# Patient Record
Sex: Female | Born: 1955 | Race: Black or African American | Hispanic: No | Marital: Married | Smoking: Never smoker
Health system: Southern US, Community
[De-identification: ages and names within clinical notes are randomized; demographics above are authoritative.]

## PROBLEM LIST (undated history)

## (undated) DIAGNOSIS — E119 Type 2 diabetes mellitus without complications: Secondary | ICD-10-CM

## (undated) DIAGNOSIS — D649 Anemia, unspecified: Secondary | ICD-10-CM

## (undated) DIAGNOSIS — F32A Depression, unspecified: Secondary | ICD-10-CM

## (undated) DIAGNOSIS — F419 Anxiety disorder, unspecified: Secondary | ICD-10-CM

## (undated) DIAGNOSIS — I10 Essential (primary) hypertension: Secondary | ICD-10-CM

## (undated) DIAGNOSIS — M1990 Unspecified osteoarthritis, unspecified site: Secondary | ICD-10-CM

## (undated) DIAGNOSIS — E7800 Pure hypercholesterolemia, unspecified: Secondary | ICD-10-CM

## (undated) DIAGNOSIS — K589 Irritable bowel syndrome without diarrhea: Secondary | ICD-10-CM

## (undated) HISTORY — DX: Anemia, unspecified: D64.9

## (undated) HISTORY — DX: Type 2 diabetes mellitus without complications: E11.9

## (undated) HISTORY — DX: Unspecified osteoarthritis, unspecified site: M19.90

## (undated) HISTORY — DX: Pure hypercholesterolemia, unspecified: E78.00

## (undated) HISTORY — DX: Depression, unspecified: F32.A

## (undated) HISTORY — DX: Essential (primary) hypertension: I10

## (undated) HISTORY — DX: Irritable bowel syndrome, unspecified: K58.9

## (undated) HISTORY — DX: Anxiety disorder, unspecified: F41.9

---

## 1994-01-19 HISTORY — PX: ABDOMINAL HYSTERECTOMY: SHX81

## 1998-02-20 ENCOUNTER — Other Ambulatory Visit: Admission: RE | Admit: 1998-02-20 | Discharge: 1998-02-20 | Payer: Self-pay | Admitting: Obstetrics

## 1998-02-28 ENCOUNTER — Ambulatory Visit (HOSPITAL_COMMUNITY): Admission: RE | Admit: 1998-02-28 | Discharge: 1998-02-28 | Payer: Self-pay | Admitting: *Deleted

## 1998-03-01 ENCOUNTER — Ambulatory Visit (HOSPITAL_COMMUNITY): Admission: RE | Admit: 1998-03-01 | Discharge: 1998-03-01 | Payer: Self-pay | Admitting: *Deleted

## 1998-04-02 ENCOUNTER — Inpatient Hospital Stay (HOSPITAL_COMMUNITY): Admission: RE | Admit: 1998-04-02 | Discharge: 1998-04-05 | Payer: Self-pay | Admitting: Obstetrics

## 1998-04-13 ENCOUNTER — Inpatient Hospital Stay (HOSPITAL_COMMUNITY): Admission: AD | Admit: 1998-04-13 | Discharge: 1998-04-15 | Payer: Self-pay | Admitting: Obstetrics

## 1998-04-14 ENCOUNTER — Encounter: Payer: Self-pay | Admitting: Obstetrics

## 1998-04-25 ENCOUNTER — Emergency Department (HOSPITAL_COMMUNITY): Admission: EM | Admit: 1998-04-25 | Discharge: 1998-04-25 | Payer: Self-pay | Admitting: Emergency Medicine

## 1998-04-25 ENCOUNTER — Encounter: Payer: Self-pay | Admitting: Emergency Medicine

## 2004-08-05 ENCOUNTER — Other Ambulatory Visit: Admission: RE | Admit: 2004-08-05 | Discharge: 2004-08-05 | Payer: Self-pay | Admitting: Obstetrics and Gynecology

## 2004-08-18 ENCOUNTER — Ambulatory Visit (HOSPITAL_COMMUNITY): Admission: RE | Admit: 2004-08-18 | Discharge: 2004-08-18 | Payer: Self-pay | Admitting: Obstetrics and Gynecology

## 2009-08-06 ENCOUNTER — Encounter: Admission: RE | Admit: 2009-08-06 | Discharge: 2009-08-06 | Payer: Self-pay | Admitting: Internal Medicine

## 2014-01-01 ENCOUNTER — Ambulatory Visit
Admission: RE | Admit: 2014-01-01 | Discharge: 2014-01-01 | Disposition: A | Discharge status: Home / Self Care | Payer: PRIVATE HEALTH INSURANCE | Source: Ambulatory Visit | Attending: Family | Admitting: Family

## 2014-01-01 ENCOUNTER — Other Ambulatory Visit: Payer: Self-pay | Admitting: Family

## 2014-01-01 DIAGNOSIS — M79672 Pain in left foot: Secondary | ICD-10-CM

## 2014-01-01 DIAGNOSIS — M79671 Pain in right foot: Secondary | ICD-10-CM

## 2014-03-30 ENCOUNTER — Other Ambulatory Visit: Payer: Self-pay

## 2014-03-30 DIAGNOSIS — Z1231 Encounter for screening mammogram for malignant neoplasm of breast: Secondary | ICD-10-CM

## 2014-04-16 ENCOUNTER — Ambulatory Visit
Admission: RE | Admit: 2014-04-16 | Discharge: 2014-04-16 | Disposition: A | Discharge status: Home / Self Care | Payer: PRIVATE HEALTH INSURANCE | Source: Ambulatory Visit

## 2014-04-16 DIAGNOSIS — Z1231 Encounter for screening mammogram for malignant neoplasm of breast: Secondary | ICD-10-CM

## 2016-07-30 ENCOUNTER — Other Ambulatory Visit: Payer: Self-pay | Admitting: Internal Medicine

## 2016-07-30 DIAGNOSIS — E2839 Other primary ovarian failure: Secondary | ICD-10-CM

## 2016-09-28 ENCOUNTER — Other Ambulatory Visit: Payer: Self-pay | Admitting: Internal Medicine

## 2016-09-28 DIAGNOSIS — Z1231 Encounter for screening mammogram for malignant neoplasm of breast: Secondary | ICD-10-CM

## 2016-10-13 ENCOUNTER — Ambulatory Visit
Admission: RE | Admit: 2016-10-13 | Discharge: 2016-10-13 | Disposition: A | Discharge status: Home / Self Care | Payer: PRIVATE HEALTH INSURANCE | Source: Ambulatory Visit | Attending: Internal Medicine | Admitting: Internal Medicine

## 2016-10-13 ENCOUNTER — Encounter: Payer: Self-pay | Admitting: Radiology

## 2016-10-13 DIAGNOSIS — Z1231 Encounter for screening mammogram for malignant neoplasm of breast: Secondary | ICD-10-CM

## 2016-10-13 DIAGNOSIS — E2839 Other primary ovarian failure: Secondary | ICD-10-CM

## 2016-11-10 LAB — LIPID PANEL
Cholesterol: 249 — AB (ref 0–200)
HDL: 74 — AB (ref 35–70)
LDL CALC: 156
LDL/HDL RATIO: 2.1
Triglycerides: 93 (ref 40–160)

## 2016-11-10 LAB — VITAMIN D 25 HYDROXY (VIT D DEFICIENCY, FRACTURES): VIT D 25 HYDROXY: 36.9

## 2016-11-10 LAB — HM HEPATITIS C SCREENING LAB: HM HEPATITIS C SCREENING: NEGATIVE

## 2017-08-16 LAB — BASIC METABOLIC PANEL
BUN: 15 (ref 4–21)
Creatinine: 0.9 (ref 0.5–1.1)
Glucose: 68
Potassium: 4.6 (ref 3.4–5.3)
SODIUM: 142 (ref 137–147)

## 2017-08-16 LAB — HEMOGLOBIN A1C: HEMOGLOBIN A1C: 6.4

## 2017-12-15 ENCOUNTER — Encounter: Payer: Self-pay | Admitting: Internal Medicine

## 2017-12-15 ENCOUNTER — Ambulatory Visit (INDEPENDENT_AMBULATORY_CARE_PROVIDER_SITE_OTHER): Payer: PRIVATE HEALTH INSURANCE | Admitting: Internal Medicine

## 2017-12-15 VITALS — BP 136/84 | HR 81 | Temp 98.1°F | Ht 61.75 in | Wt 193.4 lb

## 2017-12-15 DIAGNOSIS — Z6835 Body mass index (BMI) 35.0-35.9, adult: Secondary | ICD-10-CM

## 2017-12-15 DIAGNOSIS — E7800 Pure hypercholesterolemia, unspecified: Secondary | ICD-10-CM | POA: Diagnosis not present

## 2017-12-15 DIAGNOSIS — I10 Essential (primary) hypertension: Secondary | ICD-10-CM

## 2017-12-15 DIAGNOSIS — E1165 Type 2 diabetes mellitus with hyperglycemia: Secondary | ICD-10-CM | POA: Diagnosis not present

## 2017-12-16 LAB — LIPID PANEL
Chol/HDL Ratio: 3.2 ratio (ref 0.0–4.4)
Cholesterol, Total: 176 mg/dL (ref 100–199)
HDL: 55 mg/dL (ref >39)
LDL Calculated: 70 mg/dL (ref 0–99)
Triglycerides: 254 mg/dL — ABNORMAL HIGH (ref 0–149)
VLDL Cholesterol Cal: 51 mg/dL — ABNORMAL HIGH (ref 5–40)

## 2017-12-16 LAB — CMP14+EGFR
A/G RATIO: 1.7 (ref 1.2–2.2)
ALK PHOS: 75 IU/L (ref 39–117)
ALT: 20 IU/L (ref 0–32)
AST: 17 IU/L (ref 0–40)
Albumin: 4.5 g/dL (ref 3.6–4.8)
BILIRUBIN TOTAL: 0.4 mg/dL (ref 0.0–1.2)
BUN/Creatinine Ratio: 20 (ref 12–28)
BUN: 18 mg/dL (ref 8–27)
CHLORIDE: 105 mmol/L (ref 96–106)
CO2: 21 mmol/L (ref 20–29)
Calcium: 9.5 mg/dL (ref 8.7–10.3)
Creatinine, Ser: 0.89 mg/dL (ref 0.57–1.00)
GFR calc Af Amer: 79 mL/min/{1.73_m2} (ref >59)
GFR calc non Af Amer: 69 mL/min/{1.73_m2} (ref >59)
GLOBULIN, TOTAL: 2.7 g/dL (ref 1.5–4.5)
Glucose: 76 mg/dL (ref 65–99)
Potassium: 4.3 mmol/L (ref 3.5–5.2)
SODIUM: 145 mmol/L — AB (ref 134–144)
Total Protein: 7.2 g/dL (ref 6.0–8.5)

## 2017-12-16 LAB — HEMOGLOBIN A1C
ESTIMATED AVERAGE GLUCOSE: 131 mg/dL
HEMOGLOBIN A1C: 6.2 % — AB (ref 4.8–5.6)

## 2017-12-18 NOTE — Progress Notes (Signed)
Your liver and kidney function are stable. Your sodium level is sl. Elevated, this implies that you are not drinking enough water. Your hba1c is 6.2, this is great.  Your triglycerides are elevated, this implies that you are NOT exercising enough and eating too many processed foods. pls cut out deli meat, cut back on sausage and bacon and exercise 30 minutes no less than five days weekly

## 2017-12-19 ENCOUNTER — Encounter: Payer: Self-pay | Admitting: Internal Medicine

## 2017-12-19 NOTE — Progress Notes (Signed)
Subjective:     Patient ID: Whitney Sanchez , female    DOB: 07-12-1955 , 62 y.o.   MRN: [id number]   Chief Complaint  Patient presents with  . Diabetes  . Hypertension    HPI  Diabetes  She presents for her follow-up diabetic visit. She has type 2 diabetes mellitus. Her disease course has been stable. There are no hypoglycemic associated symptoms. There are no diabetic associated symptoms. Pertinent negatives for diabetes include no blurred vision and no chest pain. There are no hypoglycemic complications. There are no diabetic complications. Risk factors for coronary artery disease include diabetes mellitus, dyslipidemia, hypertension, obesity, sedentary lifestyle and post-menopausal. Current diabetic treatment includes oral agent (monotherapy).  Hypertension  This is a chronic problem. The current episode started more than 1 year ago. The problem has been gradually improving since onset. The problem is controlled. Pertinent negatives include no blurred vision, chest pain, palpitations or shortness of breath.   She reports compliance with medication.   History reviewed. No pertinent past medical history.   Family History  Problem Relation Age of Onset  . Diabetes Mother   . Breast cancer Neg Hx      Current Outpatient Medications:  .  LIVALO 4 MG TABS, , Disp: , Rfl: 5 .  losartan (COZAAR) 100 MG tablet, , Disp: , Rfl: 1 .  OZEMPIC, 0.25 OR 0.5 MG/DOSE, 2 MG/1.5ML SOPN, , Disp: , Rfl: 1   No Known Allergies   Review of Systems  Constitutional: Negative.   Eyes: Negative for blurred vision.  Respiratory: Negative.  Negative for shortness of breath.   Cardiovascular: Negative.  Negative for chest pain and palpitations.  Gastrointestinal: Negative.   Genitourinary: Negative.   Neurological: Negative.   Psychiatric/Behavioral: Negative.      Today's Vitals   12/15/17 1411  BP: 136/84  Pulse: 81  Temp: 98.1 F (36.7 C)  TempSrc: Oral  Weight: 193 lb 6.4 oz (87.7  kg)  Height: 5' 1.75" (1.568 m)   Body mass index is 35.66 kg/m.   Objective:  Physical Exam  Constitutional: She is oriented to person, place, and time. She appears well-developed and well-nourished.  HENT:  Head: Normocephalic and atraumatic.  Eyes: EOM are normal.  Cardiovascular: Normal rate, regular rhythm and normal heart sounds.  Pulmonary/Chest: Effort normal and breath sounds normal.  Neurological: She is alert and oriented to person, place, and time.  Psychiatric: She has a normal mood and affect.  Nursing note and vitals reviewed.       Assessment And Plan:     1. Uncontrolled type 2 diabetes mellitus with hyperglycemia ([place])  She will continue with Ozempic once weekly. I will consider increasing her dose to 22m once weekly after reviewing her lab results. She is encouraged to avoid sugary beverages and processed foods.   - OZEMPIC, 0.25 OR 0.5 MG/DOSE, 2 MG/1.5ML SOPN; Refill: 1 - CMP14+EGFR - Hemoglobin A1c  2. Essential hypertension, benign  Fair control. She will continue with current meds for now. If elevated at next visit, I will consider adding hctz to her current regimen. She is encouraged to avoid adding salt to her foods.   - losartan (COZAAR) 100 MG tablet; Refill: 1  3. Class 2 severe obesity due to excess calories with serious comorbidity and body mass index (BMI) of 35.0 to 35.9 in adult ([facility]  She is encouraged to strive for BMI less than 30 to decrease cardiac risk. She is encouraged to exercise no less than  four to five days weekly for 30 minutes.   4. Pure hypercholesterolemia  Chronic. She has not tolerated other statins in the past. She was given refill of Livalo.   - LIVALO 4 MG TABS; Refill: 5       [name], MD

## 2017-12-31 ENCOUNTER — Telehealth: Payer: Self-pay

## 2017-12-31 NOTE — Telephone Encounter (Signed)
-----   Message from Dorothyann Pengobyn Sanders, MD sent at 12/18/2017 12:03 PM EST ----- Your liver and kidney function are stable. Your sodium level is sl. Elevated, this implies that you are not drinking enough water. Your hba1c is 6.2, this is great.  Your triglycerides are elevated, this implies that you are NOT exercising enough and eating too many processed foods. pls cut out deli meat, cut back on sausage and bacon and exercise 30 minutes no less than five days weekly

## 2017-12-31 NOTE — Telephone Encounter (Signed)
Left the pt a message to call back for her lab results. 

## 2018-03-07 ENCOUNTER — Telehealth: Payer: Self-pay | Admitting: Internal Medicine

## 2018-03-07 NOTE — Telephone Encounter (Signed)
[place] APPROVED FOR LIVALO 4MG  TABLET-KEY#[id number]

## 2018-03-17 ENCOUNTER — Ambulatory Visit: Payer: PRIVATE HEALTH INSURANCE | Admitting: Internal Medicine

## 2018-03-17 ENCOUNTER — Encounter: Payer: Self-pay | Admitting: Internal Medicine

## 2018-03-17 VITALS — BP 154/80 | HR 86 | Temp 97.9°F | Ht 62.8 in | Wt 192.8 lb

## 2018-03-17 DIAGNOSIS — E7800 Pure hypercholesterolemia, unspecified: Secondary | ICD-10-CM | POA: Insufficient documentation

## 2018-03-17 DIAGNOSIS — E6609 Other obesity due to excess calories: Secondary | ICD-10-CM | POA: Insufficient documentation

## 2018-03-17 DIAGNOSIS — I10 Essential (primary) hypertension: Secondary | ICD-10-CM | POA: Insufficient documentation

## 2018-03-17 DIAGNOSIS — N760 Acute vaginitis: Secondary | ICD-10-CM

## 2018-03-17 DIAGNOSIS — E66811 Obesity, class 1: Secondary | ICD-10-CM | POA: Insufficient documentation

## 2018-03-17 DIAGNOSIS — E1165 Type 2 diabetes mellitus with hyperglycemia: Secondary | ICD-10-CM | POA: Diagnosis not present

## 2018-03-17 DIAGNOSIS — Z6835 Body mass index (BMI) 35.0-35.9, adult: Secondary | ICD-10-CM

## 2018-03-17 DIAGNOSIS — F419 Anxiety disorder, unspecified: Secondary | ICD-10-CM

## 2018-03-17 DIAGNOSIS — Z6834 Body mass index (BMI) 34.0-34.9, adult: Secondary | ICD-10-CM | POA: Insufficient documentation

## 2018-03-17 MED ORDER — SERTRALINE HCL 25 MG PO TABS: ORAL_TABLET | ORAL | 0 refills | Status: DC

## 2018-03-17 MED ORDER — FLUCONAZOLE 150 MG PO TABS: 150.0000 mg | ORAL_TABLET | Freq: Every day | ORAL | 0 refills | Status: DC

## 2018-03-17 MED ORDER — SEMAGLUTIDE (1 MG/DOSE) 2 MG/1.5ML ~~LOC~~ SOPN: PEN_INJECTOR | SUBCUTANEOUS | 3 refills | Status: DC

## 2018-03-17 NOTE — Patient Instructions (Signed)
Diabetes Mellitus and Exercise Exercising regularly is important for your overall health, especially when you have diabetes (diabetes mellitus). Exercising is not only about losing weight. It has many other health benefits, such as increasing muscle strength and bone density and reducing body fat and stress. This leads to improved fitness, flexibility, and endurance, all of which result in better overall health. Exercise has additional benefits for people with diabetes, including:  Reducing appetite.  Helping to lower and control blood glucose.  Lowering blood pressure.  Helping to control amounts of fatty substances (lipids) in the blood, such as cholesterol and triglycerides.  Helping the body to respond better to insulin (improving insulin sensitivity).  Reducing how much insulin the body needs.  Decreasing the risk for heart disease by: ? Lowering cholesterol and triglyceride levels. ? Increasing the levels of good cholesterol. ? Lowering blood glucose levels. What is my activity plan? Your health care provider or certified diabetes educator can help you make a plan for the type and frequency of exercise (activity plan) that works for you. Make sure that you:  Do at least 150 minutes of moderate-intensity or vigorous-intensity exercise each week. This could be brisk walking, biking, or water aerobics. ? Do stretching and strength exercises, such as yoga or weightlifting, at least 2 times a week. ? Spread out your activity over at least 3 days of the week.  Get some form of physical activity every day. ? Do not go more than 2 days in a row without some kind of physical activity. ? Avoid being inactive for more than 30 minutes at a time. Take frequent breaks to walk or stretch.  Choose a type of exercise or activity that you enjoy, and set realistic goals.  Start slowly, and gradually increase the intensity of your exercise over time. What do I need to know about managing my  diabetes?   Check your blood glucose before and after exercising. ? If your blood glucose is 240 mg/dL (13.3 mmol/L) or higher before you exercise, check your urine for ketones. If you have ketones in your urine, do not exercise until your blood glucose returns to normal. ? If your blood glucose is 100 mg/dL (5.6 mmol/L) or lower, eat a snack containing 15-20 grams of carbohydrate. Check your blood glucose 15 minutes after the snack to make sure that your level is above 100 mg/dL (5.6 mmol/L) before you start your exercise.  Know the symptoms of low blood glucose (hypoglycemia) and how to treat it. Your risk for hypoglycemia increases during and after exercise. Common symptoms of hypoglycemia can include: ? Hunger. ? Anxiety. ? Sweating and feeling clammy. ? Confusion. ? Dizziness or feeling light-headed. ? Increased heart rate or palpitations. ? Blurry vision. ? Tingling or numbness around the mouth, lips, or tongue. ? Tremors or shakes. ? Irritability.  Keep a rapid-acting carbohydrate snack available before, during, and after exercise to help prevent or treat hypoglycemia.  Avoid injecting insulin into areas of the body that are going to be exercised. For example, avoid injecting insulin into: ? The arms, when playing tennis. ? The legs, when jogging.  Keep records of your exercise habits. Doing this can help you and your health care provider adjust your diabetes management plan as needed. Write down: ? Food that you eat before and after you exercise. ? Blood glucose levels before and after you exercise. ? The type and amount of exercise you have done. ? When your insulin is expected to peak, if you use   insulin. Avoid exercising at times when your insulin is peaking.  When you start a new exercise or activity, work with your health care provider to make sure the activity is safe for you, and to adjust your insulin, medicines, or food intake as needed.  Drink plenty of water while  you exercise to prevent dehydration or heat stroke. Drink enough fluid to keep your urine clear or pale yellow. Summary  Exercising regularly is important for your overall health, especially when you have diabetes (diabetes mellitus).  Exercising has many health benefits, such as increasing muscle strength and bone density and reducing body fat and stress.  Your health care provider or certified diabetes educator can help you make a plan for the type and frequency of exercise (activity plan) that works for you.  When you start a new exercise or activity, work with your health care provider to make sure the activity is safe for you, and to adjust your insulin, medicines, or food intake as needed. This information is not intended to replace advice given to you by your health care provider. Make sure you discuss any questions you have with your health care provider. Document Released: 03/28/2003 Document Revised: 07/16/2016 Document Reviewed: 06/17/2015 Elsevier Interactive Patient Education  2019 Elsevier Inc.  

## 2018-03-17 NOTE — Progress Notes (Signed)
Subjective:     Patient ID: Whitney Sanchez , female    DOB: 03-08-1955 , 63 y.o.   MRN: [id number]   Chief Complaint  Patient presents with  . Diabetes    HPI  Diabetes  She presents for her follow-up diabetic visit. She has type 2 diabetes mellitus. Hypoglycemia symptoms include nervousness/anxiousness. Pertinent negatives for diabetes include no blurred vision and no chest pain. There are no hypoglycemic complications. Risk factors for coronary artery disease include diabetes mellitus, dyslipidemia, hypertension, sedentary lifestyle, post-menopausal and obesity. An ACE inhibitor/angiotensin II receptor blocker is being taken.  Hypertension  This is a chronic problem. The current episode started more than 1 year ago. The problem has been gradually improving since onset. The problem is uncontrolled. Pertinent negatives include no blurred vision or chest pain.  She has yet to take her meds today. She has not eaten.    Past Medical History:  Diagnosis Date  . DM (diabetes mellitus) ([place])   . High cholesterol   . HTN (hypertension)      Family History  Problem Relation Age of Onset  . Diabetes Mother   . Breast cancer Neg Hx      Current Outpatient Medications:  .  LIVALO 4 MG TABS, , Disp: , Rfl: 5 .  losartan (COZAAR) 100 MG tablet, , Disp: , Rfl: 1 .  OZEMPIC, 0.25 OR 0.5 MG/DOSE, 2 MG/1.5ML SOPN, , Disp: , Rfl: 1 .  fluconazole (DIFLUCAN) 150 MG tablet, Take 1 tablet (150 mg total) by mouth daily., Disp: 1 tablet, Rfl: 0   No Known Allergies   Review of Systems  Constitutional: Negative.   Eyes: Negative for blurred vision.  Respiratory: Negative.   Cardiovascular: Negative.  Negative for chest pain.  Gastrointestinal: Negative.   Genitourinary: Positive for vaginal discharge (she c/o vaginal itch. tried otc meds, no relief. clear d/c).  Neurological: Negative.   Psychiatric/Behavioral: The patient is nervous/anxious.      Today's Vitals   03/17/18 1052  BP:  (!) 154/80  Pulse: 86  Temp: 97.9 F (36.6 C)  TempSrc: Oral  Weight: 192 lb 12.8 oz (87.5 kg)  Height: 5' 2.8" (1.595 m)  PainSc: 0-No pain   Body mass index is 34.37 kg/m.   Objective:  Physical Exam Vitals signs and nursing note reviewed.  Constitutional:      Appearance: Normal appearance.  HENT:     Head: Normocephalic and atraumatic.  Cardiovascular:     Rate and Rhythm: Normal rate and regular rhythm.     Heart sounds: Normal heart sounds.  Pulmonary:     Effort: Pulmonary effort is normal.     Breath sounds: Normal breath sounds.  Skin:    General: Skin is warm.  Neurological:     General: No focal deficit present.     Mental Status: She is alert.  Psychiatric:        Mood and Affect: Mood normal.        Behavior: Behavior normal.         Assessment And Plan:     1. Uncontrolled type 2 diabetes mellitus with hyperglycemia (HCC)  I will increase her Ozempic to '1mg'$  once weekly. She will continue to inject on Mondays.  - CMP14+EGFR - Hemoglobin A1c  2. Essential hypertension, benign  Uncontrolled. She has yet to take her meds today. She has not eaten. She will continue with current meds.   3. Pure hypercholesterolemia  She was given samples of Livalo '4mg'$  to  take once daily. Again, importance of regular exercise was discussed with the patient.   4. Acute vaginitis  She was given rx fluconazole 150m po x 1.   5. Anxiety  I will start her with zoloft 249mx 7 days, then she will increase to 2 tablets daily. She is advised to take with her evening meal. Possible side effects were discussed with the patient. She will rto in four weeks for re-evaluation.   6. Class 2 severe obesity due to excess calories with serious comorbidity and body mass index (BMI) of 35.0 to 35.9 in adult [facility] Importance of achieving optimal weight to decrease risk of cardiovascular disease and cancers was discussed with the patient in full detail. She is encouraged to start  slowly - start with 10 minutes twice daily at least three to four days per week and to gradually build to 30 minutes five days weekly. She was given tips to incorporate more activity into her daily routine - take stairs when possible, park farther away from her job, grocery stores, etc.    RoMaximino GreenlandMD

## 2018-03-18 ENCOUNTER — Telehealth: Payer: Self-pay

## 2018-03-18 LAB — CMP14+EGFR
ALBUMIN: 4.8 g/dL (ref 3.8–4.8)
ALK PHOS: 89 IU/L (ref 39–117)
ALT: 19 IU/L (ref 0–32)
AST: 19 IU/L (ref 0–40)
Albumin/Globulin Ratio: 1.8 (ref 1.2–2.2)
BUN / CREAT RATIO: 16 (ref 12–28)
BUN: 14 mg/dL (ref 8–27)
Bilirubin Total: 0.4 mg/dL (ref 0.0–1.2)
CALCIUM: 9.9 mg/dL (ref 8.7–10.3)
CO2: 18 mmol/L — AB (ref 20–29)
Chloride: 106 mmol/L (ref 96–106)
Creatinine, Ser: 0.86 mg/dL (ref 0.57–1.00)
GFR, EST AFRICAN AMERICAN: 83 mL/min/{1.73_m2} (ref >59)
GFR, EST NON AFRICAN AMERICAN: 72 mL/min/{1.73_m2} (ref >59)
GLOBULIN, TOTAL: 2.7 g/dL (ref 1.5–4.5)
Glucose: 101 mg/dL — ABNORMAL HIGH (ref 65–99)
Potassium: 4.4 mmol/L (ref 3.5–5.2)
SODIUM: 143 mmol/L (ref 134–144)
TOTAL PROTEIN: 7.5 g/dL (ref 6.0–8.5)

## 2018-03-18 LAB — HEMOGLOBIN A1C
Est. average glucose Bld gHb Est-mCnc: 126 mg/dL
Hgb A1c MFr Bld: 6 % — ABNORMAL HIGH (ref 4.8–5.6)

## 2018-03-18 NOTE — Telephone Encounter (Signed)
I returned the pt's call to see what medication her pharmacy said that's not covered under her insurance and to let her know that the livalo is approved for coverage.

## 2018-03-21 ENCOUNTER — Other Ambulatory Visit: Payer: Self-pay

## 2018-03-21 MED ORDER — SERTRALINE HCL 25 MG PO TABS: ORAL_TABLET | ORAL | 0 refills | Status: DC

## 2018-03-22 ENCOUNTER — Other Ambulatory Visit: Payer: Self-pay

## 2018-03-22 MED ORDER — SERTRALINE HCL 25 MG PO TABS: ORAL_TABLET | ORAL | 0 refills | Status: DC

## 2018-03-23 ENCOUNTER — Other Ambulatory Visit: Payer: Self-pay | Admitting: Internal Medicine

## 2018-03-23 DIAGNOSIS — I10 Essential (primary) hypertension: Secondary | ICD-10-CM

## 2018-03-25 ENCOUNTER — Telehealth: Payer: Self-pay

## 2018-03-25 NOTE — Telephone Encounter (Signed)
-----   Message from [name], MD sent at 03/19/2018  2:33 PM EST ----- Your liver and kidney fxn are stable. Your hba1c is great. 6.0. down from 6.2.

## 2018-03-25 NOTE — Telephone Encounter (Signed)
No answer, I was calling the pt with her lab results.

## 2018-04-02 ENCOUNTER — Other Ambulatory Visit: Payer: Self-pay | Admitting: Internal Medicine

## 2018-04-02 DIAGNOSIS — E7800 Pure hypercholesterolemia, unspecified: Secondary | ICD-10-CM

## 2018-04-14 ENCOUNTER — Encounter: Payer: Self-pay | Admitting: Internal Medicine

## 2018-04-14 ENCOUNTER — Ambulatory Visit (INDEPENDENT_AMBULATORY_CARE_PROVIDER_SITE_OTHER): Payer: PRIVATE HEALTH INSURANCE | Admitting: Internal Medicine

## 2018-04-14 ENCOUNTER — Other Ambulatory Visit: Payer: Self-pay

## 2018-04-14 VITALS — BP 118/76 | HR 77 | Temp 98.7°F | Ht 62.8 in | Wt 190.2 lb

## 2018-04-14 DIAGNOSIS — E6609 Other obesity due to excess calories: Secondary | ICD-10-CM | POA: Diagnosis not present

## 2018-04-14 DIAGNOSIS — F419 Anxiety disorder, unspecified: Secondary | ICD-10-CM

## 2018-04-14 DIAGNOSIS — Z6833 Body mass index (BMI) 33.0-33.9, adult: Secondary | ICD-10-CM | POA: Diagnosis not present

## 2018-04-14 DIAGNOSIS — E1165 Type 2 diabetes mellitus with hyperglycemia: Secondary | ICD-10-CM

## 2018-04-14 MED ORDER — SERTRALINE HCL 25 MG PO TABS: ORAL_TABLET | ORAL | 0 refills | Status: DC

## 2018-04-14 MED ORDER — ASPIRIN EC 81 MG PO TBEC: 81.0000 mg | DELAYED_RELEASE_TABLET | Freq: Every day | ORAL | 3 refills | Status: AC

## 2018-04-14 NOTE — Progress Notes (Signed)
Subjective:     Patient ID: Whitney Sanchez , female    DOB: 1955-09-10 , 63 y.o.   MRN: [id number]   Chief Complaint  Patient presents with  . Anxiety    HPI  Anxiety  Presents for follow-up visit. Patient reports no chest pain, feeling of choking, muscle tension, palpitations or shortness of breath.     She was started on sertraline 25mg  daily at her last visit. Unfortunately, she states she has been taking as needed.  She admits she did not read the bottle.   Past Medical History:  Diagnosis Date  . DM (diabetes mellitus) ([facility])   . High cholesterol   . HTN (hypertension)      Family History  Problem Relation Age of Onset  . Diabetes Mother   . Healthy Father   . Breast cancer Neg Hx      Current Outpatient Medications:  .  LIVALO 4 MG TABS, TAKE ONE TABLET BY MOUTH DAILY, Disp: 30 tablet, Rfl: 4 .  losartan (COZAAR) 100 MG tablet, TAKE ONE TABLET BY MOUTH DAILY, Disp: 30 tablet, Rfl: 0 .  Semaglutide, 1 MG/DOSE, (OZEMPIC, 1 MG/DOSE,) 2 MG/1.5ML SOPN, Inject 1 mg into the skin once a week for 30 days, THEN 1 mg once a week for 30 days., Disp: 1 pen, Rfl: 3 .  sertraline (ZOLOFT) 25 MG tablet, Take 1/2 tablet by mouth for 7 days and then 1 tablet by mouth daily, Disp: 60 tablet, Rfl: 0 .  aspirin EC 81 MG tablet, Take 1 tablet (81 mg total) by mouth daily., Disp: 90 tablet, Rfl: 3   No Known Allergies   Review of Systems  Constitutional: Negative.   Respiratory: Negative for shortness of breath.   Cardiovascular: Negative.  Negative for chest pain and palpitations.  Gastrointestinal: Negative.   Neurological: Negative.   Psychiatric/Behavioral: Negative.      Today's Vitals   04/14/18 1553  BP: 118/76  Pulse: 77  Temp: 98.7 F (37.1 C)  TempSrc: Oral  Weight: 190 lb 3.2 oz (86.3 kg)  Height: 5' 2.8" (1.595 m)   Body mass index is 33.91 kg/m.   Objective:  Physical Exam Vitals signs and nursing note reviewed.  Constitutional:      Appearance:  Normal appearance.  HENT:     Head: Normocephalic and atraumatic.  Cardiovascular:     Rate and Rhythm: Normal rate and regular rhythm.     Heart sounds: Normal heart sounds.  Pulmonary:     Effort: Pulmonary effort is normal.     Breath sounds: Normal breath sounds.  Skin:    General: Skin is warm.  Neurological:     General: No focal deficit present.     Mental Status: She is alert.  Psychiatric:        Mood and Affect: Mood normal.        Behavior: Behavior normal.         Assessment And Plan:     1. Anxiety  I reviewed the directions of sertraline with her in full detail. She will take one tablet daily for now. She will rto in 2 months for re-evaluation.   2. Uncontrolled type 2 diabetes mellitus with hyperglycemia (HCC)  She will continue with Ozempic, 1mg  once weekly.  Importance of regular exercise was discussed with the patient.     3. Class 1 obesity due to excess calories with serious comorbidity and body mass index (BMI) of 33.0 to 33.9 in adult  She was  advised of 2 pound weight loss. She is encouraged to strive for BMI less than 30 to decrease cardiac risk. She is encouraged to exercise 30 minutes five days weekly.   Gwynneth Aliment, MD

## 2018-04-22 ENCOUNTER — Other Ambulatory Visit: Payer: Self-pay | Admitting: Internal Medicine

## 2018-04-22 DIAGNOSIS — I10 Essential (primary) hypertension: Secondary | ICD-10-CM

## 2018-05-21 ENCOUNTER — Other Ambulatory Visit: Payer: Self-pay | Admitting: Internal Medicine

## 2018-05-21 DIAGNOSIS — I10 Essential (primary) hypertension: Secondary | ICD-10-CM

## 2018-06-01 ENCOUNTER — Ambulatory Visit: Payer: Self-pay | Admitting: Internal Medicine

## 2018-06-16 ENCOUNTER — Ambulatory Visit: Payer: Self-pay | Admitting: Internal Medicine

## 2018-06-20 ENCOUNTER — Encounter: Payer: Self-pay | Admitting: Internal Medicine

## 2018-06-20 ENCOUNTER — Other Ambulatory Visit: Payer: Self-pay

## 2018-06-20 ENCOUNTER — Ambulatory Visit: Payer: PRIVATE HEALTH INSURANCE | Admitting: Internal Medicine

## 2018-06-20 VITALS — BP 112/84 | HR 80 | Temp 98.5°F | Ht 62.8 in | Wt 187.8 lb

## 2018-06-20 DIAGNOSIS — I10 Essential (primary) hypertension: Secondary | ICD-10-CM | POA: Diagnosis not present

## 2018-06-20 DIAGNOSIS — E1165 Type 2 diabetes mellitus with hyperglycemia: Secondary | ICD-10-CM

## 2018-06-20 DIAGNOSIS — Z6833 Body mass index (BMI) 33.0-33.9, adult: Secondary | ICD-10-CM | POA: Diagnosis not present

## 2018-06-20 DIAGNOSIS — E6609 Other obesity due to excess calories: Secondary | ICD-10-CM | POA: Diagnosis not present

## 2018-06-20 NOTE — Progress Notes (Signed)
Subjective:     Patient ID: Whitney Sanchez , female    DOB: 1955/02/07 , 63 y.o.   MRN: [id number]   Chief Complaint  Patient presents with  . Diabetes  . Hypertension    HPI  Diabetes  She presents for her follow-up diabetic visit. She has type 2 diabetes mellitus. Hypoglycemia symptoms include nervousness/anxiousness. Pertinent negatives for diabetes include no blurred vision and no chest pain. There are no hypoglycemic complications. Risk factors for coronary artery disease include diabetes mellitus, dyslipidemia, hypertension, sedentary lifestyle, post-menopausal and obesity. An ACE inhibitor/angiotensin II receptor blocker is being taken.  Hypertension  This is a chronic problem. The current episode started more than 1 year ago. The problem has been gradually improving since onset. The problem is uncontrolled. Pertinent negatives include no blurred vision or chest pain. Risk factors for coronary artery disease include dyslipidemia and diabetes mellitus. The current treatment provides moderate improvement.     Past Medical History:  Diagnosis Date  . DM (diabetes mellitus) ([place])   . High cholesterol   . HTN (hypertension)      Family History  Problem Relation Age of Onset  . Diabetes Mother   . Healthy Father   . Breast cancer Neg Hx      Current Outpatient Medications:  .  aspirin EC 81 MG tablet, Take 1 tablet (81 mg total) by mouth daily., Disp: 90 tablet, Rfl: 3 .  LIVALO 4 MG TABS, TAKE ONE TABLET BY MOUTH DAILY, Disp: 30 tablet, Rfl: 4 .  losartan (COZAAR) 100 MG tablet, TAKE ONE TABLET BY MOUTH DAILY, Disp: 30 tablet, Rfl: 0 .  sertraline (ZOLOFT) 25 MG tablet, TAKE ONE-HALF BY MOUTH FOR 7 DAYS AND THEN ONE TABLET BY MOUTH DAILY THEREAFTER, Disp: 30 tablet, Rfl: 0   No Known Allergies   Review of Systems  Constitutional: Negative.   Eyes: Negative for blurred vision.  Respiratory: Negative.   Cardiovascular: Negative.  Negative for chest pain.   Gastrointestinal: Negative.   Neurological: Negative.   Psychiatric/Behavioral: The patient is nervous/anxious.      Today's Vitals   06/20/18 1522  BP: 112/84  Pulse: 80  Temp: 98.5 F (36.9 C)  TempSrc: Oral  Weight: 187 lb 12.8 oz (85.2 kg)  Height: 5' 2.8" (1.595 m)   Body mass index is 33.48 kg/m.   Objective:  Physical Exam Vitals signs and nursing note reviewed.  Constitutional:      Appearance: Normal appearance.  HENT:     Head: Normocephalic and atraumatic.  Cardiovascular:     Rate and Rhythm: Normal rate and regular rhythm.     Heart sounds: Normal heart sounds.  Pulmonary:     Effort: Pulmonary effort is normal.     Breath sounds: Normal breath sounds.  Skin:    General: Skin is warm.  Neurological:     General: No focal deficit present.     Mental Status: She is alert.  Psychiatric:        Mood and Affect: Mood normal.        Behavior: Behavior normal.         Assessment And Plan:     1. Uncontrolled type 2 diabetes mellitus with hyperglycemia ([place])  I will check labs as listed below.  Importance of regular exercise was discussed with the patient. She was congratulated on her weight loss thus far and encouraged to lose another 7 pounds before her next visit in 3 months. She is encouraged to aim for  exercising five days per week.   - BMP8+EGFR - Hemoglobin A1c  2. Essential hypertension, benign  Well controlled. She will continue with current meds. She is encouraged to avoid adding salt to her foods.   3. Class 1 obesity due to excess calories with serious comorbidity and body mass index (BMI) of 33.0 to 33.9 in adult  Importance of achieving optimal weight to decrease risk of cardiovascular disease and cancers was discussed with the patient in full detail. She is encouraged to start slowly - start with 10 minutes twice daily at least three to four days per week and to gradually build to 30 minutes five days weekly. She was given tips to  incorporate more activity into her daily routine - take stairs when possible, park farther away from her job, grocery stores, etc.    [name], MD    THE PATIENT IS ENCOURAGED TO PRACTICE SOCIAL DISTANCING DUE TO THE COVID-19 PANDEMIC.

## 2018-06-20 NOTE — Patient Instructions (Signed)

## 2018-06-21 LAB — BMP8+EGFR
BUN/Creatinine Ratio: 24 (ref 12–28)
BUN: 18 mg/dL (ref 8–27)
CO2: 23 mmol/L (ref 20–29)
Calcium: 9.4 mg/dL (ref 8.7–10.3)
Chloride: 106 mmol/L (ref 96–106)
Creatinine, Ser: 0.74 mg/dL (ref 0.57–1.00)
GFR calc Af Amer: 98 mL/min/{1.73_m2} (ref >59)
GFR calc non Af Amer: 85 mL/min/{1.73_m2} (ref >59)
Glucose: 82 mg/dL (ref 65–99)
Potassium: 3.8 mmol/L (ref 3.5–5.2)
Sodium: 142 mmol/L (ref 134–144)

## 2018-06-21 LAB — HEMOGLOBIN A1C
Est. average glucose Bld gHb Est-mCnc: 123 mg/dL
Hgb A1c MFr Bld: 5.9 % — ABNORMAL HIGH (ref 4.8–5.6)

## 2018-06-24 ENCOUNTER — Telehealth: Payer: Self-pay

## 2018-06-24 NOTE — Telephone Encounter (Signed)
-----   Message from [name], MD sent at 06/21/2018 12:52 PM EDT ----- Kidney fxn is nl. hba1c is 5.9, this has improved since last visit .please increase exercise

## 2018-06-24 NOTE — Telephone Encounter (Signed)
Left the patient a message to call back for lab results. 

## 2018-07-01 ENCOUNTER — Other Ambulatory Visit: Payer: Self-pay | Admitting: Internal Medicine

## 2018-07-01 DIAGNOSIS — I10 Essential (primary) hypertension: Secondary | ICD-10-CM

## 2018-07-26 ENCOUNTER — Other Ambulatory Visit: Payer: Self-pay | Admitting: Internal Medicine

## 2018-07-26 DIAGNOSIS — I10 Essential (primary) hypertension: Secondary | ICD-10-CM

## 2018-08-23 ENCOUNTER — Other Ambulatory Visit: Payer: Self-pay | Admitting: Internal Medicine

## 2018-08-23 DIAGNOSIS — I10 Essential (primary) hypertension: Secondary | ICD-10-CM

## 2018-08-30 ENCOUNTER — Encounter: Payer: PRIVATE HEALTH INSURANCE | Admitting: Internal Medicine

## 2018-10-10 ENCOUNTER — Other Ambulatory Visit: Payer: Self-pay

## 2018-10-10 ENCOUNTER — Encounter: Payer: Self-pay | Admitting: Internal Medicine

## 2018-10-10 ENCOUNTER — Ambulatory Visit: Payer: PRIVATE HEALTH INSURANCE | Admitting: Internal Medicine

## 2018-10-10 VITALS — BP 138/76 | HR 94 | Temp 98.3°F | Ht 61.6 in | Wt 179.8 lb

## 2018-10-10 DIAGNOSIS — I10 Essential (primary) hypertension: Secondary | ICD-10-CM

## 2018-10-10 DIAGNOSIS — E1165 Type 2 diabetes mellitus with hyperglycemia: Secondary | ICD-10-CM | POA: Diagnosis not present

## 2018-10-10 DIAGNOSIS — R9431 Abnormal electrocardiogram [ECG] [EKG]: Secondary | ICD-10-CM

## 2018-10-10 DIAGNOSIS — E6609 Other obesity due to excess calories: Secondary | ICD-10-CM

## 2018-10-10 DIAGNOSIS — I454 Nonspecific intraventricular block: Secondary | ICD-10-CM

## 2018-10-10 DIAGNOSIS — Z0001 Encounter for general adult medical examination with abnormal findings: Secondary | ICD-10-CM

## 2018-10-10 DIAGNOSIS — Z6833 Body mass index (BMI) 33.0-33.9, adult: Secondary | ICD-10-CM

## 2018-10-10 DIAGNOSIS — Z0000 Encounter for general adult medical examination without abnormal findings: Secondary | ICD-10-CM

## 2018-10-10 LAB — POCT URINALYSIS DIPSTICK
Bilirubin, UA: NEGATIVE
Glucose, UA: NEGATIVE
Ketones, UA: NEGATIVE
Nitrite, UA: NEGATIVE
Protein, UA: NEGATIVE
Spec Grav, UA: >=1.03 — AB (ref 1.010–1.025)
Urobilinogen, UA: 1 E.U./dL
pH, UA: 6 (ref 5.0–8.0)

## 2018-10-10 LAB — POCT UA - MICROALBUMIN
Albumin/Creatinine Ratio, Urine, POC: 30
Creatinine, POC: 300 mg/dL
Microalbumin Ur, POC: 10 mg/L

## 2018-10-10 NOTE — Patient Instructions (Signed)
Magnesium 400mg  once daily   Health Maintenance, Female Adopting a healthy lifestyle and getting preventive care are important in promoting health and wellness. Ask your health care provider about:  The right schedule for you to have regular tests and exams.  Things you can do on your own to prevent diseases and keep yourself healthy. What should I know about diet, weight, and exercise? Eat a healthy diet   Eat a diet that includes plenty of vegetables, fruits, low-fat dairy products, and lean protein.  Do not eat a lot of foods that are high in solid fats, added sugars, or sodium. Maintain a healthy weight Body mass index (BMI) is used to identify weight problems. It estimates body fat based on height and weight. Your health care provider can help determine your BMI and help you achieve or maintain a healthy weight. Get regular exercise Get regular exercise. This is one of the most important things you can do for your health. Most adults should:  Exercise for at least 150 minutes each week. The exercise should increase your heart rate and make you sweat (moderate-intensity exercise).  Do strengthening exercises at least twice a week. This is in addition to the moderate-intensity exercise.  Spend less time sitting. Even light physical activity can be beneficial. Watch cholesterol and blood lipids Have your blood tested for lipids and cholesterol at 65 years of age, then have this test every 5 years. Have your cholesterol levels checked more often if:  Your lipid or cholesterol levels are high.  You are older than 65 years of age.  You are at high risk for heart disease. What should I know about cancer screening? Depending on your health history and family history, you may need to have cancer screening at various ages. This may include screening for:  Breast cancer.  Cervical cancer.  Colorectal cancer.  Skin cancer.  Lung cancer. What should I know about heart disease,  diabetes, and high blood pressure? Blood pressure and heart disease  High blood pressure causes heart disease and increases the risk of stroke. This is more likely to develop in people who have high blood pressure readings, are of African descent, or are overweight.  Have your blood pressure checked: ? Every 3-5 years if you are 28-45 years of age. ? Every year if you are 62 years old or older. Diabetes Have regular diabetes screenings. This checks your fasting blood sugar level. Have the screening done:  Once every three years after age 85 if you are at a normal weight and have a low risk for diabetes.  More often and at a younger age if you are overweight or have a high risk for diabetes. What should I know about preventing infection? Hepatitis B If you have a higher risk for hepatitis B, you should be screened for this virus. Talk with your health care provider to find out if you are at risk for hepatitis B infection. Hepatitis C Testing is recommended for:  Everyone born from [birth year] through 1967.  Anyone with known risk factors for hepatitis C. Sexually transmitted infections (STIs)  Get screened for STIs, including gonorrhea and chlamydia, if: ? You are sexually active and are younger than 65 years of age. ? You are older than 65 years of age and your health care provider tells you that you are at risk for this type of infection. ? Your sexual activity has changed since you were last screened, and you are at increased risk for chlamydia or gonorrhea.  Ask your health care provider if you are at risk.  Ask your health care provider about whether you are at high risk for HIV. Your health care provider may recommend a prescription medicine to help prevent HIV infection. If you choose to take medicine to prevent HIV, you should first get tested for HIV. You should then be tested every 3 months for as long as you are taking the medicine. Pregnancy  If you are about to stop having your  period (premenopausal) and you may become pregnant, seek counseling before you get pregnant.  Take 400 to 800 micrograms (mcg) of folic acid every day if you become pregnant.  Ask for birth control (contraception) if you want to prevent pregnancy. Osteoporosis and menopause Osteoporosis is a disease in which the bones lose minerals and strength with aging. This can result in bone fractures. If you are 29 years old or older, or if you are at risk for osteoporosis and fractures, ask your health care provider if you should:  Be screened for bone loss.  Take a calcium or vitamin D supplement to lower your risk of fractures.  Be given hormone replacement therapy (HRT) to treat symptoms of menopause. Follow these instructions at home: Lifestyle  Do not use any products that contain nicotine or tobacco, such as cigarettes, e-cigarettes, and chewing tobacco. If you need help quitting, ask your health care provider.  Do not use street drugs.  Do not share needles.  Ask your health care provider for help if you need support or information about quitting drugs. Alcohol use  Do not drink alcohol if: ? Your health care provider tells you not to drink. ? You are pregnant, may be pregnant, or are planning to become pregnant.  If you drink alcohol: ? Limit how much you use to 0-1 drink a day. ? Limit intake if you are breastfeeding.  Be aware of how much alcohol is in your drink. In the [place] one drink equals one 12 oz bottle of beer (355 mL), one 5 oz glass of wine (148 mL), or one 1 oz glass of hard liquor (44 mL). General instructions  Schedule regular health, dental, and eye exams.  Stay current with your vaccines.  Tell your health care provider if: ? You often feel depressed. ? You have ever been abused or do not feel safe at home. Summary  Adopting a healthy lifestyle and getting preventive care are important in promoting health and wellness.  Follow your health care provider's  instructions about healthy diet, exercising, and getting tested or screened for diseases.  Follow your health care provider's instructions on monitoring your cholesterol and blood pressure. This information is not intended to replace advice given to you by your health care provider. Make sure you discuss any questions you have with your health care provider. Document Released: 07/21/2010 Document Revised: 12/29/2017 Document Reviewed: 12/29/2017 Elsevier Patient Education  2020 [employer].

## 2018-10-10 NOTE — Progress Notes (Signed)
Subjective:     Patient ID: Whitney Sanchez , female    DOB: 1955-03-13 , 63 y.o.   MRN: [id number]   Chief Complaint  Patient presents with  . Annual Exam  . Diabetes  . Hypertension    HPI  She is here today for a full physical examination. She is no longer followed by GYN.  She has no specific concerns or complaints at this time.   Diabetes She presents for her follow-up diabetic visit. She has type 2 diabetes mellitus. Pertinent negatives for diabetes include no blurred vision and no chest pain. There are no hypoglycemic complications. Risk factors for coronary artery disease include diabetes mellitus, dyslipidemia, hypertension, sedentary lifestyle, post-menopausal and obesity. An ACE inhibitor/angiotensin II receptor blocker is being taken. Eye exam is not current.  Hypertension This is a chronic problem. The current episode started more than 1 year ago. The problem has been gradually improving since onset. The problem is uncontrolled. Pertinent negatives include no blurred vision or chest pain. Risk factors for coronary artery disease include dyslipidemia and diabetes mellitus. The current treatment provides moderate improvement. Compliance problems include exercise.      Past Medical History:  Diagnosis Date  . DM (diabetes mellitus) ([place])   . High cholesterol   . HTN (hypertension)      Family History  Problem Relation Age of Onset  . Diabetes Mother   . Healthy Father   . Breast cancer Neg Hx      Current Outpatient Medications:  .  aspirin EC 81 MG tablet, Take 1 tablet (81 mg total) by mouth daily., Disp: 90 tablet, Rfl: 3 .  LIVALO 4 MG TABS, TAKE ONE TABLET BY MOUTH DAILY, Disp: 30 tablet, Rfl: 4 .  losartan (COZAAR) 100 MG tablet, TAKE ONE TABLET BY MOUTH DAILY, Disp: 90 tablet, Rfl: 1 .  OZEMPIC, 1 MG/DOSE, 2 MG/1.5ML SOPN, DIAL AND INJECT SUBCUTANEOUSLY 1 MG WEEKLY, Disp: 3 pen, Rfl: 2 .  sertraline (ZOLOFT) 25 MG tablet, TAKE ONE TABLET BY MOUTH DAILY,  Disp: 90 tablet, Rfl: 1   No Known Allergies   Review of Systems  Constitutional: Negative.   HENT: Negative.   Eyes: Negative.  Negative for blurred vision.  Respiratory: Negative.   Cardiovascular: Negative.  Negative for chest pain.  Endocrine: Negative.   Genitourinary: Negative.   Musculoskeletal: Negative.   Skin: Negative.   Allergic/Immunologic: Negative.   Neurological: Negative.   Hematological: Negative.   Psychiatric/Behavioral: Negative.      Today's Vitals   10/10/18 1426  BP: 138/76  Pulse: 94  Temp: 98.3 F (36.8 C)  TempSrc: Oral  SpO2: 98%  Weight: 179 lb 12.8 oz (81.6 kg)  Height: 5' 1.6" (1.565 m)   Body mass index is 33.31 kg/m.   Objective:  Physical Exam Vitals signs and nursing note reviewed.  Constitutional:      Appearance: Normal appearance. She is obese.  HENT:     Head: Normocephalic and atraumatic.     Right Ear: Tympanic membrane, ear canal and external ear normal.     Left Ear: Tympanic membrane, ear canal and external ear normal.     Nose: Nose normal.     Mouth/Throat:     Mouth: Mucous membranes are moist.     Pharynx: Oropharynx is clear.  Eyes:     Extraocular Movements: Extraocular movements intact.     Conjunctiva/sclera: Conjunctivae normal.     Pupils: Pupils are equal, round, and reactive to light.  Neck:  Musculoskeletal: Normal range of motion and neck supple.  Cardiovascular:     Rate and Rhythm: Normal rate and regular rhythm.     Pulses: Normal pulses.          Dorsalis pedis pulses are 2+ on the right side and 2+ on the left side.       Posterior tibial pulses are 2+ on the right side and 2+ on the left side.     Heart sounds: Normal heart sounds.  Pulmonary:     Effort: Pulmonary effort is normal.     Breath sounds: Normal breath sounds.  Chest:     Breasts: Tanner Score is 5.        Right: Normal. No swelling, bleeding, inverted nipple, mass, nipple discharge or skin change.        Left: Normal. No  swelling, bleeding, inverted nipple, mass, nipple discharge or skin change.  Abdominal:     General: Abdomen is flat. Bowel sounds are normal.     Palpations: Abdomen is soft.  Genitourinary:    Comments: deferred Musculoskeletal: Normal range of motion.  Feet:     Right foot:     Protective Sensation: 5 sites tested. 5 sites sensed.     Skin integrity: Skin integrity normal.     Toenail Condition: Right toenails are normal.     Left foot:     Protective Sensation: 5 sites tested. 5 sites sensed.     Skin integrity: Skin integrity normal.     Toenail Condition: Left toenails are normal.  Skin:    General: Skin is warm and dry.  Neurological:     General: No focal deficit present.     Mental Status: She is alert and oriented to person, place, and time.  Psychiatric:        Mood and Affect: Mood normal.        Behavior: Behavior normal.         Assessment And Plan:     1. Routine general medical examination at health care facility  A full exam was performed.  Importance of monthly self breast exams was discussed with the patient.  PATIENT HAS BEEN ADVISED TO GET 30-45 MINUTES REGULAR EXERCISE NO LESS THAN FOUR TO FIVE DAYS PER WEEK - BOTH WEIGHTBEARING EXERCISES AND AEROBIC ARE RECOMMENDED.  SHE WAS ADVISED TO FOLLOW A HEALTHY DIET WITH AT LEAST SIX FRUITS/VEGGIES PER DAY, DECREASE INTAKE OF RED MEAT, AND TO INCREASE FISH INTAKE TO TWO DAYS PER WEEK.  MEATS/FISH SHOULD NOT BE FRIED, BAKED OR BROILED IS PREFERABLE.  I SUGGEST WEARING SPF 50 SUNSCREEN ON EXPOSED PARTS AND ESPECIALLY WHEN IN THE DIRECT SUNLIGHT FOR AN EXTENDED PERIOD OF TIME.  PLEASE AVOID FAST FOOD RESTAURANTS AND INCREASE YOUR WATER INTAKE.  - CMP14+EGFR - CBC - Lipid panel - Hemoglobin A1c - TSH  2. Uncontrolled type 2 diabetes mellitus with hyperglycemia (HCC)  Diabetic foot exam was performed. I DISCUSSED WITH THE PATIENT AT LENGTH REGARDING THE GOALS OF GLYCEMIC CONTROL AND POSSIBLE LONG-TERM COMPLICATIONS.   I  ALSO STRESSED THE IMPORTANCE OF COMPLIANCE WITH HOME GLUCOSE MONITORING, DIETARY RESTRICTIONS INCLUDING AVOIDANCE OF SUGARY DRINKS/PROCESSED FOODS,  ALONG WITH REGULAR EXERCISE.  I  ALSO STRESSED THE IMPORTANCE OF ANNUAL EYE EXAMS, SELF FOOT CARE AND COMPLIANCE WITH OFFICE VISITS.   3. Essential hypertension, benign  Chronic, fair control. She will continue with current meds for now. She is encouraged to incorporate more exercise into her daily routine.  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   - EKG 12-Lead - POCT Urinalysis Dipstick (81002) - POCT UA - Microalbumin  4. 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 She denies cp, sob, doe and palpitations. She is encouraged to notify me ASAP if she develops any of these symptoms.    5. Class 1 obesity due to excess calories with serious comorbidity and body mass index (BMI) of 33.0 to 33.9 in adult  Importance of achieving optimal weight to decrease risk of cardiovascular disease and cancers was discussed with the patient in full detail. Importance of regular exercise was discussed with the patient.  She is encouraged to start slowly - start with 10 minutes twice daily at least three to four days per week and to gradually build to 30 minutes five days weekly. She was given tips to incorporate more activity into her daily routine - take stairs when possible, park farther away from her job, grocery stores, etc.     [name], MD    THE PATIENT IS ENCOURAGED TO PRACTICE SOCIAL DISTANCING DUE TO THE COVID-19 PANDEMIC.

## 2018-10-11 LAB — LIPID PANEL
Chol/HDL Ratio: 4.3 ratio (ref 0.0–4.4)
Cholesterol, Total: 208 mg/dL — ABNORMAL HIGH (ref 100–199)
HDL: 48 mg/dL (ref >39)
LDL Chol Calc (NIH): 115 mg/dL — ABNORMAL HIGH (ref 0–99)
Triglycerides: 261 mg/dL — ABNORMAL HIGH (ref 0–149)
VLDL Cholesterol Cal: 45 mg/dL — ABNORMAL HIGH (ref 5–40)

## 2018-10-11 LAB — CMP14+EGFR
ALT: 15 IU/L (ref 0–32)
AST: 15 IU/L (ref 0–40)
Albumin/Globulin Ratio: 1.4 (ref 1.2–2.2)
Albumin: 4 g/dL (ref 3.8–4.8)
Alkaline Phosphatase: 83 IU/L (ref 39–117)
BUN/Creatinine Ratio: 24 (ref 12–28)
BUN: 20 mg/dL (ref 8–27)
Bilirubin Total: 0.3 mg/dL (ref 0.0–1.2)
CO2: 24 mmol/L (ref 20–29)
Calcium: 9.3 mg/dL (ref 8.7–10.3)
Chloride: 107 mmol/L — ABNORMAL HIGH (ref 96–106)
Creatinine, Ser: 0.83 mg/dL (ref 0.57–1.00)
GFR calc Af Amer: 86 mL/min/{1.73_m2} (ref >59)
GFR calc non Af Amer: 74 mL/min/{1.73_m2} (ref >59)
Globulin, Total: 2.8 g/dL (ref 1.5–4.5)
Glucose: 95 mg/dL (ref 65–99)
Potassium: 4.3 mmol/L (ref 3.5–5.2)
Sodium: 143 mmol/L (ref 134–144)
Total Protein: 6.8 g/dL (ref 6.0–8.5)

## 2018-10-11 LAB — CBC
Hematocrit: 34.6 % (ref 34.0–46.6)
Hemoglobin: 11.1 g/dL (ref 11.1–15.9)
MCH: 27.5 pg (ref 26.6–33.0)
MCHC: 32.1 g/dL (ref 31.5–35.7)
MCV: 86 fL (ref 79–97)
Platelets: 284 10*3/uL (ref 150–450)
RBC: 4.03 x10E6/uL (ref 3.77–5.28)
RDW: 14.1 % (ref 11.7–15.4)
WBC: 8.2 10*3/uL (ref 3.4–10.8)

## 2018-10-11 LAB — HEMOGLOBIN A1C
Est. average glucose Bld gHb Est-mCnc: 120 mg/dL
Hgb A1c MFr Bld: 5.8 % — ABNORMAL HIGH (ref 4.8–5.6)

## 2018-10-12 ENCOUNTER — Telehealth: Payer: Self-pay

## 2018-10-12 NOTE — Telephone Encounter (Signed)
1st attempt to give lab results  

## 2018-10-12 NOTE — Telephone Encounter (Signed)
-----   Message from Glendale Chard, MD sent at 10/12/2018  8:28 AM EDT ----- Your blood count, liver and kidney fxn are nl. Your LDL, bad chol is 115. As a diabetic, it should be less than 70. Additionally, your triglycerides are quite elevated. This tells me that you do not exercise regularly, and that you eat too much sugar and/or processed foods. Please remove sugary beverages, including diet drinks from your diet. Please cut out deli meat, lunch meat and white breads, rice and pasta.  Your hba1c is great.

## 2018-11-23 ENCOUNTER — Other Ambulatory Visit: Payer: Self-pay | Admitting: Internal Medicine

## 2019-01-09 ENCOUNTER — Ambulatory Visit: Payer: PRIVATE HEALTH INSURANCE | Admitting: Internal Medicine

## 2019-02-09 ENCOUNTER — Other Ambulatory Visit: Payer: Self-pay

## 2019-02-09 ENCOUNTER — Ambulatory Visit: Payer: PRIVATE HEALTH INSURANCE | Admitting: Internal Medicine

## 2019-02-09 ENCOUNTER — Encounter: Payer: Self-pay | Admitting: Internal Medicine

## 2019-02-09 VITALS — BP 118/74 | HR 94 | Temp 98.4°F | Ht 61.6 in | Wt 182.0 lb

## 2019-02-09 DIAGNOSIS — E1165 Type 2 diabetes mellitus with hyperglycemia: Secondary | ICD-10-CM | POA: Diagnosis not present

## 2019-02-09 DIAGNOSIS — E6609 Other obesity due to excess calories: Secondary | ICD-10-CM | POA: Diagnosis not present

## 2019-02-09 DIAGNOSIS — L84 Corns and callosities: Secondary | ICD-10-CM | POA: Diagnosis not present

## 2019-02-09 DIAGNOSIS — I10 Essential (primary) hypertension: Secondary | ICD-10-CM

## 2019-02-09 DIAGNOSIS — Z6833 Body mass index (BMI) 33.0-33.9, adult: Secondary | ICD-10-CM

## 2019-02-09 DIAGNOSIS — Z1231 Encounter for screening mammogram for malignant neoplasm of breast: Secondary | ICD-10-CM

## 2019-02-09 NOTE — Progress Notes (Signed)
This visit occurred during the SARS-CoV-2 public health emergency.  Safety protocols were in place, including screening questions prior to the visit, additional usage of staff PPE, and extensive cleaning of exam room while observing appropriate contact time as indicated for disinfecting solutions.  Subjective:     Patient ID: Whitney Sanchez , female    DOB: 12-24-1955 , 64 y.o.   MRN: [id number]   Chief Complaint  Patient presents with  . Diabetes  . Hypertension    HPI  She is here today for a diabetes check. She reports getting her first COVID vaccine.  Diabetes She presents for her follow-up diabetic visit. She has type 2 diabetes mellitus. Pertinent negatives for diabetes include no blurred vision and no chest pain. There are no hypoglycemic complications. Risk factors for coronary artery disease include diabetes mellitus, dyslipidemia, hypertension, sedentary lifestyle, post-menopausal and obesity. An ACE inhibitor/angiotensin II receptor blocker is being taken.  Hypertension This is a chronic problem. The current episode started more than 1 year ago. The problem has been gradually improving since onset. The problem is uncontrolled. Pertinent negatives include no blurred vision or chest pain. Risk factors for coronary artery disease include dyslipidemia and diabetes mellitus. The current treatment provides moderate improvement.     Past Medical History:  Diagnosis Date  . DM (diabetes mellitus) ([place])   . High cholesterol   . HTN (hypertension)      Family History  Problem Relation Age of Onset  . Diabetes Mother   . Healthy Father   . Breast cancer Neg Hx      Current Outpatient Medications:  .  aspirin EC 81 MG tablet, Take 1 tablet (81 mg total) by mouth daily., Disp: 90 tablet, Rfl: 3 .  LIVALO 4 MG TABS, TAKE ONE TABLET BY MOUTH DAILY, Disp: 30 tablet, Rfl: 4 .  losartan (COZAAR) 100 MG tablet, TAKE ONE TABLET BY MOUTH DAILY, Disp: 90 tablet, Rfl: 1 .  OZEMPIC,  1 MG/DOSE, 2 MG/1.5ML SOPN, DIAL AND INJECT UNDER THE SKIN 1 MG WEEKLY, Disp: 4 pen, Rfl: 2 .  sertraline (ZOLOFT) 25 MG tablet, TAKE ONE TABLET BY MOUTH DAILY, Disp: 90 tablet, Rfl: 1   No Known Allergies   Review of Systems  Constitutional: Negative.   Eyes: Negative for blurred vision.  Respiratory: Negative.   Cardiovascular: Negative.  Negative for chest pain.  Gastrointestinal: Negative.   Musculoskeletal:       She c/o r foot pain. Sx started about a month ago. She denies fall/traums. She reports it hurts on the lateral part of her foot. Described as a sharp/shooting pain. She tried rubbing some balm on it, no relief.   Neurological: Negative.      Today's Vitals   02/09/19 1039  BP: 118/74  Pulse: 94  Temp: 98.4 F (36.9 C)  TempSrc: Oral  Weight: 182 lb (82.6 kg)  Height: 5' 1.6" (1.565 m)  PainSc: 0-No pain   Body mass index is 33.72 kg/m.   Objective:  Physical Exam Vitals and nursing note reviewed.  Constitutional:      Appearance: Normal appearance.  HENT:     Head: Normocephalic and atraumatic.  Cardiovascular:     Rate and Rhythm: Normal rate and regular rhythm.     Heart sounds: Normal heart sounds.  Pulmonary:     Effort: Pulmonary effort is normal.     Breath sounds: Normal breath sounds.  Feet:     Right foot:     Skin integrity: Callus  and dry skin present.  Skin:    General: Skin is warm.  Neurological:     General: No focal deficit present.     Mental Status: She is alert.  Psychiatric:        Mood and Affect: Mood normal.        Behavior: Behavior normal.         Assessment And Plan:     1. Uncontrolled type 2 diabetes mellitus with hyperglycemia (HCC)  Chronic, this has been stable. I will check labs as listed below.  I will make medication adjustments once her labs are available for review. She is encouraged to exercise 30 minutes five days weekly.   - BMP8+EGFR - Hemoglobin A1c  2. Essential hypertension, benign  Chronic,  well controlled. She will continue with current meds. She is encouraged to avoid adding salt to her foods.   3. Class 1 obesity due to excess calories with serious comorbidity and body mass index (BMI) of 33.0 to 33.9 in adult  She is encouraged to strive to lose 10 pounds to decrease cardiac risk. She is advised that exercise - 30 minutes five days weekly, will help her reach this goal. She is also encouraged to avoid sugary beverages and packaged foods, when possible.   4. Encounter for screening mammogram for malignant neoplasm of breast  She agrees to mammography referral.   - MM Digital Screening; Future  5. Callus of foot  She is advised to moisturize her skin/feet daily. I will also refer her to Podiatry for further evaluation/diabetic foot exam. Based on the location of the callus, she is advised that she may benefit from Orthotics. She is in agreement with her treatment plan.   - Ambulatory referral to Podiatry   [name], MD    THE PATIENT IS ENCOURAGED TO PRACTICE SOCIAL DISTANCING DUE TO THE COVID-19 PANDEMIC.

## 2019-02-09 NOTE — Patient Instructions (Signed)

## 2019-02-10 LAB — HEMOGLOBIN A1C
Est. average glucose Bld gHb Est-mCnc: 120 mg/dL
Hgb A1c MFr Bld: 5.8 % — ABNORMAL HIGH (ref 4.8–5.6)

## 2019-02-10 LAB — BMP8+EGFR
BUN/Creatinine Ratio: 25 (ref 12–28)
BUN: 17 mg/dL (ref 8–27)
CO2: 24 mmol/L (ref 20–29)
Calcium: 9.6 mg/dL (ref 8.7–10.3)
Chloride: 104 mmol/L (ref 96–106)
Creatinine, Ser: 0.68 mg/dL (ref 0.57–1.00)
GFR calc Af Amer: 106 mL/min/{1.73_m2} (ref >59)
GFR calc non Af Amer: 92 mL/min/{1.73_m2} (ref >59)
Glucose: 89 mg/dL (ref 65–99)
Potassium: 4.6 mmol/L (ref 3.5–5.2)
Sodium: 140 mmol/L (ref 134–144)

## 2019-02-14 ENCOUNTER — Ambulatory Visit (INDEPENDENT_AMBULATORY_CARE_PROVIDER_SITE_OTHER): Payer: PRIVATE HEALTH INSURANCE | Admitting: Podiatry

## 2019-02-14 ENCOUNTER — Encounter: Payer: Self-pay | Admitting: Podiatry

## 2019-02-14 ENCOUNTER — Other Ambulatory Visit: Payer: Self-pay

## 2019-02-14 VITALS — BP 158/91 | HR 83

## 2019-02-14 DIAGNOSIS — M79674 Pain in right toe(s): Secondary | ICD-10-CM

## 2019-02-14 DIAGNOSIS — Q828 Other specified congenital malformations of skin: Secondary | ICD-10-CM

## 2019-02-14 DIAGNOSIS — B351 Tinea unguium: Secondary | ICD-10-CM

## 2019-02-14 DIAGNOSIS — M79675 Pain in left toe(s): Secondary | ICD-10-CM

## 2019-02-14 DIAGNOSIS — E1165 Type 2 diabetes mellitus with hyperglycemia: Secondary | ICD-10-CM

## 2019-02-14 DIAGNOSIS — E119 Type 2 diabetes mellitus without complications: Secondary | ICD-10-CM

## 2019-02-14 NOTE — Patient Instructions (Addendum)
Diabetes Mellitus and Foot Care Foot care is an important part of your health, especially when you have diabetes. Diabetes may cause you to have problems because of poor blood flow (circulation) to your feet and legs, which can cause your skin to:  Become thinner and drier.  Break more easily.  Heal more slowly.  Peel and crack. You may also have nerve damage (neuropathy) in your legs and feet, causing decreased feeling in them. This means that you may not notice minor injuries to your feet that could lead to more serious problems. Noticing and addressing any potential problems early is the best way to prevent future foot problems. How to care for your feet Foot hygiene  Wash your feet daily with warm water and mild soap. Do not use hot water. Then, pat your feet and the areas between your toes until they are completely dry. Do not soak your feet as this can dry your skin.  Trim your toenails straight across. Do not dig under them or around the cuticle. File the edges of your nails with an emery board or nail file.  Apply a moisturizing lotion or petroleum jelly to the skin on your feet and to dry, brittle toenails. Use lotion that does not contain alcohol and is unscented. Do not apply lotion between your toes. Shoes and socks  Wear clean socks or stockings every day. Make sure they are not too tight. Do not wear knee-high stockings since they may decrease blood flow to your legs.  Wear shoes that fit properly and have enough cushioning. Always look in your shoes before you put them on to be sure there are no objects inside.  To break in new shoes, wear them for just a few hours a day. This prevents injuries on your feet. Wounds, scrapes, corns, and calluses  Check your feet daily for blisters, cuts, bruises, sores, and redness. If you cannot see the bottom of your feet, use a mirror or ask someone for help.  Do not cut corns or calluses or try to remove them with medicine.  If you  find a minor scrape, cut, or break in the skin on your feet, keep it and the skin around it clean and dry. You may clean these areas with mild soap and water. Do not clean the area with peroxide, alcohol, or iodine.  If you have a wound, scrape, corn, or callus on your foot, look at it several times a day to make sure it is healing and not infected. Check for: ? Redness, swelling, or pain. ? Fluid or blood. ? Warmth. ? Pus or a bad smell. General instructions  Do not cross your legs. This may decrease blood flow to your feet.  Do not use heating pads or hot water bottles on your feet. They may burn your skin. If you have lost feeling in your feet or legs, you may not know this is happening until it is too late.  Protect your feet from hot and cold by wearing shoes, such as at the beach or on hot pavement.  Schedule a complete foot exam at least once a year (annually) or more often if you have foot problems. If you have foot problems, report any cuts, sores, or bruises to your health care provider immediately. Contact a health care provider if:  You have a medical condition that increases your risk of infection and you have any cuts, sores, or bruises on your feet.  You have an injury that is not   healing.  You have redness on your legs or feet.  You feel burning or tingling in your legs or feet.  You have pain or cramps in your legs and feet.  Your legs or feet are numb.  Your feet always feel cold.  You have pain around a toenail. Get help right away if:  You have a wound, scrape, corn, or callus on your foot and: ? You have pain, swelling, or redness that gets worse. ? You have fluid or blood coming from the wound, scrape, corn, or callus. ? Your wound, scrape, corn, or callus feels warm to the touch. ? You have pus or a bad smell coming from the wound, scrape, corn, or callus. ? You have a fever. ? You have a red line going up your leg. Summary  Check your feet every day  for cuts, sores, red spots, swelling, and blisters.  Moisturize feet and legs daily.  Wear shoes that fit properly and have enough cushioning.  If you have foot problems, report any cuts, sores, or bruises to your health care provider immediately.  Schedule a complete foot exam at least once a year (annually) or more often if you have foot problems. This information is not intended to replace advice given to you by your health care provider. Make sure you discuss any questions you have with your health care provider. Document Revised: 09/28/2018 Document Reviewed: 02/07/2016 Elsevier Patient Education  2020 Else[employer] Inc[employer]   Hammer Toe  Hammer toe is a change in the shape (a deformity) of your toe. The deformity causes the middle joint of your toe to stay bent. This causes pain, especially when you are wearing shoes. Hammer toe starts gradually. At first, the toe can be straightened. Gradually over time, the deformity becomes stiff and permanent. Early treatments to keep the toe straight may relieve pain. As the deformity becomes stiff and permanent, surgery may be needed to straighten the toe. What are the causes? Hammer toe is caused by abnormal bending of the toe joint that is closest to your foot. It happens gradually over time. This pulls on the muscles and connections (tendons) of the toe joint, making them weak and stiff. It is often related to wearing shoes that are too short or narrow and do not let your toes straighten. What increases the risk? You may be at greater risk for hammer toe if you:  Are female.  Are older.  Wear shoes that are too small.  Wear high-heeled shoes that pinch your toes.  Are a ballet dancer.  Have a second toe that is longer than your big toe (first toe).  Injure your foot or toe.  Have arthritis.  Have a family history of hammer toe.  Have a nerve or muscle disorder. What are the signs or symptoms? The main symptoms of this condition are  pain and deformity of the toe. The pain is worse when wearing shoes, walking, or running. Other symptoms may include:  Corns or calluses over the bent part of the toe or between the toes.  Redness and a burning feeling on the toe.  An open sore that forms on the top of the toe.  Not being able to straighten the toe. How is this diagnosed? This condition is diagnosed based on your symptoms and a physical exam. During the exam, your health care provider will try to straighten your toe to see how stiff the deformity is. You may also have tests, such as:  A blood test   to check for rheumatoid arthritis.  An X-ray to show how severe the deformity is. How is this treated? Treatment for this condition will depend on how stiff the deformity is. Surgery is often needed. However, sometimes a hammer toe can be straightened without surgery. Treatments that do not involve surgery include:  Taping the toe into a straightened position.  Using pads and cushions to protect the toe (orthotics).  Wearing shoes that provide enough room for the toes.  Doing toe-stretching exercises at home.  Taking an NSAID to reduce pain and swelling. If these treatments do not help or the toe cannot be straightened, surgery is the next option. The most common surgeries used to straighten a hammer toe include:  Arthroplasty. In this procedure, part of the joint is removed, and that allows the toe to straighten.  Fusion. In this procedure, cartilage between the two bones of the joint is taken out and the bones are fused together into one longer bone.  Implantation. In this procedure, part of the bone is removed and replaced with an implant to let the toe move again.  Flexor tendon transfer. In this procedure, the tendons that curl the toes down (flexor tendons) are repositioned. Follow these instructions at home:  Take over-the-counter and prescription medicines only as told by your health care provider.  Do toe  straightening and stretching exercises as told by your health care provider.  Keep all follow-up visits as told by your health care provider. This is important. How is this prevented?  Wear shoes that give your toes enough room and do not cause pain.  Do not wear high-heeled shoes. Contact a health care provider if:  Your pain gets worse.  Your toe becomes red or swollen.  You develop an open sore on your toe. This information is not intended to replace advice given to you by your health care provider. Make sure you discuss any questions you have with your health care provider. Document Revised: 12/18/2016 Document Reviewed: 05/01/2015 Elsevier Patient Education  2020 Else[employer] Inc.   Corns and Calluses Corns are small areas of thickened skin that occur on the top, sides, or tip of a toe. They contain a cone-shaped core with a point that can press on a nerve below. This causes pain.  Calluses are areas of thickened skin that can occur anywhere on the body, including the hands, fingers, palms, soles of the feet, and heels. Calluses are usually larger than corns. What are the causes? Corns and calluses are caused by rubbing (friction) or pressure, such as from shoes that are too tight or do not fit properly. What increases the risk? Corns are more likely to develop in people who have misshapen toes (toe deformities), such as hammer toes. Calluses can occur with friction to any area of the skin. They are more likely to develop in people who:  Work with their hands.  Wear shoes that fit poorly, are too tight, or are high-heeled.  Have toe deformities. What are the signs or symptoms? Symptoms of a corn or callus include:  A hard growth on the skin.  Pain or tenderness under the skin.  Redness and swelling.  Increased discomfort while wearing tight-fitting shoes, if your feet are affected. If a corn or callus becomes infected, symptoms may include:  Redness and swelling that  gets worse.  Pain.  Fluid, blood, or pus draining from the corn or callus. How is this diagnosed? Corns and calluses may be diagnosed based on your symptoms, your   medical history, and a physical exam. How is this treated? Treatment for corns and calluses may include:  Removing the cause of the friction or pressure. This may involve: ? Changing your shoes. ? Wearing shoe inserts (orthotics) or other protective layers in your shoes, such as a corn pad. ? Wearing gloves.  Applying medicine to the skin (topical medicine) to help soften skin in the hardened, thickened areas.  Removing layers of dead skin with a file to reduce the size of the corn or callus.  Removing the corn or callus with a scalpel or laser.  Taking antibiotic medicines, if your corn or callus is infected.  Having surgery, if a toe deformity is the cause. Follow these instructions at home:   Take over-the-counter and prescription medicines only as told by your health care provider.  If you were prescribed an antibiotic, take it as told by your health care provider. Do not stop taking it even if your condition starts to improve.  Wear shoes that fit well. Avoid wearing high-heeled shoes and shoes that are too tight or too loose.  Wear any padding, protective layers, gloves, or orthotics as told by your health care provider.  Soak your hands or feet and then use a file or pumice stone to soften your corn or callus. Do this as told by your health care provider.  Check your corn or callus every day for symptoms of infection. Contact a health care provider if you:  Notice that your symptoms do not improve with treatment.  Have redness or swelling that gets worse.  Notice that your corn or callus becomes painful.  Have fluid, blood, or pus coming from your corn or callus.  Have new symptoms. Summary  Corns are small areas of thickened skin that occur on the top, sides, or tip of a toe.  Calluses are areas  of thickened skin that can occur anywhere on the body, including the hands, fingers, palms, and soles of the feet. Calluses are usually larger than corns.  Corns and calluses are caused by rubbing (friction) or pressure, such as from shoes that are too tight or do not fit properly.  Treatment may include wearing any padding, protective layers, gloves, or orthotics as told by your health care provider. This information is not intended to replace advice given to you by your health care provider. Make sure you discuss any questions you have with your health care provider. Document Revised: 04/27/2018 Document Reviewed: 11/18/2016 Elsevier Patient Education  2020 Elsevier Inc.   

## 2019-02-15 ENCOUNTER — Telehealth: Payer: Self-pay

## 2019-02-15 NOTE — Telephone Encounter (Signed)
Left the patient a message to call back for lab results. 

## 2019-02-15 NOTE — Telephone Encounter (Signed)
-----   Message from [name], MD sent at 02/11/2019  9:49 AM EST ----- Kidney fxn is nl. Your hba1c is 5.8, this is stable. Be sure to exercise at least 30 minutes five days per week.

## 2019-02-16 ENCOUNTER — Telehealth: Payer: Self-pay

## 2019-02-16 NOTE — Telephone Encounter (Signed)
Detailed message let on pt's voicemail at the pt's request.

## 2019-02-16 NOTE — Telephone Encounter (Signed)
-----   Message from Robyn Sanders, MD sent at 02/11/2019  9:49 AM EST ----- Kidney fxn is nl. Your hba1c is 5.8, this is stable. Be sure to exercise at least 30 minutes five days per week.  

## 2019-02-18 NOTE — Progress Notes (Signed)
Subjective: Whitney Sanchez presents today referred by Glendale Chard, MD for complaint of for diabetic foot evaluation and painful mycotic nails b/l that are difficult to trim. Pain interferes with ambulation. Aggravating factors include wearing enclosed shoe gear. Pain is relieved with periodic professional debridement.   Past Medical History:  Diagnosis Date  . DM (diabetes mellitus) ([place])   . High cholesterol   . HTN (hypertension)      Patient Active Problem List   Diagnosis Date Noted  . Uncontrolled type 2 diabetes mellitus with hyperglycemia ([place]) 03/17/2018  . Essential hypertension, benign 03/17/2018  . Pure hypercholesterolemia 03/17/2018     Past Surgical History:  Procedure Laterality Date  . ABDOMINAL HYSTERECTOMY  1996   total     Current Outpatient Medications on File Prior to Visit  Medication Sig Dispense Refill  . aspirin EC 81 MG tablet Take 1 tablet (81 mg total) by mouth daily. 90 tablet 3  . LIVALO 4 MG TABS TAKE ONE TABLET BY MOUTH DAILY 30 tablet 4  . losartan (COZAAR) 100 MG tablet TAKE ONE TABLET BY MOUTH DAILY 90 tablet 1  . OZEMPIC, 1 MG/DOSE, 2 MG/1.5ML SOPN DIAL AND INJECT UNDER THE SKIN 1 MG WEEKLY 4 pen 2  . sertraline (ZOLOFT) 25 MG tablet TAKE ONE TABLET BY MOUTH DAILY 90 tablet 1   No current facility-administered medications on file prior to visit.     No Known Allergies   Social History   Occupational History  . Not on file  Tobacco Use  . Smoking status: Never Smoker  . Smokeless tobacco: Never Used  Substance and Sexual Activity  . Alcohol use: Never  . Drug use: Never  . Sexual activity: Not on file     Family History  Problem Relation Age of Onset  . Diabetes Mother   . Healthy Father   . Breast cancer Neg Hx      Immunization History  Administered Date(s) Administered  . Influenza-Unspecified 10/19/2017  . Moderna SARS-COVID-2 Vaccination 01/21/2019     Objective: Vitals:   02/14/19 1558  BP: (!) 158/91   Pulse: 83    Vascular Examination:  Capillary refill time to digits immediate b/l, palpable DP pulses b/l, palpable PT pulses b/l, pedal hair present b/l and skin temperature gradient within normal limits b/l  Dermatological Examination: Pedal skin with normal turgor, texture and tone bilaterally, no open wounds bilaterally, no interdigital macerations bilaterally, toenails 1-5 b/l elongated, dystrophic, thickened, crumbly with subungual debris and porokeratotic lesion(s) submet head 5 b/l. No erythema, no edema, no drainage, no flocculence  Musculoskeletal: Normal muscle strength 5/5 to all lower extremity muscle groups bilaterally, no gross bony deformities bilaterally, no pain crepitus or joint limitation noted with ROM b/l and hammertoes noted to the b/l 5th toes.  Neurological: Protective sensation intact 5/5 intact bilaterally with 10g monofilament b/l and vibratory sensation intact b/l  Assessment: 1. Pain due to onychomycosis of toenails of both feet   2. Porokeratosis   3. Uncontrolled type 2 diabetes mellitus with hyperglycemia (HCC)     Plan: -Discussed diabetic foot care principles. Literature dispensed on today. -Toenails 1-5 b/l were debrided in length and girth without iatrogenic bleeding. -Painful porokeratotic lesions submet head 5 b/l pared and enucleated with sterile scalpel blade -Patient to continue soft, supportive shoe gear daily. -Patient to report any pedal injuries to medical professional immediately. -Patient/POA to call should there be question/concern in the interim.  Return in about 3 months (around 05/15/2019) for diabetic  callus trim.

## 2019-02-23 ENCOUNTER — Telehealth: Payer: Self-pay

## 2019-02-23 NOTE — Telephone Encounter (Signed)
The pt called back and said that she did get her second moderna vaccination on 02/18/2019.

## 2019-02-23 NOTE — Telephone Encounter (Signed)
I left the pt a detailed message at her request that I was checking to see if she went and got the second Moderna covid vaccination on 02/18/2019 so that I can update her  Immunizations.

## 2019-02-25 ENCOUNTER — Other Ambulatory Visit: Payer: Self-pay | Admitting: Internal Medicine

## 2019-02-27 ENCOUNTER — Other Ambulatory Visit: Payer: Self-pay | Admitting: Internal Medicine

## 2019-02-27 DIAGNOSIS — I10 Essential (primary) hypertension: Secondary | ICD-10-CM

## 2019-03-31 ENCOUNTER — Other Ambulatory Visit: Payer: Self-pay | Admitting: Internal Medicine

## 2019-03-31 DIAGNOSIS — E7800 Pure hypercholesterolemia, unspecified: Secondary | ICD-10-CM

## 2019-04-05 ENCOUNTER — Other Ambulatory Visit: Payer: Self-pay | Admitting: Internal Medicine

## 2019-04-05 DIAGNOSIS — I10 Essential (primary) hypertension: Secondary | ICD-10-CM

## 2019-05-01 ENCOUNTER — Other Ambulatory Visit: Payer: Self-pay | Admitting: Internal Medicine

## 2019-05-01 DIAGNOSIS — I10 Essential (primary) hypertension: Secondary | ICD-10-CM

## 2019-05-16 ENCOUNTER — Ambulatory Visit: Payer: PRIVATE HEALTH INSURANCE | Admitting: Podiatry

## 2019-06-15 ENCOUNTER — Other Ambulatory Visit: Payer: Self-pay

## 2019-06-15 ENCOUNTER — Encounter: Payer: Self-pay | Admitting: Internal Medicine

## 2019-06-15 ENCOUNTER — Ambulatory Visit: Payer: PRIVATE HEALTH INSURANCE | Admitting: Internal Medicine

## 2019-06-15 VITALS — BP 110/76 | HR 77 | Temp 98.1°F | Ht 61.6 in | Wt 186.4 lb

## 2019-06-15 DIAGNOSIS — M2550 Pain in unspecified joint: Secondary | ICD-10-CM

## 2019-06-15 DIAGNOSIS — F419 Anxiety disorder, unspecified: Secondary | ICD-10-CM | POA: Diagnosis not present

## 2019-06-15 DIAGNOSIS — E6609 Other obesity due to excess calories: Secondary | ICD-10-CM

## 2019-06-15 DIAGNOSIS — Z23 Encounter for immunization: Secondary | ICD-10-CM

## 2019-06-15 DIAGNOSIS — I10 Essential (primary) hypertension: Secondary | ICD-10-CM

## 2019-06-15 DIAGNOSIS — M791 Myalgia, unspecified site: Secondary | ICD-10-CM

## 2019-06-15 DIAGNOSIS — Z79899 Other long term (current) drug therapy: Secondary | ICD-10-CM

## 2019-06-15 DIAGNOSIS — E1165 Type 2 diabetes mellitus with hyperglycemia: Secondary | ICD-10-CM

## 2019-06-15 DIAGNOSIS — Z6834 Body mass index (BMI) 34.0-34.9, adult: Secondary | ICD-10-CM

## 2019-06-15 MED ORDER — HYDROCODONE-ACETAMINOPHEN 5-325 MG PO TABS: 1.0000 | ORAL_TABLET | Freq: Four times a day (QID) | ORAL | 0 refills | Status: DC | PRN

## 2019-06-15 MED ORDER — PNEUMOCOCCAL 13-VAL CONJ VACC IM SUSP: 0.5000 mL | INTRAMUSCULAR | 0 refills | Status: AC

## 2019-06-15 MED ORDER — SERTRALINE HCL 50 MG PO TABS
50.0000 mg | ORAL_TABLET | Freq: Every day | ORAL | 2 refills | Status: DC
Start: 2019-06-15 — End: 2019-09-05

## 2019-06-15 NOTE — Patient Instructions (Addendum)
Increase the sertraline 25mg  - take TWO tablets daily in the morning until you run out.

## 2019-06-15 NOTE — Progress Notes (Signed)
This visit occurred during the SARS-CoV-2 public health emergency.  Safety protocols were in place, including screening questions prior to the visit, additional usage of staff PPE, and extensive cleaning of exam room while observing appropriate contact time as indicated for disinfecting solutions.  Subjective:     Patient ID: Whitney Sanchez , female    DOB: 11/21/1955 , 64 y.o.   MRN: [id number]   Chief Complaint  Patient presents with  . Diabetes  . Hypertension    HPI  She presents today for diabetes and BP check. Reports compliance with meds.   She did not have any problems with sertraline that was started at her last visit for anxiety. Today, she is more concerned about worsening joint pains and muscle aches. She is not sure what may be contributing to her sx.   Diabetes She presents for her follow-up diabetic visit. She has type 2 diabetes mellitus. Pertinent negatives for diabetes include no blurred vision and no chest pain. There are no hypoglycemic complications. Risk factors for coronary artery disease include diabetes mellitus, dyslipidemia, hypertension, sedentary lifestyle, post-menopausal and obesity. An ACE inhibitor/angiotensin II receptor blocker is being taken.  Hypertension This is a chronic problem. The current episode started more than 1 year ago. The problem has been gradually improving since onset. The problem is uncontrolled. Pertinent negatives include no blurred vision or chest pain. Risk factors for coronary artery disease include dyslipidemia and diabetes mellitus. The current treatment provides moderate improvement.     Past Medical History:  Diagnosis Date  . DM (diabetes mellitus) ([place])   . High cholesterol   . HTN (hypertension)      Family History  Problem Relation Age of Onset  . Diabetes Mother   . Healthy Father   . Breast cancer Neg Hx      Current Outpatient Medications:  .  LIVALO 4 MG TABS, TAKE ONE TABLET BY MOUTH DAILY, Disp: 30  tablet, Rfl: 3 .  losartan (COZAAR) 100 MG tablet, TAKE ONE TABLET BY MOUTH DAILY, Disp: 30 tablet, Rfl: 0 .  OZEMPIC, 1 MG/DOSE, 2 MG/1.5ML SOPN, DIAL AND INJECT UNDER THE SKIN 1 MG WEEKLY, Disp: 6 pen, Rfl: 1 .  sertraline (ZOLOFT) 25 MG tablet, TAKE ONE TABLET BY MOUTH DAILY, Disp: 30 tablet, Rfl: 0 .  pneumococcal 13-valent conjugate vaccine (PREVNAR 13) SUSP injection, Inject 0.5 mLs into the muscle tomorrow at 10 am for 1 dose., Disp: 0.5 mL, Rfl: 0   No Known Allergies   Review of Systems  Constitutional: Negative.   Eyes: Negative for blurred vision.  Respiratory: Negative.   Cardiovascular: Negative.  Negative for chest pain.  Gastrointestinal: Negative.   Musculoskeletal: Positive for arthralgias and myalgias.  Neurological: Negative.   Psychiatric/Behavioral: Negative.      Today's Vitals   06/15/19 1038  BP: 110/76  Pulse: 77  Temp: 98.1 F (36.7 C)  TempSrc: Oral  Weight: 186 lb 6.4 oz (84.6 kg)  Height: 5' 1.6" (1.565 m)  PainSc: 8   PainLoc: Leg   Body mass index is 34.54 kg/m.   Objective:  Physical Exam Vitals and nursing note reviewed.  Constitutional:      Appearance: Normal appearance.  HENT:     Head: Normocephalic and atraumatic.  Cardiovascular:     Rate and Rhythm: Normal rate and regular rhythm.     Heart sounds: Normal heart sounds.  Pulmonary:     Effort: Pulmonary effort is normal.     Breath sounds: Normal breath sounds.  Musculoskeletal:     Comments: Full ROM of hands, elbows, knees. Thighs tender to deep palpation. No other tender points noted.   Skin:    General: Skin is warm.  Neurological:     General: No focal deficit present.     Mental Status: She is alert.  Psychiatric:        Mood and Affect: Mood normal.        Behavior: Behavior normal.         Assessment And Plan:     1. Uncontrolled type 2 diabetes mellitus with hyperglycemia (HCC)  Chronic, I will check labs as listed below.  She is encouraged to incorporate  more exercise into her daily routine and to avoid sugary beverages.   - CMP14+EGFR - Hemoglobin A1c  2. Essential hypertension, benign  Chronic, well controlled. She will continue with current meds. She is encouraged to avoid adding salt to her foods.   3. Anxiety  Slight improvement in sx, I will increase sertraline to 26m daily. She will rto in four weeks for re-evaluation.   4. Arthralgia, unspecified joint  I will check labs as listed below. She is encouraged to eat an anti-inflammatory diet. I will make further recommendations once her labs are available for review. She was given rx vicodin prn. She was "scared" of tramadol due to side effects family members have experienced. Review of the [place].   - ANA, IFA (with reflex) - CYCLIC CITRUL PEPTIDE ANTIBODY, IGG/IGA - Rheumatoid factor - Sedimentation rate - Uric acid  5. Myalgia  She is encouraged to stay well hydrated. She may benefit from magnesium supplementation, [id number].   - CK, total  6. Drug therapy  - Vitamin B12  7. Class 1 obesity due to excess calories with serious comorbidity and body mass index (BMI) of 34.0 to 34.9 in adult  She is encouraged to strive for BMI less than 30 to decrease cardiac risk.  She is encouraged to incorporate more exercise into her daily routine - working up to 30 minutes five days per week.   8. Immunization due  She was given rx Prevnar-13 to get at her local pharmacy.     RobMaximino GreenlandD    THE PATIENT IS ENCOURAGED TO PRACTICE SOCIAL DISTANCING DUE TO THE COVID-19 PANDEMIC.

## 2019-06-16 LAB — HEMOGLOBIN A1C
Est. average glucose Bld gHb Est-mCnc: 111 mg/dL
Hgb A1c MFr Bld: 5.5 % (ref 4.8–5.6)

## 2019-06-16 LAB — CMP14+EGFR
ALT: 14 IU/L (ref 0–32)
AST: 18 IU/L (ref 0–40)
Albumin/Globulin Ratio: 1.8 (ref 1.2–2.2)
Albumin: 4.2 g/dL (ref 3.8–4.8)
Alkaline Phosphatase: 76 IU/L (ref 48–121)
BUN/Creatinine Ratio: 22 (ref 12–28)
BUN: 15 mg/dL (ref 8–27)
Bilirubin Total: 0.3 mg/dL (ref 0.0–1.2)
CO2: 23 mmol/L (ref 20–29)
Calcium: 9.3 mg/dL (ref 8.7–10.3)
Chloride: 105 mmol/L (ref 96–106)
Creatinine, Ser: 0.69 mg/dL (ref 0.57–1.00)
GFR calc Af Amer: 105 mL/min/{1.73_m2} (ref >59)
GFR calc non Af Amer: 91 mL/min/{1.73_m2} (ref >59)
Globulin, Total: 2.4 g/dL (ref 1.5–4.5)
Glucose: 77 mg/dL (ref 65–99)
Potassium: 4.5 mmol/L (ref 3.5–5.2)
Sodium: 142 mmol/L (ref 134–144)
Total Protein: 6.6 g/dL (ref 6.0–8.5)

## 2019-06-16 LAB — ANTINUCLEAR ANTIBODIES, IFA: ANA Titer 1: NEGATIVE

## 2019-06-16 LAB — RHEUMATOID FACTOR: Rheumatoid fact SerPl-aCnc: <10 IU/mL (ref 0.0–13.9)

## 2019-06-16 LAB — SEDIMENTATION RATE: Sed Rate: 9 mm/hr (ref 0–40)

## 2019-06-16 LAB — URIC ACID: Uric Acid: 4.4 mg/dL (ref 3.0–7.2)

## 2019-06-16 LAB — CK: Total CK: 170 U/L (ref 32–182)

## 2019-06-16 LAB — CYCLIC CITRUL PEPTIDE ANTIBODY, IGG/IGA: Cyclic Citrullin Peptide Ab: 17 units (ref 0–19)

## 2019-06-16 LAB — VITAMIN B12: Vitamin B-12: 932 pg/mL (ref 232–1245)

## 2019-06-20 ENCOUNTER — Telehealth: Payer: Self-pay

## 2019-06-20 NOTE — Telephone Encounter (Signed)
-----   Message from [name], MD sent at 06/17/2019  6:46 PM EDT ----- Hba1c is great, 5.8.  liver and kidney fxn are nl.  Rheumatoid arthritis panel is negative. How is she feeling, any better?

## 2019-07-08 ENCOUNTER — Other Ambulatory Visit: Payer: Self-pay | Admitting: Internal Medicine

## 2019-07-08 DIAGNOSIS — I10 Essential (primary) hypertension: Secondary | ICD-10-CM

## 2019-08-04 ENCOUNTER — Other Ambulatory Visit: Payer: Self-pay | Admitting: Internal Medicine

## 2019-08-04 DIAGNOSIS — I10 Essential (primary) hypertension: Secondary | ICD-10-CM

## 2019-09-04 ENCOUNTER — Other Ambulatory Visit: Payer: Self-pay | Admitting: Internal Medicine

## 2019-09-04 DIAGNOSIS — E7800 Pure hypercholesterolemia, unspecified: Secondary | ICD-10-CM

## 2019-09-05 NOTE — Telephone Encounter (Signed)
Refill request Sertraline (Zoloft)

## 2019-09-27 ENCOUNTER — Other Ambulatory Visit: Payer: Self-pay

## 2019-09-27 ENCOUNTER — Encounter: Payer: Self-pay | Admitting: Nurse Practitioner

## 2019-09-27 ENCOUNTER — Ambulatory Visit (INDEPENDENT_AMBULATORY_CARE_PROVIDER_SITE_OTHER): Payer: PRIVATE HEALTH INSURANCE | Admitting: Nurse Practitioner

## 2019-09-27 VITALS — BP 140/88 | HR 73 | Temp 98.0°F | Ht 61.6 in | Wt 187.0 lb

## 2019-09-27 DIAGNOSIS — E6609 Other obesity due to excess calories: Secondary | ICD-10-CM

## 2019-09-27 DIAGNOSIS — F419 Anxiety disorder, unspecified: Secondary | ICD-10-CM

## 2019-09-27 DIAGNOSIS — E7800 Pure hypercholesterolemia, unspecified: Secondary | ICD-10-CM

## 2019-09-27 DIAGNOSIS — R7309 Other abnormal glucose: Secondary | ICD-10-CM | POA: Diagnosis not present

## 2019-09-27 DIAGNOSIS — M545 Low back pain, unspecified: Secondary | ICD-10-CM

## 2019-09-27 DIAGNOSIS — I10 Essential (primary) hypertension: Secondary | ICD-10-CM

## 2019-09-27 DIAGNOSIS — Z6834 Body mass index (BMI) 34.0-34.9, adult: Secondary | ICD-10-CM

## 2019-09-27 MED ORDER — KETOROLAC TROMETHAMINE 30 MG/ML IJ SOLN
30.0000 mg | Freq: Once | INTRAMUSCULAR | Status: AC
  Administered 2019-09-27: 30 mg via INTRAMUSCULAR

## 2019-09-27 NOTE — Patient Instructions (Signed)

## 2019-09-27 NOTE — Progress Notes (Signed)
I,Erica T Jackson,acting as a scribe for Janece Moore, FNP.,have documented all relevant documentation on the behalf of Janece Moore, FNP,as directed by  Janece Moore, FNP while in the presence of Janece Moore, FNP.   This visit occurred during the SARS-CoV-2 public health emergency.  Safety protocols were in place, including screening questions prior to the visit, additional usage of staff PPE, and extensive cleaning of exam room while observing appropriate contact time as indicated for disinfecting solutions.  Subjective:     Patient ID: Whitney Sanchez , female    DOB: 07/13/1955 , 64 y.o.   MRN: [id number]   Chief Complaint  Patient presents with  . Back Pain    lower    HPI  Pt presents today with lower back pain started this past Friday.  Mattress is 64 years old.   Back Pain This is a new problem. The current episode started in the past 7 days (Began on Friday). The problem occurs constantly. The problem has been gradually worsening since onset. The quality of the pain is described as aching. Radiates to: mild discomfort to left upper thigh. The pain is at a severity of 8/10. The pain is severe. The pain is the same all the time. The symptoms are aggravated by standing. Stiffness is present all day. Pertinent negatives include no abdominal pain, fever, headaches, leg pain or numbness. Risk factors include sedentary lifestyle, obesity and lack of exercise. She has tried NSAIDs and analgesics (she has been using icy/hot rubs) for the symptoms.     Past Medical History:  Diagnosis Date  . DM (diabetes mellitus) (HCC)   . High cholesterol   . HTN (hypertension)      Family History  Problem Relation Age of Onset  . Diabetes Mother   . Healthy Father   . Breast cancer Neg Hx      Current Outpatient Medications:  .  HYDROcodone-acetaminophen (NORCO/VICODIN) 5-325 MG tablet, Take 1 tablet by mouth every 6 (six) hours as needed for moderate pain., Disp: 20 tablet, Rfl: 0 .   LIVALO 4 MG TABS, TAKE ONE TABLET BY MOUTH DAILY, Disp: 30 tablet, Rfl: 3 .  losartan (COZAAR) 100 MG tablet, TAKE ONE TABLET BY MOUTH DAILY, Disp: 90 tablet, Rfl: 1 .  OZEMPIC, 1 MG/DOSE, 2 MG/1.5ML SOPN, DIAL AND INJECT UNDER THE SKIN 1 MG WEEKLY, Disp: 6 pen, Rfl: 1 .  sertraline (ZOLOFT) 50 MG tablet, TAKE ONE TABLET BY MOUTH DAILY, Disp: 90 tablet, Rfl: 2   No Known Allergies   Review of Systems  Constitutional: Negative.  Negative for fever.  HENT: Negative.   Respiratory: Negative.   Cardiovascular: Negative.   Gastrointestinal: Negative.  Negative for abdominal pain.  Musculoskeletal: Positive for back pain.  Skin: Negative.   Neurological: Negative.  Negative for numbness and headaches.  Psychiatric/Behavioral: Negative.      Today's Vitals   09/27/19 0858  BP: 140/88  Pulse: 73  Temp: 98 F (36.7 C)  TempSrc: Oral  Weight: 187 lb (84.8 kg)  Height: 5' 1.6" (1.565 m)  PainSc: 10-Worst pain ever  PainLoc: Back   Body mass index is 34.65 kg/m.   Objective:  Physical Exam Constitutional:      General: She is not in acute distress.    Appearance: Normal appearance.  Cardiovascular:     Rate and Rhythm: Normal rate and regular rhythm.     Pulses: Normal pulses.     Heart sounds: Normal heart sounds. No murmur heard.     Pulmonary:     Effort: Pulmonary effort is normal. No respiratory distress.     Breath sounds: Normal breath sounds.  Musculoskeletal:        General: Tenderness (left lower back muscle area) present. No swelling. Normal range of motion.     Comments: She does have some stiffness with rotation. Negative straight leg raise  Skin:    General: Skin is warm and dry.  Neurological:     General: No focal deficit present.     Mental Status: She is alert and oriented to person, place, and time.     Cranial Nerves: No cranial nerve deficit.         Assessment And Plan:     1. Acute left-sided low back pain without sciatica  This is likely  muscle strain, tenderness on palpation will treat with toradol.  - ketorolac (TORADOL) 30 MG/ML injection 30 mg  2. Pure hypercholesterolemia  Chronic, continue with current medications will check cholesterol levels.  - CMP14+EGFR - Lipid panel  3. Essential hypertension, benign  Chronic, well controlled   Continue with current medications - CMP14+EGFR  4. Abnormal glucose  HgbA1c was normal at last visit, continue with diet, exercise and Ozempic.   - Hemoglobin A1c - CBC  5. Class 1 obesity due to excess calories with serious comorbidity and body mass index (BMI) of 34.0 to 34.9 in adult  Chronic  Discussed healthy diet and regular exercise options   Encouraged to exercise at least 150 minutes per week with 2 days of strength training as tolerated, start with walking  I have also encouraged her to stretch regularly to help with muscle tension and stiffness.  6. Anxiety  chronic, well controlled with Sertraline and doing well.   Patient was given opportunity to ask questions. Patient verbalized understanding of the plan and was able to repeat key elements of the plan. All questions were answered to their satisfaction.  Janece Moore, FNP   I, Janece Moore, FNP, have reviewed all documentation for this visit. The documentation on 09/27/19 for the exam, diagnosis, procedures, and orders are all accurate and complete.  THE PATIENT IS ENCOURAGED TO PRACTICE SOCIAL DISTANCING DUE TO THE COVID-19 PANDEMIC.   

## 2019-09-28 LAB — CMP14+EGFR
ALT: 21 IU/L (ref 0–32)
AST: 21 IU/L (ref 0–40)
Albumin/Globulin Ratio: 1.8 (ref 1.2–2.2)
Albumin: 4.8 g/dL (ref 3.8–4.8)
Alkaline Phosphatase: 80 IU/L (ref 48–121)
BUN/Creatinine Ratio: 17 (ref 12–28)
BUN: 13 mg/dL (ref 8–27)
Bilirubin Total: 0.4 mg/dL (ref 0.0–1.2)
CO2: 22 mmol/L (ref 20–29)
Calcium: 9.7 mg/dL (ref 8.7–10.3)
Chloride: 102 mmol/L (ref 96–106)
Creatinine, Ser: 0.77 mg/dL (ref 0.57–1.00)
GFR calc Af Amer: 93 mL/min/{1.73_m2} (ref >59)
GFR calc non Af Amer: 81 mL/min/{1.73_m2} (ref >59)
Globulin, Total: 2.6 g/dL (ref 1.5–4.5)
Glucose: 91 mg/dL (ref 65–99)
Potassium: 5 mmol/L (ref 3.5–5.2)
Sodium: 139 mmol/L (ref 134–144)
Total Protein: 7.4 g/dL (ref 6.0–8.5)

## 2019-09-28 LAB — LIPID PANEL
Chol/HDL Ratio: 3.4 ratio (ref 0.0–4.4)
Cholesterol, Total: 214 mg/dL — ABNORMAL HIGH (ref 100–199)
HDL: 63 mg/dL (ref >39)
LDL Chol Calc (NIH): 123 mg/dL — ABNORMAL HIGH (ref 0–99)
Triglycerides: 159 mg/dL — ABNORMAL HIGH (ref 0–149)
VLDL Cholesterol Cal: 28 mg/dL (ref 5–40)

## 2019-09-28 LAB — CBC
Hematocrit: 36.9 % (ref 34.0–46.6)
Hemoglobin: 12.1 g/dL (ref 11.1–15.9)
MCH: 29.3 pg (ref 26.6–33.0)
MCHC: 32.8 g/dL (ref 31.5–35.7)
MCV: 89 fL (ref 79–97)
Platelets: 276 10*3/uL (ref 150–450)
RBC: 4.13 x10E6/uL (ref 3.77–5.28)
RDW: 13.2 % (ref 11.7–15.4)
WBC: 5.9 10*3/uL (ref 3.4–10.8)

## 2019-09-28 LAB — HEMOGLOBIN A1C
Est. average glucose Bld gHb Est-mCnc: 114 mg/dL
Hgb A1c MFr Bld: 5.6 % (ref 4.8–5.6)

## 2019-10-03 ENCOUNTER — Other Ambulatory Visit: Payer: Self-pay

## 2019-10-03 MED ORDER — PREDNISONE 10 MG (21) PO TBPK: ORAL_TABLET | Freq: Every day | ORAL | 0 refills | Status: DC

## 2019-10-07 ENCOUNTER — Other Ambulatory Visit: Payer: Self-pay | Admitting: Internal Medicine

## 2019-10-17 ENCOUNTER — Ambulatory Visit: Payer: PRIVATE HEALTH INSURANCE | Admitting: Internal Medicine

## 2019-10-18 ENCOUNTER — Ambulatory Visit (INDEPENDENT_AMBULATORY_CARE_PROVIDER_SITE_OTHER): Payer: PRIVATE HEALTH INSURANCE | Admitting: Internal Medicine

## 2019-10-18 ENCOUNTER — Ambulatory Visit: Payer: PRIVATE HEALTH INSURANCE | Admitting: Internal Medicine

## 2019-10-18 ENCOUNTER — Encounter: Payer: Self-pay | Admitting: Internal Medicine

## 2019-10-18 ENCOUNTER — Other Ambulatory Visit: Payer: Self-pay

## 2019-10-18 VITALS — BP 124/76 | HR 86 | Temp 98.2°F | Ht 61.6 in | Wt 187.8 lb

## 2019-10-18 DIAGNOSIS — I10 Essential (primary) hypertension: Secondary | ICD-10-CM

## 2019-10-18 DIAGNOSIS — E66811 Obesity, class 1: Secondary | ICD-10-CM

## 2019-10-18 DIAGNOSIS — M545 Low back pain, unspecified: Secondary | ICD-10-CM

## 2019-10-18 DIAGNOSIS — E6609 Other obesity due to excess calories: Secondary | ICD-10-CM

## 2019-10-18 DIAGNOSIS — Z6834 Body mass index (BMI) 34.0-34.9, adult: Secondary | ICD-10-CM

## 2019-10-18 DIAGNOSIS — F419 Anxiety disorder, unspecified: Secondary | ICD-10-CM

## 2019-10-18 DIAGNOSIS — Z23 Encounter for immunization: Secondary | ICD-10-CM | POA: Diagnosis not present

## 2019-10-18 DIAGNOSIS — E1165 Type 2 diabetes mellitus with hyperglycemia: Secondary | ICD-10-CM

## 2019-10-18 MED ORDER — PNEUMOCOCCAL 13-VAL CONJ VACC IM SUSP: 0.5000 mL | INTRAMUSCULAR | 0 refills | Status: AC

## 2019-10-18 MED ORDER — CYCLOBENZAPRINE HCL 5 MG PO TABS: ORAL_TABLET | ORAL | 0 refills | Status: DC

## 2019-10-18 NOTE — Progress Notes (Signed)
I,Katawbba Wiggins,acting as a Neurosurgeon for Gwynneth Aliment, MD.,have documented all relevant documentation on the behalf of Gwynneth Aliment, MD,as directed by  Gwynneth Aliment, MD while in the presence of Gwynneth Aliment, MD.  This visit occurred during the SARS-CoV-2 public health emergency.  Safety protocols were in place, including screening questions prior to the visit, additional usage of staff PPE, and extensive cleaning of exam room while observing appropriate contact time as indicated for disinfecting solutions.  Subjective:     Patient ID: Whitney Sanchez , female    DOB: 09/27/1955 , 64 y.o.   MRN: [id number]   Chief Complaint  Patient presents with  . Diabetes  . Hypertension    HPI  She presents today for diabetes, blood pressure follow-up.  She reports compliance with meds. She was seen by NP last month and had bloodwork drawn.   Diabetes She presents for her follow-up diabetic visit. She has type 2 diabetes mellitus. Hypoglycemia symptoms include nervousness/anxiousness. Pertinent negatives for diabetes include no blurred vision and no chest pain. There are no hypoglycemic complications. Risk factors for coronary artery disease include diabetes mellitus, dyslipidemia, hypertension, sedentary lifestyle, post-menopausal and obesity. An ACE inhibitor/angiotensin II receptor blocker is being taken.  Hypertension This is a chronic problem. The current episode started more than 1 year ago. The problem has been gradually improving since onset. The problem is uncontrolled. Pertinent negatives include no blurred vision or chest pain. Risk factors for coronary artery disease include dyslipidemia and diabetes mellitus. The current treatment provides moderate improvement.     Past Medical History:  Diagnosis Date  . DM (diabetes mellitus) (HCC)   . High cholesterol   . HTN (hypertension)      Family History  Problem Relation Age of Onset  . Diabetes Mother   . Healthy Father    . Breast cancer Neg Hx      Current Outpatient Medications:  .  LIVALO 4 MG TABS, TAKE ONE TABLET BY MOUTH DAILY, Disp: 30 tablet, Rfl: 3 .  losartan (COZAAR) 100 MG tablet, TAKE ONE TABLET BY MOUTH DAILY, Disp: 90 tablet, Rfl: 1 .  OZEMPIC, 1 MG/DOSE, 4 MG/3ML SOPN, DIAL AND INJECT UNDER THE SKIN 1 MG WEEKLY, Disp: 6 mL, Rfl: 2 .  cyclobenzaprine (FLEXERIL) 5 MG tablet, One tab po qhs prn, Disp: 30 tablet, Rfl: 0 .  HYDROcodone-acetaminophen (NORCO/VICODIN) 5-325 MG tablet, Take 1 tablet by mouth every 6 (six) hours as needed for moderate pain. (Patient not taking: Reported on 10/18/2019), Disp: 20 tablet, Rfl: 0   No Known Allergies   Review of Systems  Constitutional: Negative.   Eyes: Negative for blurred vision.  Respiratory: Negative.   Cardiovascular: Negative.  Negative for chest pain.  Gastrointestinal: Negative.   Musculoskeletal: Positive for back pain.       States is still having left sided lower back pain. Denies LE weakness/paresthesias. Exacerbated with positional changes.   Psychiatric/Behavioral: The patient is nervous/anxious.        States she stopped the sertraline - she felt it caused her to have palpitations. Felt fine on 25mg  dose - sx worsened with 50mg  dose. Does not wish to take anything else at this time.   All other systems reviewed and are negative.    Today's Vitals   10/18/19 1103  BP: 124/76  Pulse: 86  Temp: 98.2 F (36.8 C)  TempSrc: Oral  Weight: 187 lb 12.8 oz (85.2 kg)  Height: 5' 1.6" (1.565 m)  Body mass index is 34.8 kg/m.   Objective:  Physical Exam Vitals and nursing note reviewed.  Constitutional:      Appearance: Normal appearance. She is obese.  HENT:     Head: Normocephalic and atraumatic.  Cardiovascular:     Rate and Rhythm: Normal rate and regular rhythm.     Heart sounds: Normal heart sounds.  Pulmonary:     Breath sounds: Normal breath sounds.  Skin:    General: Skin is warm.  Neurological:     General: No  focal deficit present.     Mental Status: She is alert and oriented to person, place, and time.         Assessment And Plan:     1. Uncontrolled type 2 diabetes mellitus with hyperglycemia (HCC) Comments: Chronic, I  reviewed August 2021 labwork during her visit.  She is encouraged to follow dietary guidelines.   2. Essential hypertension, benign Comments: Chronic, well controlled. She will continue with current meds. Encouraged to follow low sodium diet.   3. Anxiety Comments: Unable to tolerate sertraline. She does not wish to start new medication at this time. She may benefit from magnesium supplementation.   4. Acute left-sided low back pain without sciatica Comments: Persistent. I will send rx cyclobenzaprine, 5mg  po qpm prn. Advised medication may make her sleepy. Also advised to take no later than 8pm.   5. Class 1 obesity due to excess calories with serious comorbidity and body mass index (BMI) of 34.0 to 34.9 in adult Comments: Chronic, encouraged to strive for BMI less than 30 to decrease cardiac risk. Advised to slowly increase activity as tolerated.   6. Need for vaccination Comments: She was given flu vaccine. I will also send rx Prevnar-13 to her local pharmacy.  - Flu Vaccine QUAD High Dose(Fluad) She is encouraged to strive for BMI less than 30 to decrease cardiac risk. Advised to aim for at least 150 minutes of exercise per week.    Patient was given opportunity to ask questions. Patient verbalized understanding of the plan and was able to repeat key elements of the plan. All questions were answered to their satisfaction.  , MD   I, Gwynneth Aliment, MD, have reviewed all documentation for this visit. The documentation on 11/04/19 for the exam, diagnosis, procedures, and orders are all accurate and complete.  THE PATIENT IS ENCOURAGED TO PRACTICE SOCIAL DISTANCING DUE TO THE COVID-19 PANDEMIC.

## 2019-10-18 NOTE — Patient Instructions (Signed)
Diabetes Mellitus and Foot Care Foot care is an important part of your health, especially when you have diabetes. Diabetes may cause you to have problems because of poor blood flow (circulation) to your feet and legs, which can cause your skin to:  Become thinner and drier.  Break more easily.  Heal more slowly.  Peel and crack. You may also have nerve damage (neuropathy) in your legs and feet, causing decreased feeling in them. This means that you may not notice minor injuries to your feet that could lead to more serious problems. Noticing and addressing any potential problems early is the best way to prevent future foot problems. How to care for your feet Foot hygiene  Wash your feet daily with warm water and mild soap. Do not use hot water. Then, pat your feet and the areas between your toes until they are completely dry. Do not soak your feet as this can dry your skin.  Trim your toenails straight across. Do not dig under them or around the cuticle. File the edges of your nails with an emery board or nail file.  Apply a moisturizing lotion or petroleum jelly to the skin on your feet and to dry, brittle toenails. Use lotion that does not contain alcohol and is unscented. Do not apply lotion between your toes. Shoes and socks  Wear clean socks or stockings every day. Make sure they are not too tight. Do not wear knee-high stockings since they may decrease blood flow to your legs.  Wear shoes that fit properly and have enough cushioning. Always look in your shoes before you put them on to be sure there are no objects inside.  To break in new shoes, wear them for just a few hours a day. This prevents injuries on your feet. Wounds, scrapes, corns, and calluses  Check your feet daily for blisters, cuts, bruises, sores, and redness. If you cannot see the bottom of your feet, use a mirror or ask someone for help.  Do not cut corns or calluses or try to remove them with medicine.  If you  find a minor scrape, cut, or break in the skin on your feet, keep it and the skin around it clean and dry. You may clean these areas with mild soap and water. Do not clean the area with peroxide, alcohol, or iodine.  If you have a wound, scrape, corn, or callus on your foot, look at it several times a day to make sure it is healing and not infected. Check for: ? Redness, swelling, or pain. ? Fluid or blood. ? Warmth. ? Pus or a bad smell. General instructions  Do not cross your legs. This may decrease blood flow to your feet.  Do not use heating pads or hot water bottles on your feet. They may burn your skin. If you have lost feeling in your feet or legs, you may not know this is happening until it is too late.  Protect your feet from hot and cold by wearing shoes, such as at the beach or on hot pavement.  Schedule a complete foot exam at least once a year (annually) or more often if you have foot problems. If you have foot problems, report any cuts, sores, or bruises to your health care provider immediately. Contact a health care provider if:  You have a medical condition that increases your risk of infection and you have any cuts, sores, or bruises on your feet.  You have an injury that is not   healing.  You have redness on your legs or feet.  You feel burning or tingling in your legs or feet.  You have pain or cramps in your legs and feet.  Your legs or feet are numb.  Your feet always feel cold.  You have pain around a toenail. Get help right away if:  You have a wound, scrape, corn, or callus on your foot and: ? You have pain, swelling, or redness that gets worse. ? You have fluid or blood coming from the wound, scrape, corn, or callus. ? Your wound, scrape, corn, or callus feels warm to the touch. ? You have pus or a bad smell coming from the wound, scrape, corn, or callus. ? You have a fever. ? You have a red line going up your leg. Summary  Check your feet every day  for cuts, sores, red spots, swelling, and blisters.  Moisturize feet and legs daily.  Wear shoes that fit properly and have enough cushioning.  If you have foot problems, report any cuts, sores, or bruises to your health care provider immediately.  Schedule a complete foot exam at least once a year (annually) or more often if you have foot problems. This information is not intended to replace advice given to you by your health care provider. Make sure you discuss any questions you have with your health care provider. Document Revised: 09/28/2018 Document Reviewed: 02/07/2016 Elsevier Patient Education  2020 [employer]  

## 2019-11-06 ENCOUNTER — Telehealth: Payer: Self-pay

## 2019-11-06 NOTE — Telephone Encounter (Signed)
The pt was told that [name] wanted to check and see how the pt is feeling and the pt said that she is feeling better, her pain isn't as bad as it was in her back and that it comes and goes.  Yes she tried the muscle relaxer.

## 2019-11-06 NOTE — Telephone Encounter (Signed)
-----   Message from [name], MD sent at 11/04/2019  8:56 AM EDT ----- Please check on patient. How is she feeling? How is her back? Did she try muscle relaxer?

## 2019-11-17 ENCOUNTER — Ambulatory Visit (INDEPENDENT_AMBULATORY_CARE_PROVIDER_SITE_OTHER): Payer: PRIVATE HEALTH INSURANCE

## 2019-11-17 DIAGNOSIS — Z23 Encounter for immunization: Secondary | ICD-10-CM

## 2019-11-17 NOTE — Progress Notes (Signed)
   [facility]  Name:  MCKENLEIGH TARLTON    MRN: [id number] DOB: [birth year]  11/17/2019  Ms. Aller was observed post Covid-19 immunization for 15 minutes without incident. She was provided with Vaccine Information Sheet and instruction to access the V-Safe system.   Ms. Dominik was instructed to call 911 with any severe reactions post vaccine: Marland Kitchen Difficulty breathing  . Swelling of face and throat  . A fast heartbeat  . A bad rash all over body  . Dizziness and weakness

## 2020-01-23 ENCOUNTER — Other Ambulatory Visit: Payer: Self-pay

## 2020-01-23 DIAGNOSIS — E7800 Pure hypercholesterolemia, unspecified: Secondary | ICD-10-CM

## 2020-01-23 DIAGNOSIS — I10 Essential (primary) hypertension: Secondary | ICD-10-CM

## 2020-01-23 MED ORDER — LIVALO 4 MG PO TABS: 1.0000 | ORAL_TABLET | Freq: Every day | ORAL | 1 refills | Status: DC

## 2020-01-23 MED ORDER — OZEMPIC (1 MG/DOSE) 4 MG/3ML ~~LOC~~ SOPN: PEN_INJECTOR | SUBCUTANEOUS | 2 refills | Status: DC

## 2020-01-23 MED ORDER — LOSARTAN POTASSIUM 100 MG PO TABS: 100.0000 mg | ORAL_TABLET | Freq: Every day | ORAL | 1 refills | Status: DC

## 2020-01-24 ENCOUNTER — Other Ambulatory Visit: Payer: Self-pay

## 2020-01-24 NOTE — Telephone Encounter (Signed)
The pt was notified that her prescriptions were faxed to [facility] [address].

## 2020-01-26 ENCOUNTER — Other Ambulatory Visit: Payer: Self-pay

## 2020-01-26 DIAGNOSIS — I10 Essential (primary) hypertension: Secondary | ICD-10-CM

## 2020-01-26 MED ORDER — LOSARTAN POTASSIUM 100 MG PO TABS: 100.0000 mg | ORAL_TABLET | Freq: Every day | ORAL | 1 refills | Status: DC

## 2020-02-19 ENCOUNTER — Telehealth: Payer: Self-pay

## 2020-02-19 ENCOUNTER — Ambulatory Visit: Payer: PRIVATE HEALTH INSURANCE | Admitting: Internal Medicine

## 2020-02-19 NOTE — Telephone Encounter (Signed)
The patient was contacted and her appt was rescheduled because her provider is out sick.  The pt agreed to see Ramen, NP for her visit later this week.

## 2020-02-23 ENCOUNTER — Ambulatory Visit (INDEPENDENT_AMBULATORY_CARE_PROVIDER_SITE_OTHER): Payer: Medicare Other | Admitting: Nurse Practitioner

## 2020-02-23 ENCOUNTER — Other Ambulatory Visit: Payer: Self-pay

## 2020-02-23 ENCOUNTER — Encounter: Payer: Self-pay | Admitting: Nurse Practitioner

## 2020-02-23 VITALS — BP 124/70 | HR 101 | Temp 98.0°F | Ht 61.6 in | Wt 186.6 lb

## 2020-02-23 DIAGNOSIS — I10 Essential (primary) hypertension: Secondary | ICD-10-CM

## 2020-02-23 DIAGNOSIS — E1165 Type 2 diabetes mellitus with hyperglycemia: Secondary | ICD-10-CM

## 2020-02-23 DIAGNOSIS — E1169 Type 2 diabetes mellitus with other specified complication: Secondary | ICD-10-CM | POA: Diagnosis not present

## 2020-02-23 DIAGNOSIS — Z0000 Encounter for general adult medical examination without abnormal findings: Secondary | ICD-10-CM | POA: Diagnosis not present

## 2020-02-23 DIAGNOSIS — E7800 Pure hypercholesterolemia, unspecified: Secondary | ICD-10-CM

## 2020-02-23 DIAGNOSIS — Z1231 Encounter for screening mammogram for malignant neoplasm of breast: Secondary | ICD-10-CM | POA: Diagnosis not present

## 2020-02-23 DIAGNOSIS — Z23 Encounter for immunization: Secondary | ICD-10-CM

## 2020-02-23 DIAGNOSIS — E119 Type 2 diabetes mellitus without complications: Secondary | ICD-10-CM

## 2020-02-23 MED ORDER — PNEUMOCOCCAL 13-VAL CONJ VACC IM SUSP
0.5000 mL | INTRAMUSCULAR | 0 refills | Status: AC
Start: 2020-02-23 — End: 2020-02-23

## 2020-02-23 NOTE — Progress Notes (Signed)
I,Yamilka [employer] as a Education administrator for [employer], FNP.,have documented all relevant documentation on the behalf of Minette Brine, FNP,as directed by  Minette Brine, FNP while in the presence of Minette Brine, [place].  This visit occurred during the SARS-CoV-2 public health emergency.  Safety protocols were in place, including screening questions prior to the visit, additional usage of staff PPE, and extensive cleaning of exam room while observing appropriate contact time as indicated for disinfecting solutions.  Subjective:     Patient ID: Whitney Sanchez , female    DOB: 17-May-1955 , 65 y.o.   MRN: [id number]   Chief Complaint  Patient presents with  . welcome to medicare    HPI  Patient presents today for a welcome to medicare.     Past Medical History:  Diagnosis Date  . DM (diabetes mellitus) ([place])   . High cholesterol   . HTN (hypertension)      Family History  Problem Relation Age of Onset  . Diabetes Mother   . Healthy Father   . Breast cancer Neg Hx      Current Outpatient Medications:  .  cyclobenzaprine (FLEXERIL) 5 MG tablet, One tab po qhs prn, Disp: 30 tablet, Rfl: 0 .  losartan (COZAAR) 100 MG tablet, Take 1 tablet (100 mg total) by mouth daily., Disp: 90 tablet, Rfl: 1 .  Pitavastatin Calcium (LIVALO) 4 MG TABS, Take 1 tablet (4 mg total) by mouth daily., Disp: 90 tablet, Rfl: 1 .  Semaglutide, 1 MG/DOSE, (OZEMPIC, 1 MG/DOSE,) 4 MG/3ML SOPN, DIAL AND INJECT UNDER THE SKIN 1 MG WEEKLY, Disp: 6 mL, Rfl: 2   No Known Allergies   Review of Systems  Constitutional: Negative.   HENT: Negative.   Eyes: Negative.   Respiratory: Negative.   Cardiovascular: Negative.   Gastrointestinal: Negative.   Endocrine: Negative.   Genitourinary: Negative.   Musculoskeletal: Negative.   Skin: Negative.   Neurological: Negative.   Hematological: Negative.   Psychiatric/Behavioral: Negative.      Today's Vitals   02/23/20 0956  BP: 124/70  Pulse: (!) 101   Temp: 98 F (36.7 C)  TempSrc: Oral  Weight: 186 lb 9.6 oz (84.6 kg)  Height: 5' 1.6" (1.565 m)  PainSc: 0-No pain   Body mass index is 34.57 kg/m.   Objective:  Physical Exam Constitutional:      Appearance: Normal appearance. She is obese.  Cardiovascular:     Rate and Rhythm: Normal rate and regular rhythm.     Pulses: Normal pulses.     Heart sounds: Normal heart sounds.  Pulmonary:     Effort: Pulmonary effort is normal.     Breath sounds: Normal breath sounds.  Musculoskeletal:        General: Normal range of motion.     Cervical back: Normal range of motion and neck supple.  Skin:    General: Skin is warm and dry.     Capillary Refill: Capillary refill takes less than 2 seconds.     Coloration: Skin is not jaundiced.  Neurological:     General: No focal deficit present.     Mental Status: She is alert and oriented to person, place, and time.  Psychiatric:        Mood and Affect: Mood normal.        Behavior: Behavior normal.        Thought Content: Thought content normal.        Judgment: Judgment normal.  Assessment And Plan:     1. Welcome to Medicare preventive visit  Pt's annual wellness exam was performed and geriatric assessment reviewed.   Pt has no new identiafble wellness concerns at this time.   WIll obtain routine labs.   Will obtain UA and micro.   Behavior modifications discussed and diet history reviewed. Pt will continue to exercise regularly and modify diet, with low GI, plant based foods and decrease food intake of processed foods.   Recommend intake of daily multivitamin, Vitamin D, and calcium.  Recommend mammogram and colonoscopy (up to date) for preventive screenings, as well as recommend immunizations that include influenza (up to date) and TDAP - EKG 12-Lead  2. Encounter for screening mammogram for malignant neoplasm of breast  Pt instructed on Self Breast Exam.According to ACOG guidelines Women aged 62 and older are  recommended to get an annual mammogram.referral sent to [facility] for appointment scheduling.   Pt encouraged to get annual mammogram - MM Digital Screening; Future  3. Essential hypertension, benign . B/P is controlled.  . CMP ordered to check renal function.  . The importance of regular exercise and dietary modification was stressed to the patient.  . Stressed importance of losing ten percent of her body weight to help with B/P control.  . The weight loss would help with decreasing cardiac and cancer risk as well.  . EKG done with NSR HR 95 - CMP14+EGFR  4.  type 2 diabetes mellitus without complication, without long term insulin use(HCC)  Chronic, controlled  Continue with current medications  Encouraged to limit intake of sugary foods and drinks  Encouraged to increase physical activity to 150 minutes per week  Diabetic foot exam done, no abnormal findings  Eye exam is up to date (she has had her eye exam done need to get records) - Hemoglobin A1c  5. Pure hypercholesterolemia  Chronic, controlled  Continue with current medications - Lipid panel  6. Encounter for immunization  - pneumococcal 13-valent conjugate vaccine (PREVNAR 13) SUSP injection; Inject 0.5 mLs into the muscle tomorrow at 10 am for 1 dose.  Dispense: 0.5 mL; Refill: 0    Patient was given opportunity to ask questions. Patient verbalized understanding of the plan and was able to repeat key elements of the plan. All questions were answered to their satisfaction.  Minette Brine, FNP   I, Minette Brine, FNP, have reviewed all documentation for this visit. The documentation on 02/23/20 for the exam, diagnosis, procedures, and orders are all accurate and complete.  THE PATIENT IS ENCOURAGED TO PRACTICE SOCIAL DISTANCING DUE TO THE COVID-19 PANDEMIC.     Subjective:    Whitney Sanchez is a 65 y.o. female who presents for a Welcome to Medicare exam.   Review of Systems  Cardiac Risk  Factors include: advanced age (>15mn, >>55women);hypertension;sedentary lifestyle;obesity (BMI >30kg/m2)      Objective:    Today's Vitals   02/23/20 0956  BP: 124/70  Pulse: (!) 101  Temp: 98 F (36.7 C)  TempSrc: Oral  Weight: 186 lb 9.6 oz (84.6 kg)  Height: 5' 1.6" (1.565 m)  PainSc: 0-No pain  Body mass index is 34.57 kg/m.  Medications Outpatient Encounter Medications as of 02/23/2020  Medication Sig  . cyclobenzaprine (FLEXERIL) 5 MG tablet One tab po qhs prn  . losartan (COZAAR) 100 MG tablet Take 1 tablet (100 mg total) by mouth daily.  . Pitavastatin Calcium (LIVALO) 4 MG TABS Take 1 tablet (4 mg total)  by mouth daily.  . Semaglutide, 1 MG/DOSE, (OZEMPIC, 1 MG/DOSE,) 4 MG/3ML SOPN DIAL AND INJECT UNDER THE SKIN 1 MG WEEKLY  . [DISCONTINUED] HYDROcodone-acetaminophen (NORCO/VICODIN) 5-325 MG tablet Take 1 tablet by mouth every 6 (six) hours as needed for moderate pain. (Patient not taking: No sig reported)   No facility-administered encounter medications on file as of 02/23/2020.     History: Past Medical History:  Diagnosis Date  . DM (diabetes mellitus) ([place])   . High cholesterol   . HTN (hypertension)    Past Surgical History:  Procedure Laterality Date  . ABDOMINAL HYSTERECTOMY  1996   total    Family History  Problem Relation Age of Onset  . Diabetes Mother   . Healthy Father   . Breast cancer Neg Hx    Social History   Occupational History  . Not on file  Tobacco Use  . Smoking status: Never Smoker  . Smokeless tobacco: Never Used  Vaping Use  . Vaping Use: Never used  Substance and Sexual Activity  . Alcohol use: Never  . Drug use: Never  . Sexual activity: Not on file    Tobacco Counseling Counseling given: Not Answered   Immunizations and Health Maintenance Immunization History  Administered Date(s) Administered  . Fluad Quad(high Dose 65+) 10/18/2019  . Influenza-Unspecified 10/19/2017  . Moderna SARS-COV2 Booster Vaccination  11/17/2019  . Moderna Sars-Covid-2 Vaccination 01/21/2019, 02/18/2019  . Pneumococcal Polysaccharide-23 02/05/2015   Health Maintenance Due  Topic Date Due  . OPHTHALMOLOGY EXAM  Never done  . PNA vac Low Risk Adult (1 of 2 - PCV13) 06/01/2018  . MAMMOGRAM  10/14/2018  . FOOT EXAM  10/10/2019    Activities of Daily Living In your present state of health, do you have any difficulty performing the following activities: 02/23/2020  Hearing? N  Vision? N  Difficulty concentrating or making decisions? N  Walking or climbing stairs? N  Dressing or bathing? N  Doing errands, shopping? N  Preparing Food and eating ? N  Using the Toilet? N  In the past six months, have you accidently leaked urine? N  Do you have problems with loss of bowel control? N  Managing your Medications? N  Managing your Finances? N  Housekeeping or managing your Housekeeping? N  Some recent data might be hidden    Physical Exam  (optional), or other factors deemed appropriate based on the beneficiary's medical and social history and current clinical standards.  Advanced Directives: Does Patient Have a Medical Advance Directive?: No    Assessment:    This is a routine wellness examination for this patient .   Vision/Hearing screen  Hearing Screening   '125Hz'  '250Hz'  '500Hz'  '1000Hz'  '2000Hz'  '3000Hz'  '4000Hz'  '6000Hz'  '8000Hz'   Right ear:   NR NR 40  40    Left ear:   40 NR 25  40      Visual Acuity Screening   Right eye Left eye Both eyes  Without correction: '20/20 20/30 20/20 '  With correction:       Dietary issues and exercise activities discussed:  Current Exercise Habits: The patient does not participate in regular exercise at present, Exercise limited by: None identified  Goals    . Exercise 3x per week (30 min per time)     I would like to exercise at least 3-4 times a day      Depression Screen PHQ 2/9 Scores 10/18/2019 10/10/2018 06/20/2018 03/17/2018  PHQ - 2 Score 0 0 0 0  Fall Risk Fall Risk   02/23/2020  Falls in the past year? 0    Cognitive Function:     6CIT Screen 02/23/2020  What Year? 0 points  What month? 0 points  What time? 0 points  Count back from 20 0 points  Months in reverse 0 points  Repeat phrase 0 points  Total Score 0    Patient Care Team: Glendale Chard, MD as PCP - General (Internal Medicine)     Plan:    See above  I have personally reviewed and noted the following in the patient's chart:   . Medical and social history . Use of alcohol, tobacco or illicit drugs  . Current medications and supplements . Functional ability and status . Nutritional status . Physical activity . Advanced directives . List of other physicians . Hospitalizations, surgeries, and ER visits in previous 12 months . Vitals . Screenings to include cognitive, depression, and falls . Referrals and appointments  In addition, I have reviewed and discussed with patient certain preventive protocols, quality metrics, and best practice recommendations. A written personalized care plan for preventive services as well as general preventive health recommendations were provided to patient.     Minette Brine, FNP 02/23/2020

## 2020-02-23 NOTE — Patient Instructions (Signed)
Whitney Sanchez , Thank you for taking time to come for your Medicare Wellness Visit. I appreciate your ongoing commitment to your health goals. Please review the following plan we discussed and let me know if I can assist you in the future.   These are the goals we discussed: Goals    . Exercise 3x per week (30 min per time)     I would like to exercise at least 3-4 times a day       This is a list of the screening recommended for you and due dates:  Health Maintenance  Topic Date Due  . Eye exam for diabetics  Never done  . Pneumonia vaccines (1 of 2 - PCV13) 06/01/2018  . Mammogram  10/14/2018  . Hemoglobin A1C  03/26/2020  . Complete foot exam   02/22/2021  . Tetanus Vaccine  04/02/2024  . Colon Cancer Screening  07/18/2024  . Flu Shot  Completed  . DEXA scan (bone density measurement)  Completed  . COVID-19 Vaccine  Completed  .  Hepatitis C: One time screening is recommended by Center for Disease Control  (CDC) for  adults born from [birth year] through 1967.   Completed     Critical care medicine: Principles of diagnosis and management in the adult (4th ed., pp. 3810-1751). Saunders."> Miller's anesthesia (8th ed., pp. 232-250). Saunders.">  Advance Directive  Advance directives are legal documents that allow you to make decisions about your health care and medical treatment in case you become unable to communicate for yourself. Advance directives let your wishes be known to family, friends, and health care providers. Discussing and writing advance directives should happen over time rather than all at once. Advance directives can be changed and updated at any time. There are different types of advance directives, such as:  Medical power of attorney.  Living will.  Do not resuscitate (DNR) order or do not attempt resuscitation (DNAR) order. Health care proxy and medical power of attorney A health care proxy is also called a health care agent. This person is appointed to make  medical decisions for you when you are unable to make decisions for yourself. Generally, people ask a trusted friend or family member to act as their proxy and represent their preferences. Make sure you have an agreement with your trusted person to act as your proxy. A proxy may have to make a medical decision on your behalf if your wishes are not known. A medical power of attorney, also called a durable power of attorney for health care, is a legal document that names your health care proxy. Depending on the laws in your state, the document may need to be:  Signed.  Notarized.  Dated.  Copied.  Witnessed.  Incorporated into your medical record. You may also want to appoint a trusted person to manage your money in the event you are unable to do so. This is called a durable power of attorney for finances. It is a separate legal document from the durable power of attorney for health care. You may choose your health care proxy or someone different to act as your agent in money matters. If you do not appoint a proxy, or there is a concern that the proxy is not acting in your best interest, a court may appoint a guardian to act on your behalf. Living will A living will is a set of instructions that state your wishes about medical care when you cannot express them yourself. Health care providers should  keep a copy of your living will in your medical record. You may want to give a copy to family members or friends. To alert caregivers in case of an emergency, you can place a card in your wallet to let them know that you have a living will and where they can find it. A living will is used if you become:  Terminally ill.  Disabled.  Unable to communicate or make decisions. The following decisions should be included in your living will:  To use or not to use life support equipment, such as dialysis machines and breathing machines (ventilators).  Whether you want a DNR or DNAR order. This tells  health care providers not to use cardiopulmonary resuscitation (CPR) if breathing or heartbeat stops.  To use or not to use tube feeding.  To be given or not to be given food and fluids.  Whether you want comfort (palliative) care when the goal becomes comfort rather than a cure.  Whether you want to donate your organs and tissues. A living will does not give instructions for distributing your money and property if you should pass away. DNR or DNAR A DNR or DNAR order is a request not to have CPR in the event that your heart stops beating or you stop breathing. If a DNR or DNAR order has not been made and shared, a health care provider will try to help any patient whose heart has stopped or who has stopped breathing. If you plan to have surgery, talk with your health care provider about how your DNR or DNAR order will be followed if problems occur. What if I do not have an advance directive? Some states assign family decision makers to act on your behalf if you do not have an advance directive. Each state has its own laws about advance directives. You may want to check with your health care provider, attorney, or state representative about the laws in your state. Summary  Advance directives are legal documents that allow you to make decisions about your health care and medical treatment in case you become unable to communicate for yourself.  The process of discussing and writing advance directives should happen over time. You can change and update advance directives at any time.  Advance directives may include a medical power of attorney, a living will, and a DNR or DNAR order. This information is not intended to replace advice given to you by your health care provider. Make sure you discuss any questions you have with your health care provider. Document Revised: 10/10/2019 Document Reviewed: 10/10/2019 Elsevier Patient Education  2021 [employer].

## 2020-02-24 LAB — CMP14+EGFR
ALT: 22 IU/L (ref 0–32)
AST: 20 IU/L (ref 0–40)
Albumin/Globulin Ratio: 1.8 (ref 1.2–2.2)
Albumin: 4.7 g/dL (ref 3.8–4.8)
Alkaline Phosphatase: 83 IU/L (ref 44–121)
BUN/Creatinine Ratio: 17 (ref 12–28)
BUN: 15 mg/dL (ref 8–27)
Bilirubin Total: 0.3 mg/dL (ref 0.0–1.2)
CO2: 26 mmol/L (ref 20–29)
Calcium: 10 mg/dL (ref 8.7–10.3)
Chloride: 104 mmol/L (ref 96–106)
Creatinine, Ser: 0.87 mg/dL (ref 0.57–1.00)
GFR calc Af Amer: 80 mL/min/{1.73_m2} (ref >59)
GFR calc non Af Amer: 70 mL/min/{1.73_m2} (ref >59)
Globulin, Total: 2.6 g/dL (ref 1.5–4.5)
Glucose: 86 mg/dL (ref 65–99)
Potassium: 4.8 mmol/L (ref 3.5–5.2)
Sodium: 143 mmol/L (ref 134–144)
Total Protein: 7.3 g/dL (ref 6.0–8.5)

## 2020-02-24 LAB — LIPID PANEL
Chol/HDL Ratio: 2.8 ratio (ref 0.0–4.4)
Cholesterol, Total: 150 mg/dL (ref 100–199)
HDL: 53 mg/dL (ref >39)
LDL Chol Calc (NIH): 60 mg/dL (ref 0–99)
Triglycerides: 229 mg/dL — ABNORMAL HIGH (ref 0–149)
VLDL Cholesterol Cal: 37 mg/dL (ref 5–40)

## 2020-02-24 LAB — HEMOGLOBIN A1C
Est. average glucose Bld gHb Est-mCnc: 123 mg/dL
Hgb A1c MFr Bld: 5.9 % — ABNORMAL HIGH (ref 4.8–5.6)

## 2020-05-23 ENCOUNTER — Other Ambulatory Visit: Payer: Self-pay | Admitting: Internal Medicine

## 2020-05-23 DIAGNOSIS — I10 Essential (primary) hypertension: Secondary | ICD-10-CM

## 2020-05-27 ENCOUNTER — Other Ambulatory Visit: Payer: Self-pay

## 2020-05-27 DIAGNOSIS — I10 Essential (primary) hypertension: Secondary | ICD-10-CM

## 2020-05-27 MED ORDER — LOSARTAN POTASSIUM 100 MG PO TABS: 1.0000 | ORAL_TABLET | Freq: Every day | ORAL | 2 refills | Status: DC

## 2020-05-28 ENCOUNTER — Telehealth: Payer: Self-pay

## 2020-05-28 ENCOUNTER — Telehealth: Payer: Self-pay | Admitting: *Deleted

## 2020-05-28 NOTE — Telephone Encounter (Signed)
I called and scheduled the pt an appt to speak with Dr. [name] the pharmacist so that the pt can discuss some patient assistance options for her medications.

## 2020-05-28 NOTE — Chronic Care Management (AMB) (Signed)
  Chronic Care Management   Note  05/28/2020 Name: Whitney Sanchez MRN: [id number] DOB: 02-20-1955  Whitney Sanchez is a 65 y.o. year old female who is a primary care patient of Glendale Chard, MD. I reached out to [place] by phone today in response to a referral sent by Ms. Naphtali W Loveridge's PCP, Dr. Baird Cancer.     Ms. Ginther was given information about Chronic Care Management services today including:  1. CCM service includes personalized support from designated clinical staff supervised by her physician, including individualized plan of care and coordination with other care providers 2. 24/7 contact phone numbers for assistance for urgent and routine care needs. 3. Service will only be billed when office clinical staff spend 20 minutes or more in a month to coordinate care. 4. Only one practitioner may furnish and bill the service in a calendar month. 5. The patient may stop CCM services at any time (effective at the end of the month) by phone call to the office staff. 6. The patient will be responsible for cost sharing (co-pay) of up to 20% of the service fee (after annual deductible is met).  Patient agreed to services and verbal consent obtained.   Follow up plan: Telephone appointment with care management team member scheduled for:06/12/2020  Starsha Morning  Care Guide, Embedded Care Coordination [place]  Care Management

## 2020-05-28 NOTE — Chronic Care Management (AMB) (Signed)
  Chronic Care Management   Outreach Note  05/28/2020 Name: Whitney Sanchez MRN: [id number] DOB: [birth year]  [name] is a 65 y.o. year old female who is a primary care patient of [name], MD. I reached out to [facility] by phone today in response to a referral sent by [name]'s PCP, [name].     An unsuccessful telephone outreach was attempted today. The patient was referred to the case management team for assistance with care management and care coordination.   Follow Up Plan: A HIPAA compliant phone message was left for the patient providing contact information and requesting a return call. The care management team will reach out to the patient again over the next 7 days.  If patient returns call to Provider office, please advise to call Embedded Care Management Care Guide [facility].* at [phone].  Gwenevere Ghazi  Care Guide, Embedded Care Coordination [facility] Management

## 2020-05-29 ENCOUNTER — Ambulatory Visit: Payer: Self-pay

## 2020-05-29 LAB — HM DIABETES EYE EXAM

## 2020-05-30 ENCOUNTER — Telehealth: Payer: Self-pay

## 2020-05-30 NOTE — Chronic Care Management (AMB) (Signed)
Chronic Care Management Pharmacy Assistant   Name: Whitney Sanchez  MRN: [id number] DOB: 10-10-1955  Reason for Encounter: Chart Review for CPP visit on 06/12/2020/ Patient Assistance Coordination  05/30/2020- Patient assistance forms filled out for Ozempic with [employer] patient assistance program and for Livalo with [employer] patient assistance program. 06/05/2020 patient aware forms are ready for her to sign and income documentation needed. Patient will go to PCP office on Thursday or next week to sign, awaiting provider signature also in order to fax. [name], CPP notified.   Conditions to be addressed/monitored: HTN, HLD and DMII   Primary concerns for visit include: Medication Review  Recent office visits:  02/23/2020- Whitney Felts, FNP (PCP) Welcome to [facility] Preventative Visit, Behavior modifications discussed and diet history reviewed. Pt will continue to exercise regularly and modify diet, with low GI, plant based foods and decrease food intake of processed foods. Recommend intake of daily multivitamin, Vitamin D, and calcium. Encouraged to limit intake of sugary foods and drinks, Encouraged to increase physical activity to 150 minutes per week. Recommend mammogram and colonoscopy (up to date) for preventive screenings, as well as recommend immunizations that include influenza (up to date) and TDAP.  Continue with current medications for DM, Hypertension and Cholesterol. Lab ordered: CMP, Hemoglobin A1C and Lipid Panel.  Diabetic foot exam done, no abnormal findings, Eye exam is up to date (she has had her eye exam done need to get records). Pneumococcal 13-valent conjugate vaccine (PREVNAR 13) given at visit. Hydrocodone- Acetaminophen 5/325 mg discontinued- patient reports not taking.    Recent consult visits:  None  Hospital visits:  None in previous 6 months  Have you seen any other providers since your last visit? Patient has not seen any other  providers in the past 6 months.   Any changes in your medications or health? No changes in medications or health per patient.  Any side effects from any medications? Patient stated she does not have any side effects from her medications.   Do you have an symptoms or problems not managed by your medications? Patient can not think of any symptoms are problem not managed by her medications.   Any concerns about your health right now? Patient does not have any health concerns at this time.  Has your provider asked that you check blood pressure, blood sugar, or follow special diet at home? Yes, patient is to check blood pressures and blood sugars daily but patient admits to not checking all the time.  Do you get any type of exercise on a regular basis? Patient states she tries to walk at least twice a week.   Can you think of a goal you would like to reach for your health? Patient states weight loss is a health goal she would like to reach.  Do you have any problems getting your medications? Patient is having problems getting Ozempic and Livalo due to cost. Patient assistance applications filled out, patient aware, she will go to PCP office to sign and bring income documentation.  Is there anything that you would like to discuss during the appointment? No, patient did not have anything in particular she wanted to discuss during appointment.   Patient aware to have all medications, supplements, blood pressure and blood sugars logs near during telephone appointment.  Medications: Outpatient Encounter Medications as of 05/30/2020  Medication Sig  . cyclobenzaprine (FLEXERIL) 5 MG tablet One tab po qhs prn  . losartan (COZAAR) 100 MG tablet Take  1 tablet (100 mg total) by mouth daily.  . Pitavastatin Calcium (LIVALO) 4 MG TABS Take 1 tablet (4 mg total) by mouth daily.  . Semaglutide, 1 MG/DOSE, (OZEMPIC, 1 MG/DOSE,) 4 MG/3ML SOPN DIAL AND INJECT UNDER THE SKIN 1 MG WEEKLY   No  facility-administered encounter medications on file as of 05/30/2020.   FILL HISTORY: Cyclobenzaprine 5 mg- Last filled 10/18/19 for 30 day supply. Ozempic 1 mg- Last filled 05/24/2020 for 30 day supply  Losartan 100 mg- Last filled 03/31/2020 for 90 day supply  Pitavastatin (Livalo)- Last filled 03/29/2020 for 30 day supply    Star Rating Drugs: Ozempic 1 mg- Last filled 05/24/2020 for 30 day supply at [facility] (PAP in progress) Losartan 100 mg- Last filled 03/31/2020 for 90 day supply at [facility]. Pitavastatin (Livalo)- Last filled 03/29/2020 for 30 day supply at [facility] (PAP in progress)  SIG: [name], [facility] Concierge [phone]

## 2020-06-11 ENCOUNTER — Telehealth: Payer: Self-pay

## 2020-06-11 NOTE — Chronic Care Management (AMB) (Signed)
  Patient aware of telephone appointment with [name] [facility] on 06-12-2020 at 2:00. Patient aware to have/bring all medications, supplements, blood pressure and/or blood sugar logs to visit.  Questions: Have you had any recent office visit or specialist visit outside of [facility]? Patient stated no  Are there any concerns you would like to discuss during your office visit? Patient stated no health concerns.  Are you having any problems obtaining your medications? Patient stated Ozempic is too expensive right now.  Star Rating Drug: Ozempic 1 mg- Last filled 05/24/2020 for 30 day supply at [facility] (PAP in progress) Losartan 100 mg- Last filled 03/31/2020 for 90 day supply at [facility]. Pitavastatin (Livalo)- Last filled 03/29/2020 for 30 day supply at [facility] (PAP in progress)  Any gaps in medications fill history? No   [name] [facility] Clinical Pharmacist Assistant [phone]

## 2020-06-11 NOTE — Progress Notes (Addendum)
Chronic Care Management Pharmacy Note  06/12/2020 Name:  Whitney Sanchez MRN:  [id number] DOB:  Mar 31, 1955  Subjective: Whitney Sanchez is an 65 y.o. year old female who is a primary patient of Glendale Chard, MD.  The CCM team was consulted for assistance with disease management and care coordination needs.  She has 3 children and 7 grandchildren. She goes by [place]. She is retired. Usually she cleans her house and cooks. She goes out everyday and at least goes into the store and walks around. She retired from [employer] on [employer]. She was there for over 34 years, and she feels bored. She is thinking about doing a part time job, that will keep her busy. She worked in [employer].   Engaged with patient by telephone for initial visit in response to provider referral for pharmacy case management and/or care coordination services.   Consent to Services:  The patient was given the following information about Chronic Care Management services today, agreed to services, and gave verbal consent: 1. CCM service includes personalized support from designated clinical staff supervised by the primary care provider, including individualized plan of care and coordination with other care providers 2. 24/7 contact phone numbers for assistance for urgent and routine care needs. 3. Service will only be billed when office clinical staff spend 20 minutes or more in a month to coordinate care. 4. Only one practitioner may furnish and bill the service in a calendar month. 5.The patient may stop CCM services at any time (effective at the end of the month) by phone call to the office staff. 6. The patient will be responsible for cost sharing (co-pay) of up to 20% of the service fee (after annual deductible is met). Patient agreed to services and consent obtained.  Patient Care Team: Glendale Chard, MD as PCP - General (Internal Medicine) Mayford Knife, [facility] (Pharmacist)  Recent office  visits: 10/18/2019 PCP OV   Recent consult visits: 02/23/2020 [facility] to [facility] visits: None in previous 6 months  Objective:  Lab Results  Component Value Date   CREATININE 0.87 02/23/2020   BUN 15 02/23/2020   GFRNONAA 70 02/23/2020   GFRAA 80 02/23/2020   NA 143 02/23/2020   K 4.8 02/23/2020   CALCIUM 10.0 02/23/2020   CO2 26 02/23/2020   GLUCOSE 86 02/23/2020    Lab Results  Component Value Date/Time   HGBA1C 5.9 (H) 02/23/2020 11:33 AM   HGBA1C 5.6 09/27/2019 10:11 AM   HGBA1C 6.4 08/16/2017 12:00 AM   MICROALBUR 10 10/10/2018 04:56 PM    Last diabetic Eye exam: No results found for: HMDIABEYEEXA  Last diabetic Foot exam: No results found for: HMDIABFOOTEX   Lab Results  Component Value Date   CHOL 150 02/23/2020   HDL 53 02/23/2020   LDLCALC 60 02/23/2020   TRIG 229 (H) 02/23/2020   CHOLHDL 2.8 02/23/2020    Hepatic Function Latest Ref Rng & Units 02/23/2020 09/27/2019 06/15/2019  Total Protein 6.0 - 8.5 g/dL 7.3 7.4 6.6  Albumin 3.8 - 4.8 g/dL 4.7 4.8 4.2  AST 0 - 40 IU/L '20 21 18  ' ALT 0 - 32 IU/L '22 21 14  ' Alk Phosphatase 44 - 121 IU/L 83 80 76  Total Bilirubin 0.0 - 1.2 mg/dL 0.3 0.4 0.3    No results found for: TSH, FREET4  CBC Latest Ref Rng & Units 09/27/2019 10/10/2018  WBC 3.4 - 10.8 x10E3/uL 5.9 8.2  Hemoglobin 11.1 - 15.9  g/dL 12.1 11.1  Hematocrit 34.0 - 46.6 % 36.9 34.6  Platelets 150 - 450 x10E3/uL 276 284    Lab Results  Component Value Date/Time   VD25OH 36.9 11/10/2016 12:00 AM    Clinical ASCVD: No  The 10-year ASCVD risk score Mikey Bussing DC Jr., et al., 2013) is: 16.8%   Values used to calculate the score:     Age: 61 years     Sex: Female     Is Non-Hispanic African American: Yes     Diabetic: Yes     Tobacco smoker: No     Systolic Blood Pressure: 161 mmHg     Is BP treated: Yes     HDL Cholesterol: 53 mg/dL     Total Cholesterol: 150 mg/dL    Depression screen [facility] 2/9 10/18/2019 10/10/2018 06/20/2018  Decreased  Interest 0 0 0  Down, Depressed, Hopeless 0 0 0  PHQ - 2 Score 0 0 0     Social History   Tobacco Use  Smoking Status Never Smoker  Smokeless Tobacco Never Used   BP Readings from Last 3 Encounters:  02/23/20 124/70  10/18/19 124/76  09/27/19 140/88   Pulse Readings from Last 3 Encounters:  02/23/20 (!) 101  10/18/19 86  09/27/19 73   Wt Readings from Last 3 Encounters:  02/23/20 186 lb 9.6 oz (84.6 kg)  10/18/19 187 lb 12.8 oz (85.2 kg)  09/27/19 187 lb (84.8 kg)   BMI Readings from Last 3 Encounters:  02/23/20 34.57 kg/m  10/18/19 34.80 kg/m  09/27/19 34.65 kg/m    Assessment/Interventions: Review of patient past medical history, allergies, medications, health status, including review of consultants reports, laboratory and other test data, was performed as part of comprehensive evaluation and provision of chronic care management services.   SDOH:  (Social Determinants of Health) assessments and interventions performed: Yes SDOH Interventions   Flowsheet Row Most Recent Value  SDOH Interventions   Financial Strain Interventions Other (Comment)  [apply for patient assistance through drug companies.]     SDOH Screenings   Alcohol Screen: Not on file  Depression (PHQ2-9): Low Risk   . PHQ-2 Score: 0  Financial Resource Strain: Medium Risk  . Difficulty of Paying Living Expenses: Somewhat hard  Food Insecurity: No Food Insecurity  . Worried About Charity fundraiser in the Last Year: Never true  . Ran Out of Food in the Last Year: Never true  Housing: Low Risk   . Last Housing Risk Score: 0  Physical Activity: Insufficiently Active  . Days of Exercise per Week: 2 days  . Minutes of Exercise per Session: 20 min  Social Connections: Moderately Integrated  . Frequency of Communication with Friends and Family: More than three times a week  . Frequency of Social Gatherings with Friends and Family: Once a week  . Attends Religious Services: More than 4 times per  year  . Active Member of Clubs or Organizations: No  . Attends Archivist Meetings: Never  . Marital Status: Married  Stress: No Stress Concern Present  . Feeling of Stress : Not at all  Tobacco Use: Low Risk   . Smoking Tobacco Use: Never Smoker  . Smokeless Tobacco Use: Never Used  Transportation Needs: Unknown  . Lack of Transportation (Medical): No  . Lack of Transportation (Non-Medical): Not on file    CCM Care Plan  No Known Allergies  Medications Reviewed Today    Reviewed by Mayford Knife, [facility] (Pharmacist) on 06/12/20  at [place] List Status: <None>  Medication Order Taking? Sig Documenting Provider Last Dose Status Informant  cyclobenzaprine (FLEXERIL) 5 MG tablet [id number] Yes One tab po qhs prn Glendale Chard, MD Taking Active   losartan (COZAAR) 100 MG tablet [id number] Yes Take 1 tablet (100 mg total) by mouth daily. Glendale Chard, MD Taking Active   Pitavastatin Calcium (LIVALO) 4 MG TABS [id number] Yes Take 1 tablet (4 mg total) by mouth daily. Glendale Chard, MD Taking Active   Semaglutide, 1 MG/DOSE, (OZEMPIC, 1 MG/DOSE,) 4 MG/3ML Bonney Aid [id number] Yes DIAL AND INJECT UNDER THE SKIN 1 MG WEEKLY Glendale Chard, MD Taking Active           Patient Active Problem List   Diagnosis Date Noted  . Uncontrolled type 2 diabetes mellitus with hyperglycemia ([place]) 03/17/2018  . Essential hypertension, benign 03/17/2018  . Pure hypercholesterolemia 03/17/2018    Immunization History  Administered Date(s) Administered  . Fluad Quad(high Dose 65+) 10/18/2019  . Influenza-Unspecified 10/19/2017  . Moderna SARS-COV2 Booster Vaccination 11/17/2019  . Moderna Sars-Covid-2 Vaccination 01/21/2019, 02/18/2019  . Pneumococcal Polysaccharide-23 02/05/2015    Conditions to be addressed/monitored:  Hypertension, Hyperlipidemia and Diabetes  Care Plan : [place]  Updates made by Mayford Knife, RPH since 06/12/2020 12:00 AM    Problem: HTN, HLD, DMI  II   Priority: High    Goal: Disease Management   This Visit's Progress: On track  Priority: High  Note:    Current Barriers:  . Unable to independently monitor therapeutic efficacy  Pharmacist Clinical Goal(s):  Marland Kitchen Patient will verbalize ability to afford treatment regimen . achieve adherence to monitoring guidelines and medication adherence to achieve therapeutic efficacy through collaboration with PharmD and provider.   Interventions: . 1:1 collaboration with Glendale Chard, MD regarding development and update of comprehensive plan of care as evidenced by provider attestation and co-signature . Inter-disciplinary care team collaboration (see longitudinal plan of care) . Comprehensive medication review performed; medication list updated in electronic medical record  Hypertension (BP goal <130/80) -Controlled -Current treatment: . Losartan 100 mg tablet once per day.  o Being sent a 90 day supply from [facility] - last filled 03/31/2020 -Current home readings: patient reports checking her BP infrequently.  -She sometimes checks it at [facility] and then at the doctors office.  -Recommended that patient order a BP cuff -Denies hypotensive/hypertensive symptoms -Educated on BP goals and benefits of medications for prevention of heart attack, stroke and kidney damage; Exercise goal of 150 minutes per week; Importance of home blood pressure monitoring;  -Patient is going to purchase her BP at home -Counseled to monitor BP at home at least two times per week, document, and provide log at future appointments -Recommended to continue current medication  Hyperlipidemia: (LDL goal < 70) -Not ideally controlled -Current treatment: . Livalo 4 mg tablet- take 1 tablet daily.  -Medications previously tried: none  -Current dietary patterns: please see the diabetes goal for more information  -Current exercise habits: please see the Diabetes goal for more information.  -Educated on Cholesterol  goals;  Benefits of statin for ASCVD risk reduction; Importance of limiting foods high in cholesterol; Exercise goal of 150 minutes per week;  -Patient has 4 sample tablets left. -Collaborate with PCP for patient to get samples of medication. -Patient has completed patient assistance application and Dr. Baird Cancer has signed, concierge has sent paperwork to drug company. -Recommended to continue current medication -Collaborate with PCP to determine if patients current statin  medication has enough benefit.   Diabetes (A1c goal <7%) -Controlled -Current medications: . Ozempic 1 mg once weekly on Friday  -Current home glucose readings . fasting glucose: sometimes uses husbands meter, 129 -Denies hypoglycemic/hyperglycemic symptoms -Current meal patterns:  . breakfast: eating eggs, a piece of bacon, and toast, and a glass or orange juice and coffee . lunch: sometimes a sandwich  . dinner: meat, vegetable, starch-baked potato, rice or pasta she usually tries to stick to one option and other days a salad . snacks: she does not eat a lot of snacks, sometimes she eats chips . drinks: water with lemon, when she has a taste for soda she drinks a zero sugar coke -Current exercise: she walks around her neighborhood for about 30 to 35 minutes, she does this twice out of the week.  She also has a treadmill and she is going to start using it once a week.  -Educated on A1c and blood sugar goals; Complications of diabetes including kidney damage, retinal damage, and cardiovascular disease; Exercise goal of 150 minutes per week; Prevention and management of hypoglycemic episodes;  -Patient would like a glucose monitor -Counseled to check feet daily and get yearly eye exams -Collaborate with PCP, so patient can receive glucose testing supplies.  -Recommended to continue current medication  Health Maintenance -Vaccine gaps:   - Pneumonia Vaccine - at this time she will let me know when she  interested.  -COVID-19 2 nd Booster eligible - at this time she is not interested in being signed up.  -Recommended to continue current medication  Patient Goals/Self-Care Activities . Patient will:  - take medications as prescribed collaborate with provider on medication access solutions  Follow Up Plan: The patient has been provided with contact information for the care management team and has been advised to call with any health related questions or concerns.       Medication Assistance: Application for Ozempic  medication assistance program. in process.  Anticipated assistance start date  06/2020.  See plan of care for additional detail.  Application for Livalo medication assistance program. in process.  Anticipated assistance start date  06/2020.  See plan of care for additional detail. Compliance/Adherence/Medication fill history: Care Gaps: Opthamology Eye Exam: done by [facility] in May around the 11, scanned the information into her chart  Star-Rating Drugs: Pitavastatin 4 mg tablet - she reports that she had some, and has since received samples from [facility], [facility] them  Losartan 100 mg 06/03/2020  Ozempic 1 mg - receiving sample [facility] the [facility]  [id number] [place] [place] [id number]: [phone]: [phone] Uses pill box? Yes Pt endorses 80% compliance  We discussed: Current pharmacy is preferred with insurance plan and patient is satisfied with pharmacy services Patient decided to: Continue current medication management strategy  Care Plan and Follow Up Patient Decision:  Patient agrees to Care Plan and Follow-up.  Plan: The patient has been provided with contact information for the care management team and has been advised to call with any health  related questions or concerns.   VOrlando Penner PharmD Clinical Pharmacist [facility] [phone]

## 2020-06-12 ENCOUNTER — Ambulatory Visit (INDEPENDENT_AMBULATORY_CARE_PROVIDER_SITE_OTHER): Payer: Medicare Other

## 2020-06-12 DIAGNOSIS — E119 Type 2 diabetes mellitus without complications: Secondary | ICD-10-CM | POA: Diagnosis not present

## 2020-06-12 DIAGNOSIS — I10 Essential (primary) hypertension: Secondary | ICD-10-CM | POA: Diagnosis not present

## 2020-06-12 DIAGNOSIS — E7800 Pure hypercholesterolemia, unspecified: Secondary | ICD-10-CM | POA: Diagnosis not present

## 2020-06-12 NOTE — Patient Instructions (Addendum)
Visit Information It was great speaking with you today!  Please let me know if you have any questions about our visit.  Goals Addressed            This Visit's Progress   . Manage My Medicine       Timeframe:  Long-Range Goal Priority:  High Start Date:                             Expected End Date:                       Follow Up Date 08/13/2020   - call for medicine refill 2 or 3 days before it runs out - call if I am sick and can't take my medicine - keep a list of all the medicines I take; vitamins and herbals too - learn to read medicine labels - use a pillbox to sort medicine    Why is this important?   . These steps will help you keep on track with your medicines.   Notes:  -Please make sure to check your mail for follow up from patient assistance for Livalo and Ozempic.        Patient Care Plan: CCM Pharmacy Care Plan    Problem Identified: HTN, HLD, DMI II   Priority: High    Goal: Disease Management   This Visit's Progress: On track  Priority: High  Note:    Current Barriers:  . Unable to independently monitor therapeutic efficacy  Pharmacist Clinical Goal(s):  Marland Kitchen Patient will verbalize ability to afford treatment regimen . achieve adherence to monitoring guidelines and medication adherence to achieve therapeutic efficacy through collaboration with PharmD and provider.   Interventions: . 1:1 collaboration with [name], MD regarding development and update of comprehensive plan of care as evidenced by provider attestation and co-signature . Inter-disciplinary care team collaboration (see longitudinal plan of care) . Comprehensive medication review performed; medication list updated in electronic medical record  Hypertension (BP goal <130/80) -Controlled -Current treatment: . Losartan 100 mg tablet once per day.  o Being sent a 90 day supply from [facility] - last filled 03/31/2020 -Current home readings: patient reports checking her BP  infrequently.  -She sometimes checks it at [facility] and then at the doctors office.  -Recommended that patient order a BP cuff -Denies hypotensive/hypertensive symptoms -Educated on BP goals and benefits of medications for prevention of heart attack, stroke and kidney damage; Exercise goal of 150 minutes per week; Importance of home blood pressure monitoring;  -Patient is going to purchase her BP at home -Counseled to monitor BP at home at least two times per week, document, and provide log at future appointments -Recommended to continue current medication  Hyperlipidemia: (LDL goal < 70) -Not Ideally controlled -Current treatment: . Livalo 4 mg tablet- take 1 tablet daily.  -Medications previously tried: none  -Current dietary patterns: please see the diabetes goal for more information  -Current exercise habits: please see the Diabetes goal for more information.  -Educated on Cholesterol goals;  Benefits of statin for ASCVD risk reduction; Importance of limiting foods high in cholesterol; Exercise goal of 150 minutes per week;  -Patient has 4 sample tablets left. -Collaborate with PCP for patient to get samples of medication. -Patient has completed patient assistance application and [name] has signed, concierge has sent paperwork to drug company. -Recommended to continue current medication -Collaborate with PCP to determine  if patients current statin medication has enough benefit.   Diabetes (A1c goal <7%) -Controlled -Current medications: . Ozempic 1 mg once weekly on Friday  -Current home glucose readings . fasting glucose: sometimes uses husbands meter, 129 -Denies hypoglycemic/hyperglycemic symptoms -Current meal patterns:  . breakfast: eating eggs, a piece of bacon, and toast, and a glass or orange juice and coffee . lunch: sometimes a sandwich  . dinner: meat, vegetable, starch-baked potato, rice or pasta she usually tries to stick to one option and other days a  salad . snacks: she does not eat a lot of snacks, sometimes she eats chips . drinks: water with lemon, when she has a taste for soda she drinks a zero sugar coke -Current exercise: she walks around her neighborhood for about 30 to 35 minutes, she does this twice out of the week.  She also has a treadmill and she is going to start using it once a week.  -Educated on A1c and blood sugar goals; Complications of diabetes including kidney damage, retinal damage, and cardiovascular disease; Exercise goal of 150 minutes per week; Prevention and management of hypoglycemic episodes;  -Patient would like a glucose monitor -Counseled to check feet daily and get yearly eye exams -Collaborate with PCP, so patient can receive glucose testing supplies.  -Recommended to continue current medication  Health Maintenance -Vaccine gaps:   - Pneumonia Vaccine - at this time she will let me know when she interested.  -COVID-19 2 nd Booster eligible - at this time she is not interested in being signed up.  -Recommended to continue current medication  Patient Goals/Self-Care Activities . Patient will:  - take medications as prescribed collaborate with provider on medication access solutions  Follow Up Plan: The patient has been provided with contact information for the care management team and has been advised to call with any health related questions or concerns.       Ms. Rallo was given information about Chronic Care Management services today including:  1. CCM service includes personalized support from designated clinical staff supervised by her physician, including individualized plan of care and coordination with other care providers 2. 24/7 contact phone numbers for assistance for urgent and routine care needs. 3. Standard insurance, coinsurance, copays and deductibles apply for chronic care management only during months in which we provide at least 20 minutes of these services. Most insurances  cover these services at 100%, however patients may be responsible for any copay, coinsurance and/or deductible if applicable. This service may help you avoid the need for more expensive face-to-face services. 4. Only one practitioner may furnish and bill the service in a calendar month. 5. The patient may stop CCM services at any time (effective at the end of the month) by phone call to the office staff.  Patient agreed to services and verbal consent obtained.   The patient verbalized understanding of instructions, educational materials, and care plan provided today and agreed to receive a mailed copy of patient instructions, educational materials, and care plan.   [name], PharmD Clinical Pharmacist [facility] [phone]

## 2020-06-13 ENCOUNTER — Encounter: Payer: Self-pay | Admitting: Internal Medicine

## 2020-06-24 ENCOUNTER — Ambulatory Visit (INDEPENDENT_AMBULATORY_CARE_PROVIDER_SITE_OTHER): Payer: Medicare Other | Admitting: Internal Medicine

## 2020-06-24 ENCOUNTER — Encounter: Payer: Self-pay | Admitting: Internal Medicine

## 2020-06-24 ENCOUNTER — Other Ambulatory Visit: Payer: Self-pay

## 2020-06-24 VITALS — BP 124/80 | HR 91 | Temp 98.3°F | Ht 62.4 in | Wt 187.2 lb

## 2020-06-24 DIAGNOSIS — F419 Anxiety disorder, unspecified: Secondary | ICD-10-CM | POA: Diagnosis not present

## 2020-06-24 DIAGNOSIS — E6609 Other obesity due to excess calories: Secondary | ICD-10-CM

## 2020-06-24 DIAGNOSIS — Z6833 Body mass index (BMI) 33.0-33.9, adult: Secondary | ICD-10-CM

## 2020-06-24 DIAGNOSIS — E1165 Type 2 diabetes mellitus with hyperglycemia: Secondary | ICD-10-CM

## 2020-06-24 DIAGNOSIS — R202 Paresthesia of skin: Secondary | ICD-10-CM | POA: Diagnosis not present

## 2020-06-24 DIAGNOSIS — I10 Essential (primary) hypertension: Secondary | ICD-10-CM

## 2020-06-24 DIAGNOSIS — Z23 Encounter for immunization: Secondary | ICD-10-CM | POA: Diagnosis not present

## 2020-06-24 MED ORDER — DULOXETINE HCL 20 MG PO CPEP
20.0000 mg | ORAL_CAPSULE | Freq: Every day | ORAL | 2 refills | Status: DC
Start: 2020-06-24 — End: 2020-07-16

## 2020-06-24 MED ORDER — PREVNAR 20 0.5 ML IM SUSY: 0.5000 mL | PREFILLED_SYRINGE | INTRAMUSCULAR | 0 refills | Status: AC

## 2020-06-24 NOTE — Patient Instructions (Signed)
Diabetes Mellitus Basics  Diabetes mellitus, or diabetes, is a long-term (chronic) disease. It occurs when the body does not properly use sugar (glucose) that is released from food after you eat. Diabetes mellitus may be caused by one or both of these problems:  Your pancreas does not make enough of a hormone called insulin.  Your body does not react in a normal way to the insulin that it makes. Insulin lets glucose enter cells in your body. This gives you energy. If you have diabetes, glucose cannot get into cells. This causes high blood glucose (hyperglycemia). How to treat and manage diabetes You may need to take insulin or other diabetes medicines daily to keep your glucose in balance. If you are prescribed insulin, you will learn how to give yourself insulin by injection. You may need to adjust the amount of insulin you take based on the foods that you eat. You will need to check your blood glucose levels using a glucose monitor as told by your health care provider. The readings can help determine if you have low or high blood glucose. Generally, you should have these blood glucose levels:  Before meals (preprandial): 80-130 mg/dL (4.4-7.2 mmol/L).  After meals (postprandial): below 180 mg/dL (10 mmol/L).  Hemoglobin A1c (HbA1c) level: less than 7%. Your health care provider will set treatment goals for you. Keep all follow-up visits. This is important. Follow these instructions at home: Diabetes medicines Take your diabetes medicines every day as told by your health care provider. List your diabetes medicines here:  Name of medicine: ______________________________ ? Amount (dose): _______________ Time (a.m./p.m.): _______________ Notes: ___________________________________  Name of medicine: ______________________________ ? Amount (dose): _______________ Time (a.m./p.m.): _______________ Notes: ___________________________________  Name of medicine:  ______________________________ ? Amount (dose): _______________ Time (a.m./p.m.): _______________ Notes: ___________________________________ Insulin If you use insulin, list the types of insulin you use here:  Insulin type: ______________________________ ? Amount (dose): _______________ Time (a.m./p.m.): _______________Notes: ___________________________________  Insulin type: ______________________________ ? Amount (dose): _______________ Time (a.m./p.m.): _______________ Notes: ___________________________________  Insulin type: ______________________________ ? Amount (dose): _______________ Time (a.m./p.m.): _______________ Notes: ___________________________________  Insulin type: ______________________________ ? Amount (dose): _______________ Time (a.m./p.m.): _______________ Notes: ___________________________________  Insulin type: ______________________________ ? Amount (dose): _______________ Time (a.m./p.m.): _______________ Notes: ___________________________________ Managing blood glucose Check your blood glucose levels using a glucose monitor as told by your health care provider. Write down the times that you check your glucose levels here:  Time: _______________ Notes: ___________________________________  Time: _______________ Notes: ___________________________________  Time: _______________ Notes: ___________________________________  Time: _______________ Notes: ___________________________________  Time: _______________ Notes: ___________________________________  Time: _______________ Notes: ___________________________________   Low blood glucose Low blood glucose (hypoglycemia) is when glucose is at or below 70 mg/dL (3.9 mmol/L). Symptoms may include:  Feeling: ? Hungry. ? Sweaty and clammy. ? Irritable or easily upset. ? Dizzy. ? Sleepy.  Having: ? A fast heartbeat. ? A headache. ? A change in your vision. ? Numbness around the mouth, lips, or  tongue.  Having trouble with: ? Moving (coordination). ? Sleeping. Treating low blood glucose To treat low blood glucose, eat or drink something containing sugar right away. If you can think clearly and swallow safely, follow the 15:15 rule:  Take 15 grams of a fast-acting carb (carbohydrate), as told by your health care provider.  Some fast-acting carbs are: ? Glucose tablets: take 3-4 tablets. ? Hard candy: eat 3-5 pieces. ? Fruit juice: drink 4 oz (120 mL). ? Regular (not diet) soda: drink 4-6 oz (120-180 mL). ? Honey or sugar:   eat 1 Tbsp (15 mL).  Check your blood glucose levels 15 minutes after you take the carb.  If your glucose is still at or below 70 mg/dL (3.9 mmol/L), take 15 grams of a carb again.  If your glucose does not go above 70 mg/dL (3.9 mmol/L) after 3 tries, get help right away.  After your glucose goes back to normal, eat a meal or a snack within 1 hour. Treating very low blood glucose If your glucose is at or below 54 mg/dL (3 mmol/L), you have very low blood glucose (severe hypoglycemia). This is an emergency. Do not wait to see if the symptoms will go away. Get medical help right away. Call your local emergency services (911 in the [place] Do not drive yourself to the hospital. Questions to ask your health care provider  Should I talk with a diabetes educator?  What equipment will I need to care for myself at home?  What diabetes medicines do I need? When should I take them?  How often do I need to check my blood glucose levels?  What number can I call if I have questions?  When is my follow-up visit?  Where can I find a support group for people with diabetes? Where to find more information  American Diabetes Association: www.diabetes.org  Association of Diabetes Care and Education Specialists: www.diabeteseducator.org Contact a health care provider if:  Your blood glucose is at or above 240 mg/dL (13.3 mmol/L) for 2 days in a row.  You have  been sick or have had a fever for 2 days or more, and you are not getting better.  You have any of these problems for more than 6 hours: ? You cannot eat or drink. ? You feel nauseous. ? You vomit. ? You have diarrhea. Get help right away if:  Your blood glucose is lower than 54 mg/dL (3 mmol/L).  You get confused.  You have trouble thinking clearly.  You have trouble breathing. These symptoms may represent a serious problem that is an emergency. Do not wait to see if the symptoms will go away. Get medical help right away. Call your local emergency services (911 in the [place] Do not drive yourself to the hospital. Summary  Diabetes mellitus is a chronic disease that occurs when the body does not properly use sugar (glucose) that is released from food after you eat.  Take insulin and diabetes medicines as told.  Check your blood glucose every day, as often as told.  Keep all follow-up visits. This is important. This information is not intended to replace advice given to you by your health care provider. Make sure you discuss any questions you have with your health care provider. Document Revised: 05/09/2019 Document Reviewed: 05/09/2019 Elsevier Patient Education  2021 Elsevier Inc.  

## 2020-06-24 NOTE — Progress Notes (Signed)
I,Yamilka Roman [employer] as a Education administrator for [name], MD.,have documented all relevant documentation on the behalf of [name], MD,as directed by  [name], MD while in the presence of [name], MD. This visit occurred during the SARS-CoV-2 public health emergency.  Safety protocols were in place, including screening questions prior to the visit, additional usage of staff PPE, and extensive cleaning of exam room while observing appropriate contact time as indicated for disinfecting solutions.  Subjective:     Patient ID: Whitney Sanchez , female    DOB: 16-Dec-1955 , 65 y.o.   MRN: [id number]   Chief Complaint  Patient presents with  . Diabetes  . Hypertension    HPI  She presents today for diabetes/HTN f/u.  She reports compliance with meds. She denies headaches, chest pain and shortness of breath. Has not had any issues with meds.   Diabetes She presents for her follow-up diabetic visit. She has type 2 diabetes mellitus. Hypoglycemia symptoms include nervousness/anxiousness. Pertinent negatives for diabetes include no blurred vision and no chest pain. There are no hypoglycemic complications. Risk factors for coronary artery disease include diabetes mellitus, dyslipidemia, hypertension, sedentary lifestyle, post-menopausal and obesity. An ACE inhibitor/angiotensin II receptor blocker is being taken.  Hypertension This is a chronic problem. The current episode started more than 1 year ago. The problem has been gradually improving since onset. The problem is uncontrolled. Pertinent negatives include no blurred vision or chest pain. Risk factors for coronary artery disease include dyslipidemia and diabetes mellitus. The current treatment provides moderate improvement.     Past Medical History:  Diagnosis Date  . DM (diabetes mellitus) ([place])   . High cholesterol   . HTN (hypertension)      Family History  Problem Relation Age of Onset  . Diabetes Mother    . Healthy Father   . Breast cancer Neg Hx      Current Outpatient Medications:  .  cyclobenzaprine (FLEXERIL) 5 MG tablet, One tab po qhs prn, Disp: 30 tablet, Rfl: 0 .  DULoxetine (CYMBALTA) 20 MG capsule, Take 1 capsule (20 mg total) by mouth daily., Disp: 30 capsule, Rfl: 2 .  losartan (COZAAR) 100 MG tablet, Take 1 tablet (100 mg total) by mouth daily., Disp: 90 tablet, Rfl: 2 .  Pitavastatin Calcium (LIVALO) 4 MG TABS, Take 1 tablet (4 mg total) by mouth daily., Disp: 90 tablet, Rfl: 1 .  pneumococcal 20-Val Conj Vacc (PREVNAR 20) 0.5 ML SUSY, Inject 0.5 mLs into the muscle tomorrow at 10 am for 1 dose., Disp: 0.5 mL, Rfl: 0 .  Semaglutide, 1 MG/DOSE, (OZEMPIC, 1 MG/DOSE,) 4 MG/3ML SOPN, DIAL AND INJECT UNDER THE SKIN 1 MG WEEKLY, Disp: 6 mL, Rfl: 2   No Known Allergies   Review of Systems  Constitutional: Negative.   Eyes: Negative for blurred vision.  Respiratory: Negative.   Cardiovascular: Negative.  Negative for chest pain.  Gastrointestinal: Negative.   Neurological: Positive for numbness.       She c/o left hand numbness, she is right handed. She feels her sx have worsened over the past month. States sx worst in middle finger. Unable to determine what triggers her sx, they are intermittent. Sometimes worse at night. She is retired from housekeeping. Denies LUE weakness. She denies having difficulty opening doors/jars, etc.   Psychiatric/Behavioral: The patient is nervous/anxious.        She reports experiencing more anxiety. States states her sx appeared to worsen after she retired  in October 2021.  She thinks her sx could be that she has not gotten used to being retired, not having anywhere to go. Denies feeling depressed. Denies suicidal ideation. Adds that she is more irritable and has become impatient.      Today's Vitals   06/24/20 1038  BP: 124/80  Pulse: 91  Temp: 98.3 F (36.8 C)  Weight: 187 lb 3.2 oz (84.9 kg)  Height: 5' 2.4" (1.585 m)  PainSc: 0-No pain    Body mass index is 33.8 kg/m.  Wt Readings from Last 3 Encounters:  06/24/20 187 lb 3.2 oz (84.9 kg)  02/23/20 186 lb 9.6 oz (84.6 kg)  10/18/19 187 lb 12.8 oz (85.2 kg)    Objective:  Physical Exam Vitals and nursing note reviewed.  Constitutional:      Appearance: Normal appearance.  HENT:     Head: Normocephalic and atraumatic.     Nose:     Comments: Masked     Mouth/Throat:     Comments: Masked  Cardiovascular:     Rate and Rhythm: Normal rate and regular rhythm.     Heart sounds: Normal heart sounds.  Pulmonary:     Effort: Pulmonary effort is normal.     Breath sounds: Normal breath sounds.  Musculoskeletal:     Comments: Positive Tinel's sign b/l, worse on left Neg Phalen's b/l  Skin:    General: Skin is warm.  Neurological:     General: No focal deficit present.     Mental Status: She is alert.  Psychiatric:        Mood and Affect: Mood normal.        Behavior: Behavior normal.         Assessment And Plan:     1. Uncontrolled type 2 diabetes mellitus with hyperglycemia (HCC) Comments: Chronic, this has been controlled. Last a1c 5.9. No change in meds.  - BMP8+EGFR - Hemoglobin A1c  2. Essential hypertension, benign Comments: Chronic, well controlled. Advised to follow low sodium diet. Encouraged to walk 30 minutes at least five days per week.   3. Paresthesias in left hand Comments: I will refer her to Neuro, needs EMG/NCS to r/o CTS. She agrees with treatment plan. I will also give her rx wrist splint.  - Ambulatory referral to Neurology  4. Anxiety Comments: GAD 7 scale performed. I will start duloxetine since this may also help with left hand pain/paresthesias. Possible side effects d/w pt.   5. Class 1 obesity due to excess calories with serious comorbidity and body mass index (BMI) of 33.0 to 33.9 in adult Comments: She is encouraged to aim for at least 150 minutes of exercise per week. Advised to strive to lose ten pounds over next several  months.   6. Immunization due Comments: I will send rx [id number] to her local pharmacy.     Patient was given opportunity to ask questions. Patient verbalized understanding of the plan and was able to repeat key elements of the plan. All questions were answered to their satisfaction.   I, [name], MD, have reviewed all documentation for this visit. The documentation on 06/24/20 for the exam, diagnosis, procedures, and orders are all accurate and complete.   IF YOU HAVE BEEN REFERRED TO A SPECIALIST, IT MAY TAKE 1-2 WEEKS TO SCHEDULE/PROCESS THE REFERRAL. IF YOU HAVE NOT HEARD FROM US/SPECIALIST IN TWO WEEKS, PLEASE GIVE [place] A CALL AT [phone].   THE PATIENT IS ENCOURAGED TO PRACTICE SOCIAL DISTANCING DUE  TO THE COVID-19 PANDEMIC.

## 2020-06-25 LAB — BMP8+EGFR
BUN/Creatinine Ratio: 18 (ref 12–28)
BUN: 15 mg/dL (ref 8–27)
CO2: 22 mmol/L (ref 20–29)
Calcium: 9.7 mg/dL (ref 8.7–10.3)
Chloride: 103 mmol/L (ref 96–106)
Creatinine, Ser: 0.84 mg/dL (ref 0.57–1.00)
Glucose: 86 mg/dL (ref 65–99)
Potassium: 4.2 mmol/L (ref 3.5–5.2)
Sodium: 140 mmol/L (ref 134–144)
eGFR: 76 mL/min/{1.73_m2} (ref >59)

## 2020-06-25 LAB — HEMOGLOBIN A1C
Est. average glucose Bld gHb Est-mCnc: 123 mg/dL
Hgb A1c MFr Bld: 5.9 % — ABNORMAL HIGH (ref 4.8–5.6)

## 2020-06-28 ENCOUNTER — Telehealth: Payer: Self-pay

## 2020-06-28 NOTE — Progress Notes (Signed)
Called patient to inform her that her Ozempic is at the office and she can come by Monday to get it. Patient voiced understanding.  [name] [facility] Clinical Pharmacist Assistant [phone]

## 2020-07-03 ENCOUNTER — Other Ambulatory Visit: Payer: Self-pay

## 2020-07-03 ENCOUNTER — Ambulatory Visit
Admission: RE | Admit: 2020-07-03 | Discharge: 2020-07-03 | Disposition: A | Discharge status: Home / Self Care | Payer: Medicare Other | Source: Ambulatory Visit | Attending: Nurse Practitioner | Admitting: Nurse Practitioner

## 2020-07-03 DIAGNOSIS — Z1231 Encounter for screening mammogram for malignant neoplasm of breast: Secondary | ICD-10-CM

## 2020-07-16 ENCOUNTER — Other Ambulatory Visit: Payer: Self-pay | Admitting: Internal Medicine

## 2020-08-12 ENCOUNTER — Telehealth: Payer: Self-pay

## 2020-08-12 NOTE — Chronic Care Management (AMB) (Addendum)
Chronic Care Management Pharmacy Assistant   Name: Whitney Sanchez  MRN: [id number] DOB: 19-Sep-1955   Reason for Encounter: Disease State/ Diabetes   Recent office visits:  06-24-2020 [name], MD. START Cymbalta 20 mg daily. Prevnar given. Referral to neurology.  Recent consult visits:  None  Hospital visits:  None in previous 6 months  Medications: Outpatient Encounter Medications as of 08/12/2020  Medication Sig   cyclobenzaprine (FLEXERIL) 5 MG tablet One tab po qhs prn   DULoxetine (CYMBALTA) 20 MG capsule TAKE 1 CAPSULE BY MOUTH EVERY DAY   losartan (COZAAR) 100 MG tablet Take 1 tablet (100 mg total) by mouth daily.   Pitavastatin Calcium (LIVALO) 4 MG TABS Take 1 tablet (4 mg total) by mouth daily.   Semaglutide, 1 MG/DOSE, (OZEMPIC, 1 MG/DOSE,) 4 MG/3ML SOPN DIAL AND INJECT UNDER THE SKIN 1 MG WEEKLY   No facility-administered encounter medications on file as of 08/12/2020.   Recent Relevant Labs: Lab Results  Component Value Date/Time   HGBA1C 5.9 (H) 06/24/2020 12:03 PM   HGBA1C 5.9 (H) 02/23/2020 11:33 AM   HGBA1C 6.4 08/16/2017 12:00 AM   MICROALBUR 10 10/10/2018 04:56 PM    Kidney Function Lab Results  Component Value Date/Time   CREATININE 0.84 06/24/2020 12:03 PM   CREATININE 0.87 02/23/2020 11:33 AM   GFRNONAA 70 02/23/2020 11:33 AM   GFRAA 80 02/23/2020 11:33 AM    Current antihyperglycemic regimen:  Ozempic 1 mg weekly  What recent interventions/DTPs have been made to improve glycemic control:  Educated on A1c and blood sugar goals Complications of diabetes including kidney damage, retinal damage, and cardiovascular disease Exercise goal of 150 minutes per week Prevention and management of hypoglycemic episodes  Have there been any recent hospitalizations or ED visits since last visit with CPP? No  Patient denies hypoglycemic symptoms  Patient denies hyperglycemic symptoms  How often are you checking your blood sugar? Patient  states she isn't checking blood sugars  What are your blood sugars ranging?  Fasting: none Before meals: None After meals: None Bedtime: None  During the week, how often does your blood glucose drop below 70? Patient states not to her knowledge since she doesn't check blood sugars.  Are you checking your feet daily/regularly? Patient stated daily  Adherence Review: Is the patient currently on a STATIN medication? Yes Is the patient currently on ACE/ARB medication? Yes Does the patient have >5 day gap between last estimated fill dates? No  08-13-2020: Called to reschedule today's telephone appointment and complete assessment per [name] CPP.  08-12-2020: Patient aware of telephone appointment with [name] CPP on 08-13-2020 at 2:00. Patient aware to have/bring all medications, supplements, blood pressure and/or blood sugar logs to visit.  Questions: Have you had any recent office visit or specialist visit outside of [facility]? Patient stated no  Are there any concerns you would like to discuss during your office visit? Patient stated no  Are you having any problems obtaining your medications? (Whether it pharmacy issues or cost) Patient stated no   If patient has any PAP medications ask if they are having any problems getting their PAP medication or refill? Patient stated no  NOTES: Patient stated she is down to her last box of Ozempic injection. Contacted [employer] Patient assistance and was informed next shipment will be shipped out around 09-02-2020. Informed patient.  Care Gaps: Shingrix overdue Covid booster overdue Last medicare wellness 02-23-2020  Star Rating Drug: Ozempic 1 mg- Patient  assistance Losartan 100 mg- Last filled 05-27-2020 90 DS [employer] [facility] Livalo 4 mg- Patient assistance  Any gaps in medications fill history? No   [name] [facility] Clinical Pharmacist Assistant [phone]

## 2020-08-13 ENCOUNTER — Telehealth: Payer: Self-pay

## 2020-09-18 ENCOUNTER — Ambulatory Visit: Payer: Medicare Other | Admitting: Neurology

## 2020-09-18 ENCOUNTER — Encounter: Payer: Self-pay | Admitting: Neurology

## 2020-09-18 ENCOUNTER — Other Ambulatory Visit: Payer: Self-pay

## 2020-09-18 VITALS — BP 171/97 | HR 80 | Ht 64.0 in | Wt 184.0 lb

## 2020-09-18 DIAGNOSIS — R202 Paresthesia of skin: Secondary | ICD-10-CM | POA: Diagnosis not present

## 2020-09-18 DIAGNOSIS — R200 Anesthesia of skin: Secondary | ICD-10-CM | POA: Insufficient documentation
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
## 2020-09-18 NOTE — Patient Instructions (Signed)
  Carpal Tunnel Syndrome  Carpal tunnel syndrome is a condition that causes pain, numbness, and weakness in your hand and fingers. The carpal tunnel is a narrow area located on the palm side of your wrist. Repeated wrist motion or certain diseases may cause swelling within the tunnel. This swelling pinches the main nerve in the wrist.The main nerve in the wrist is called the median nerve. What are the causes? This condition may be caused by: Repeated and forceful wrist and hand motions. Wrist injuries. Arthritis. A cyst or tumor in the carpal tunnel. Fluid buildup during pregnancy. Use of tools that vibrate. Sometimes the cause of this condition is not known. What increases the risk? The following factors may make you more likely to develop this condition: Having a job that requires you to repeatedly or forcefully move your wrist or hand or requires you to use tools that vibrate. This may include jobs that involve using computers, working on an assembly line, or working with power tools such as drills or sanders. Being a woman. Having certain conditions, such as: Diabetes. Obesity. An underactive thyroid (hypothyroidism). Kidney failure. Rheumatoid arthritis. What are the signs or symptoms? Symptoms of this condition include: A tingling feeling in your fingers, especially in your thumb, index, and middle fingers. Tingling or numbness in your hand. An aching feeling in your entire arm, especially when your wrist and elbow are bent for a long time. Wrist pain that goes up your arm to your shoulder. Pain that goes down into your palm or fingers. A weak feeling in your hands. You may have trouble grabbing and holding items. Your symptoms may feel worse during the night. How is this diagnosed? This condition is diagnosed with a medical history and physical exam. You may also have tests, including: Electromyogram (EMG). This test measures electrical signals sent by your nerves into the  muscles. Nerve conduction study. This test measures how well electrical signals pass through your nerves. Imaging tests, such as X-rays, ultrasound, and MRI. These tests check for possible causes of your condition. How is this treated? This condition may be treated with: Lifestyle changes. It is important to stop or change the activity that caused your condition. Doing exercise and activities to strengthen and stretch your muscles and tendons (physical therapy). Making lifestyle changes to help with your condition and learning how to do your daily activities safely (occupational therapy). Medicines for pain and inflammation. This may include medicine that is injected into your wrist. A wrist splint or brace. Surgery. Follow these instructions at home: If you have a splint or brace: Wear the splint or brace as told by your health care provider. Remove it only as told by your health care provider. Loosen the splint or brace if your fingers tingle, become numb, or turn cold and blue. Keep the splint or brace clean. If the splint or brace is not waterproof: Do not let it get wet. Cover it with a watertight covering when you take a bath or shower. Managing pain, stiffness, and swelling If directed, put ice on the painful area. To do this: If you have a removeable splint or brace, remove it as told by your health care provider. Put ice in a plastic bag. Place a towel between your skin and the bag or between the splint or brace and the bag. Leave the ice on for 20 minutes, 2-3 times a day. Do not fall asleep with the cold pack on your skin. Remove the ice if your skin   turns bright red. This is very important. If you cannot feel pain, heat, or cold, you have a greater risk of damage to the area. Move your fingers often to reduce stiffness and swelling. General instructions Take over-the-counter and prescription medicines only as told by your health care provider. Rest your wrist and hand from  any activity that may be causing your pain. If your condition is work related, talk with your employer about changes that can be made, such as getting a wrist pad to use while typing. Do any exercises as told by your health care provider, physical therapist, or occupational therapist. Keep all follow-up visits. This is important. Contact a health care provider if: You have new symptoms. Your pain is not controlled with medicines. Your symptoms get worse. Get help right away if: You have severe numbness or tingling in your wrist or hand. Summary Carpal tunnel syndrome is a condition that causes pain, numbness, and weakness in your hand and fingers. It is usually caused by repeated wrist motions. Lifestyle changes and medicines are used to treat carpal tunnel syndrome. Surgery may be recommended. Follow your health care provider's instructions about wearing a splint, resting from activity, keeping follow-up visits, and calling for help. This information is not intended to replace advice given to you by your health care provider. Make sure you discuss any questions you have with your healthcare provider. Document Revised: 05/18/2019 Document Reviewed: 05/18/2019 Elsevier Patient Education  2022 Elsevier Inc.  Electromyoneurogram Electromyoneurogram is a test to check how well your muscles and nerves are working. This procedure includes the combined use of electromyogram (EMG) and nerve conduction study (NCS). EMG is used to look for muscular disorders. NCS, which is also called electroneurogram, measures how well your nerves arecontrolling your muscles. The procedures are usually done together to check if your muscles and nerves are healthy. If the results of the tests are abnormal, this may indicatedisease or injury, such as a neuromuscular disease or peripheral nerve damage. Tell a health care provider about: Any allergies you have. All medicines you are taking, including vitamins, herbs, eye  drops, creams, and over-the-counter medicines. Any problems you or family members have had with anesthetic medicines. Any blood disorders you have. Any surgeries you have had. Any medical conditions you have. If you have a pacemaker. Whether you are pregnant or may be pregnant. What are the risks? Generally, this is a safe procedure. However, problems may occur, including: Infection where the electrodes were inserted. Bleeding. What happens before the procedure? Medicines Ask your health care provider about: Changing or stopping your regular medicines. This is especially important if you are taking diabetes medicines or blood thinners. Taking medicines such as aspirin and ibuprofen. These medicines can thin your blood. Do not take these medicines unless your health care provider tells you to take them. Taking over-the-counter medicines, vitamins, herbs, and supplements. General instructions Your health care provider may ask you to avoid: Beverages that have caffeine, such as coffee and tea. Any products that contain nicotine or tobacco. These products include cigarettes, e-cigarettes, and chewing tobacco. If you need help quitting, ask your health care provider. Do not use lotions or creams on the same day that you will be having the procedure. What happens during the procedure? For EMG  Your health care provider will ask you to stay in a position so that he or she can access the muscle that will be studied. You may be standing, sitting, or lying down. You may be given a   medicine that numbs the area (local anesthetic). A very thin needle that has an electrode will be inserted into your muscle. Another small electrode will be placed on your skin near the muscle. Your health care provider will ask you to continue to remain still. The electrodes will send a signal that tells about the electrical activity of your muscles. You may see this on a monitor or hear it in the room. After your  muscles have been studied at rest, your health care provider will ask you to contract or flex your muscles. The electrodes will send a signal that tells about the electrical activity of your muscles. Your health care provider will remove the electrodes and the electrode needles when the procedure is finished. The procedure may vary among health care providers and hospitals. For NCS  An electrode that records your nerve activity (recording electrode) will be placed on your skin by the muscle that is being studied. An electrode that is used as a reference (reference electrode) will be placed near the recording electrode. A paste or gel will be applied to your skin between the recording electrode and the reference electrode. Your nerve will be stimulated with a mild shock. Your health care provider will measure how much time it takes for your muscle to react. Your health care provider will remove the electrodes and the gel when the procedure is finished. The procedure may vary among health care providers and hospitals. What happens after the procedure? It is up to you to get the results of your procedure. Ask your health care provider, or the department that is doing the procedure, when your results will be ready. Your health care provider may: Give you medicines for any pain. Monitor the insertion sites to make sure that bleeding stops. Summary Electromyoneurogram is a test to check how well your muscles and nerves are working. If the results of the tests are abnormal, this may indicate disease or injury. This is a safe procedure. However, problems may occur, such as bleeding and infection. Your health care provider will do two tests to complete this procedure. One checks your muscles (EMG) and another checks your nerves (NCS). It is up to you to get the results of your procedure. Ask your health care provider, or the department that is doing the procedure, when your results will be ready. This  information is not intended to replace advice given to you by your health care provider. Make sure you discuss any questions you have with your healthcare provider. Document Revised: 09/21/2017 Document Reviewed: 09/03/2017 Elsevier Patient Education  2022 Elsevier Inc.  

## 2020-09-24 ENCOUNTER — Telehealth: Payer: Self-pay

## 2020-09-24 NOTE — Chronic Care Management (AMB) (Addendum)
  Whitney Sanchez was reminded to have all medications, supplements and any blood glucose and blood pressure readings available for review with [name], Pharm. D, at her telephone visit on 09-25-2020 at 2:00.   Questions: Have you had any recent office visit or specialist visit outside of [facility]? Patient stated no  Are there any concerns you would like to discuss during your office visit? Patient stated no  Are you having any problems obtaining your medications? (Whether it pharmacy issues or cost) Patient stated no  If patient has any PAP medications ask if they are having any problems getting their PAP medication or refill? Patient stated she is in need of Ozempic. Called [facility] and was informed that they're experiencing shipping delays but patient should have received a 4 month supply in June that would've lasted until the end of September. Patient's next shipment has been processing since 09-02-2020. Patient states she doesn't have any. Told patient she may have been using pens wrong because 4 pens were shipped out with one pen for each month so shouldn't be out and she stated she probably has been. [name] PTM stated she can come by the office for a sample. Patient informed about sample at the office.  Care Gaps: Shingrix overdue Covid booster overdue Last medicare wellness 02-23-2020  Star Rating Drug: Ozempic 1 mg- Patient assistance Losartan 100 mg- Last filled 05-27-2020 90 DS [facility] (Patient stated she recently received medication and has a bottle that hasn't been opened) Livalo 4 mg- Patient assistance  Any gaps in medications fill history? No   [name] [name] Clinical Pharmacist Assistant [phone]

## 2020-09-25 ENCOUNTER — Ambulatory Visit (INDEPENDENT_AMBULATORY_CARE_PROVIDER_SITE_OTHER): Payer: Medicare Other

## 2020-09-25 DIAGNOSIS — E1165 Type 2 diabetes mellitus with hyperglycemia: Secondary | ICD-10-CM

## 2020-09-25 DIAGNOSIS — E7800 Pure hypercholesterolemia, unspecified: Secondary | ICD-10-CM

## 2020-09-25 DIAGNOSIS — I10 Essential (primary) hypertension: Secondary | ICD-10-CM

## 2020-09-25 NOTE — Progress Notes (Signed)
Chronic Care Management Pharmacy Note  10/09/2020 Name:  Whitney Sanchez MRN:  [id number] DOB:  1955-10-08  Summary: Patient reports working and doing well with her medications   Recommendations/Changes made from today's visit: Recommend patient receive shingrix vaccine, and COVID-19 booster   Plan: Patient is going to receive her shingles vaccine and COVID-19 booster.    Subjective: Whitney Sanchez is an 65 y.o. year old female who is a primary patient of Glendale Chard, MD.  The CCM team was consulted for assistance with disease management and care coordination needs.    Engaged with patient by telephone for follow up visit in response to provider referral for pharmacy case management and/or care coordination services. Patient is sitting with someone from 7am - 2 pm Monday through Friday.   Consent to Services:  The patient was given information about Chronic Care Management services, agreed to services, and gave verbal consent prior to initiation of services.  Please see initial visit note for detailed documentation.   Patient Care Team: Glendale Chard, MD as PCP - General (Internal Medicine) Mayford Knife, [facility] (Pharmacist)  Recent office visits: 06/24/2020 PCP OV  Recent consult visits: 09/18/2020 [facility]rology [facility] visits: None in previous 6 months   Objective:  Lab Results  Component Value Date   CREATININE 0.84 06/24/2020   BUN 15 06/24/2020   GFRNONAA 70 02/23/2020   GFRAA 80 02/23/2020   NA 140 06/24/2020   K 4.2 06/24/2020   CALCIUM 9.7 06/24/2020   CO2 22 06/24/2020   GLUCOSE 86 06/24/2020    Lab Results  Component Value Date/Time   HGBA1C 5.9 (H) 06/24/2020 12:03 PM   HGBA1C 5.9 (H) 02/23/2020 11:33 AM   HGBA1C 6.4 08/16/2017 12:00 AM   MICROALBUR 10 10/10/2018 04:56 PM    Last diabetic Eye exam:  Lab Results  Component Value Date/Time   HMDIABEYEEXA No Retinopathy 05/29/2020 12:00 AM    Last diabetic Foot exam: No results  found for: HMDIABFOOTEX   Lab Results  Component Value Date   CHOL 150 02/23/2020   HDL 53 02/23/2020   LDLCALC 60 02/23/2020   TRIG 229 (H) 02/23/2020   CHOLHDL 2.8 02/23/2020    Hepatic Function Latest Ref Rng & Units 02/23/2020 09/27/2019 06/15/2019  Total Protein 6.0 - 8.5 g/dL 7.3 7.4 6.6  Albumin 3.8 - 4.8 g/dL 4.7 4.8 4.2  AST 0 - 40 IU/L '20 21 18  ' ALT 0 - 32 IU/L '22 21 14  ' Alk Phosphatase 44 - 121 IU/L 83 80 76  Total Bilirubin 0.0 - 1.2 mg/dL 0.3 0.4 0.3    No results found for: TSH, FREET4  CBC Latest Ref Rng & Units 09/27/2019 10/10/2018  WBC 3.4 - 10.8 x10E3/uL 5.9 8.2  Hemoglobin 11.1 - 15.9 g/dL 12.1 11.1  Hematocrit 34.0 - 46.6 % 36.9 34.6  Platelets 150 - 450 x10E3/uL 276 284    Lab Results  Component Value Date/Time   VD25OH 36.9 11/10/2016 12:00 AM    Clinical ASCVD: No  The 10-year ASCVD risk score (Arnett DK, et al., 2019) is: 31.6%   Values used to calculate the score:     Age: 15 years     Sex: Female     Is Non-Hispanic African American: Yes     Diabetic: Yes     Tobacco smoker: No     Systolic Blood Pressure: 086 mmHg     Is BP treated: Yes     HDL Cholesterol: 53  mg/dL     Total Cholesterol: 150 mg/dL    Depression screen [facility] 2/9 10/18/2019 10/10/2018 06/20/2018  Decreased Interest 0 0 0  Down, Depressed, Hopeless 0 0 0  PHQ - 2 Score 0 0 0     Social History   Tobacco Use  Smoking Status Never  Smokeless Tobacco Never   BP Readings from Last 3 Encounters:  09/18/20 (!) 171/97  06/24/20 124/80  02/23/20 124/70   Pulse Readings from Last 3 Encounters:  09/18/20 80  06/24/20 91  02/23/20 (!) 101   Wt Readings from Last 3 Encounters:  09/18/20 184 lb (83.5 kg)  06/24/20 187 lb 3.2 oz (84.9 kg)  02/23/20 186 lb 9.6 oz (84.6 kg)   BMI Readings from Last 3 Encounters:  09/18/20 31.58 kg/m  06/24/20 33.80 kg/m  02/23/20 34.57 kg/m    Assessment/Interventions: Review of patient past medical history, allergies, medications, health  status, including review of consultants reports, laboratory and other test data, was performed as part of comprehensive evaluation and provision of chronic care management services.   SDOH:  (Social Determinants of Health) assessments and interventions performed: Yes SDOH Interventions    Flowsheet Row Most Recent Value  SDOH Interventions   Financial Strain Interventions --  [Patient assistance application]      SDOH Screenings   Alcohol Screen: Not on file  Depression (PHQ2-9): Low Risk    PHQ-2 Score: 0  Financial Resource Strain: High Risk   Difficulty of Paying Living Expenses: Very hard  Food Insecurity: No Food Insecurity   Worried About Charity fundraiser in the Last Year: Never true   Ran Out of Food in the Last Year: Never true  Housing: Low Risk    Last Housing Risk Score: 0  Physical Activity: Insufficiently Active   Days of Exercise per Week: 2 days   Minutes of Exercise per Session: 20 min  Social Connections: Moderately Integrated   Frequency of Communication with Friends and Family: More than three times a week   Frequency of Social Gatherings with Friends and Family: Once a week   Attends Religious Services: More than 4 times per year   Active Member of [employer] or Organizations: No   Attends Music therapist: Never   Marital Status: Married  Stress: No Stress Concern Present   Feeling of Stress : Not at all  Tobacco Use: Low Risk    Smoking Tobacco Use: Never   Smokeless Tobacco Use: Never  Transportation Needs: Unknown   Lack of Transportation (Medical): No   Lack of Transportation (Non-Medical): Not on file    CCM Care Plan  No Known Allergies  Medications Reviewed Today     Reviewed by Melvenia Beam, MD (Physician) on 09/18/20 at 1014  Med List Status: <None>   Medication Order Taking? Sig Documenting Provider Last Dose Status Informant  cyclobenzaprine (FLEXERIL) 5 MG tablet [id number] Yes One tab po qhs prn Glendale Chard, MD  Taking Active   DULoxetine (CYMBALTA) 20 MG capsule [id number] Yes TAKE 1 CAPSULE BY MOUTH EVERY DAY Glendale Chard, MD Taking Active   losartan (COZAAR) 100 MG tablet [id number] Yes Take 1 tablet (100 mg total) by mouth daily. Glendale Chard, MD Taking Active   Pitavastatin Calcium (LIVALO) 4 MG TABS [id number] Yes Take 1 tablet (4 mg total) by mouth daily. Glendale Chard, MD Taking Active   Semaglutide, 1 MG/DOSE, (OZEMPIC, 1 MG/DOSE,) 4 MG/3ML SOPN [id number] Yes DIAL AND INJECT UNDER THE SKIN 1 MG WEEKLY  Glendale Chard, MD Taking Active             Patient Active Problem List   Diagnosis Date Noted   Numbness and tingling of hand 09/18/2020   Uncontrolled type 2 diabetes mellitus with hyperglycemia ([place]) 03/17/2018   Essential hypertension, benign 03/17/2018   Pure hypercholesterolemia 03/17/2018    Immunization History  Administered Date(s) Administered   Fluad Quad(high Dose 65+) 10/18/2019   Influenza-Unspecified 10/19/2017   Moderna SARS-COV2 Booster Vaccination 11/17/2019   Moderna Sars-Covid-2 Vaccination 01/21/2019, 02/18/2019   Pneumococcal Polysaccharide-23 02/05/2015    Conditions to be addressed/monitored:  Hypertension, Hyperlipidemia, and Diabetes  Care Plan : [place]  Updates made by Mayford Knife, [place] since 10/09/2020 12:00 AM     Problem: HTN, HLD, DMI II   Priority: High     Goal: Disease Management   Recent Progress: On track  Priority: High  Note:   Current Barriers:  Unable to independently monitor therapeutic efficacy  Pharmacist Clinical Goal(s):  Patient will achieve adherence to monitoring guidelines and medication adherence to achieve therapeutic efficacy through collaboration with PharmD and provider.   Interventions: 1:1 collaboration with Glendale Chard, MD regarding development and update of comprehensive plan of care as evidenced by provider attestation and co-signature Inter-disciplinary care team collaboration (see  longitudinal plan of care) Comprehensive medication review performed; medication list updated in electronic medical record  Hypertension (BP goal <130/80) -Controlled -Current treatment: Losartan 100 mg tablet once per day  -Current home readings: Patient reports that she is not currently checking her BP at home  -Current dietary habits: using salt sparingly  -Current exercise habits: please see hyperlipidemia  -Denies hypotensive/hypertensive symptoms -Educated on BP goals and benefits of medications for prevention of heart attack, stroke and kidney damage; Daily salt intake goal < 2300 mg; Importance of home blood pressure monitoring; Proper BP monitoring technique; -Counseled to monitor BP at home at least once per week, document, and provide log at future appointments -She is going to increase the amount of exercise she is doing weekly.  -Recommended to continue current medication  Hyperlipidemia: (LDL goal < 70) -Controlled -Current treatment: Pitavastatin 4 mg tablet once per day  -Current dietary patterns: she is avoiding fried and fatty foods.  -Current exercise habits: please see diabetes -Educated on Cholesterol goals;  Benefits of statin for ASCVD risk reduction; Strategies to manage statin-induced myalgias; -Recommended to continue current medication  Diabetes (A1c goal <7%) -Controlled -Current medications: Ozempic 1 mg - inject once a week on friday -Current home glucose readings fasting glucose:patient does not check her blood sugar at home  -Denies hypoglycemic/hyperglycemic symptoms -Current meal patterns: patient reports eating regular meals , she is eating plenty of vegetables and salads including collard greens and squash snacks: rarely snacking drinks: water at least 3-4 glasses of water per day  -Current exercise: she is walking after work about three times per week, for 30 minutes.  -Educated on Exercise goal of 150 minutes per week; Prevention and  management of hypoglycemic episodes; -Counseled to check feet daily and get yearly eye exams -Recommended to continue current medication  Health Maintenance -Vaccine gaps: COVID-19 Booster, Shingrix, Influenza Vaccine  Patient Goals/Self-Care Activities Patient will:  - take medications as prescribed  Follow Up Plan: The patient has been provided with contact information for the care management team and has been advised to call with any health related questions or concerns.        Medication Assistance:  Ozempic obtained through  [employer] medication assistance program.  Enrollment ends 12/2020.   Compliance/Adherence/Medication fill history: Care Gaps: Shingrix Vaccine  COVID-19 Vaccine Influenza Vaccine  Star-Rating Drugs: Pitavastatin 4 mg tablet Ozempic 1 mg tablet Losartan 100 mg tablet  Patient's preferred pharmacy is:  [id number] [place], [id number]: [phone]: [phone] Uses pill box? Yes Pt endorses 85% compliance  We discussed: Current pharmacy is preferred with insurance plan and patient is satisfied with pharmacy services Patient decided to: Continue current medication management strategy  Care Plan and Follow Up Patient Decision:  Patient agrees to Care Plan and Follow-up.  Plan: The patient has been provided with contact information for the care management team and has been advised to call with any health related questions or concerns.   VOrlando Penner PharmD Clinical Pharmacist [facility] [phone]

## 2020-09-27 ENCOUNTER — Telehealth: Payer: Self-pay

## 2020-09-27 NOTE — Chronic Care Management (AMB) (Signed)
09/27/2020- Patient aware Ozempic from [employer] patient assistance program arrived PCP office 09/26/2020 and she may pick up on Monday.   [name], CMA Clinical Pharmacist Assistant [phone]

## 2020-10-03 ENCOUNTER — Encounter: Payer: Medicare Other | Admitting: Neurology

## 2020-10-08 ENCOUNTER — Telehealth: Payer: Self-pay

## 2020-10-08 NOTE — Chronic Care Management (AMB) (Signed)
Called [employer] to confirm if patient assistance application was received for Livalo and was informed application wasn't on file. Whitney Sanchez stated there is a PAP on file waiting for her signature. Patient stated she will come by the office today around 3 to sign and pick up Ozempic.  [name] [facility] Clinical Pharmacist Assistant [phone]

## 2020-10-08 NOTE — Patient Instructions (Signed)
Visit Information It was great speaking with you today!  Please let me know if you have any questions about our visit.   Goals Addressed             This Visit's Progress    Manage My Medicine       Timeframe:  Long-Range Goal Priority:  High Start Date:                             Expected End Date:                       Follow Up Date 12/26/2020   - call for medicine refill 2 or 3 days before it runs out - call if I am sick and can't take my medicine - keep a list of all the medicines I take; vitamins and herbals too - learn to read medicine labels - use a pillbox to sort medicine    Why is this important?   These steps will help you keep on track with your medicines.   Notes:  -Please make sure to check your mail for follow up from patient assistance for Livalo and Ozempic.         Patient Care Plan: CCM Pharmacy Care Plan     Problem Identified: HTN, HLD, DMI II   Priority: High     Goal: Disease Management   Recent Progress: On track  Priority: High  Note:   Current Barriers:  Unable to independently monitor therapeutic efficacy  Pharmacist Clinical Goal(s):  Patient will achieve adherence to monitoring guidelines and medication adherence to achieve therapeutic efficacy through collaboration with PharmD and provider.   Interventions: 1:1 collaboration with [name], MD regarding development and update of comprehensive plan of care as evidenced by provider attestation and co-signature Inter-disciplinary care team collaboration (see longitudinal plan of care) Comprehensive medication review performed; medication list updated in electronic medical record  Hypertension (BP goal <130/80) -Controlled -Current treatment: Losartan 100 mg tablet once per day  -Current home readings: Patient reports that she is not currently checking her BP at home  -Current dietary habits: using salt sparingly  -Current exercise habits: please see hyperlipidemia   -Denies hypotensive/hypertensive symptoms -Educated on BP goals and benefits of medications for prevention of heart attack, stroke and kidney damage; Daily salt intake goal < 2300 mg; Importance of home blood pressure monitoring; Proper BP monitoring technique; -Counseled to monitor BP at home at least once per week, document, and provide log at future appointments -She is going to increase the amount of exercise she is doing weekly.  -Recommended to continue current medication  Hyperlipidemia: (LDL goal < 70) -Controlled -Current treatment: Pitavastatin 4 mg tablet once per day  -Current dietary patterns: she is avoiding fried and fatty foods.  -Current exercise habits: please see diabetes -Educated on Cholesterol goals;  Benefits of statin for ASCVD risk reduction; Strategies to manage statin-induced myalgias; -Recommended to continue current medication  Diabetes (A1c goal <7%) -Controlled -Current medications: Ozempic 1 mg - inject once a week on friday -Current home glucose readings fasting glucose:patient does not check her blood sugar at home  -Denies hypoglycemic/hyperglycemic symptoms -Current meal patterns: patient reports eating regular meals , she is eating plenty of vegetables and salads including collard greens and squash snacks: rarely snacking drinks: water at least 3-4 glasses of water per day  -Current exercise: she is walking after work about three times  per week, for 30 minutes.  -Educated on Exercise goal of 150 minutes per week; Prevention and management of hypoglycemic episodes; -Counseled to check feet daily and get yearly eye exams -Recommended to continue current medication  Health Maintenance -Vaccine gaps: COVID-19 Booster, Shingrix, Influenza Vaccine  Patient Goals/Self-Care Activities Patient will:  - take medications as prescribed  Follow Up Plan: The patient has been provided with contact information for the care management team and has been  advised to call with any health related questions or concerns.       Patient agreed to services and verbal consent obtained.   The patient verbalized understanding of instructions, educational materials, and care plan provided today and agreed to receive a mailed copy of patient instructions, educational materials, and care plan.   [name], PharmD Clinical Pharmacist [facility] [phone]

## 2020-10-18 DIAGNOSIS — I10 Essential (primary) hypertension: Secondary | ICD-10-CM | POA: Diagnosis not present

## 2020-10-18 DIAGNOSIS — E1165 Type 2 diabetes mellitus with hyperglycemia: Secondary | ICD-10-CM

## 2020-10-18 DIAGNOSIS — E7800 Pure hypercholesterolemia, unspecified: Secondary | ICD-10-CM

## 2020-10-31 ENCOUNTER — Ambulatory Visit (INDEPENDENT_AMBULATORY_CARE_PROVIDER_SITE_OTHER): Payer: Medicare Other | Admitting: Internal Medicine

## 2020-10-31 ENCOUNTER — Telehealth: Payer: Self-pay

## 2020-10-31 ENCOUNTER — Other Ambulatory Visit: Payer: Self-pay

## 2020-10-31 VITALS — BP 138/80 | HR 80 | Temp 98.1°F | Ht 62.4 in | Wt 189.0 lb

## 2020-10-31 DIAGNOSIS — Z79899 Other long term (current) drug therapy: Secondary | ICD-10-CM | POA: Diagnosis not present

## 2020-10-31 DIAGNOSIS — Z2821 Immunization not carried out because of patient refusal: Secondary | ICD-10-CM | POA: Diagnosis not present

## 2020-10-31 DIAGNOSIS — M2550 Pain in unspecified joint: Secondary | ICD-10-CM | POA: Diagnosis not present

## 2020-10-31 DIAGNOSIS — E1165 Type 2 diabetes mellitus with hyperglycemia: Secondary | ICD-10-CM | POA: Diagnosis not present

## 2020-10-31 DIAGNOSIS — E7800 Pure hypercholesterolemia, unspecified: Secondary | ICD-10-CM

## 2020-10-31 DIAGNOSIS — E6609 Other obesity due to excess calories: Secondary | ICD-10-CM

## 2020-10-31 DIAGNOSIS — M791 Myalgia, unspecified site: Secondary | ICD-10-CM

## 2020-10-31 DIAGNOSIS — I10 Essential (primary) hypertension: Secondary | ICD-10-CM

## 2020-10-31 DIAGNOSIS — Z6834 Body mass index (BMI) 34.0-34.9, adult: Secondary | ICD-10-CM

## 2020-10-31 NOTE — Chronic Care Management (AMB) (Signed)
    Chronic Care Management Pharmacy Assistant   Name: MYLEIGH AMARA  MRN: [id number] DOB: 10-May-1955  Reason for Encounter: ActX Pharmacogenetic Study  10/31/2020- The ActX pharmacogenomics process and benefits of testing was discussed with the patient. After the discussion, the patient made an informed consent to testing and the sample was collected and mailed to the external lab.    [name], CMA Clinical Pharmacist Assistant [phone]

## 2020-10-31 NOTE — Progress Notes (Signed)
I,Tianna Badgett,acting as a Education administrator for [name], MD.,have documented all relevant documentation on the behalf of [name], MD,as directed by  [name], MD while in the presence of [name], MD.  This visit occurred during the SARS-CoV-2 public health emergency.  Safety protocols were in place, including screening questions prior to the visit, additional usage of staff PPE, and extensive cleaning of exam room while observing appropriate contact time as indicated for disinfecting solutions.  Subjective:     Patient ID: Whitney Sanchez , female    DOB: 1955/04/02 , 65 y.o.   MRN: [id number]   Chief Complaint  Patient presents with   Diabetes   Hypertension    HPI  She presents today for diabetes/HTN f/u.  She reports compliance with meds. She denies headaches, chest pain and shortness of breath. Has not had any issues with meds.   Diabetes She presents for her follow-up diabetic visit. She has type 2 diabetes mellitus. Pertinent negatives for diabetes include no blurred vision and no chest pain. There are no hypoglycemic complications. Risk factors for coronary artery disease include diabetes mellitus, dyslipidemia, hypertension, sedentary lifestyle, post-menopausal and obesity. An ACE inhibitor/angiotensin II receptor blocker is being taken.  Hypertension This is a chronic problem. The current episode started more than 1 year ago. The problem has been gradually improving since onset. The problem is uncontrolled. Pertinent negatives include no blurred vision or chest pain. Risk factors for coronary artery disease include dyslipidemia and diabetes mellitus. The current treatment provides moderate improvement.    Past Medical History:  Diagnosis Date   DM (diabetes mellitus) ([place])    High cholesterol    HTN (hypertension)      Family History  Problem Relation Age of Onset   Diabetes Mother    Healthy Father    Breast cancer Neg Hx    Neuropathy Neg Hx       Current Outpatient Medications:    cyclobenzaprine (FLEXERIL) 5 MG tablet, One tab po qhs prn, Disp: 30 tablet, Rfl: 0   DULoxetine (CYMBALTA) 20 MG capsule, TAKE 1 CAPSULE BY MOUTH EVERY DAY, Disp: 90 capsule, Rfl: 1   losartan (COZAAR) 100 MG tablet, Take 1 tablet (100 mg total) by mouth daily., Disp: 90 tablet, Rfl: 2   Pitavastatin Calcium (LIVALO) 4 MG TABS, Take 1 tablet (4 mg total) by mouth daily., Disp: 90 tablet, Rfl: 1   Semaglutide, 1 MG/DOSE, (OZEMPIC, 1 MG/DOSE,) 4 MG/3ML SOPN, DIAL AND INJECT UNDER THE SKIN 1 MG WEEKLY, Disp: 6 mL, Rfl: 2   No Known Allergies   Review of Systems  Constitutional: Negative.   Eyes:  Negative for blurred vision.  Respiratory: Negative.    Cardiovascular: Negative.  Negative for chest pain.  Gastrointestinal: Negative.   Musculoskeletal:  Positive for arthralgias and myalgias.  Neurological: Negative.     Today's Vitals   10/31/20 1122  BP: 138/80  Pulse: 80  Temp: 98.1 F (36.7 C)  TempSrc: Oral  Weight: 189 lb (85.7 kg)  Height: 5' 2.4" (1.585 m)   Body mass index is 34.13 kg/m.  Wt Readings from Last 3 Encounters:  10/31/20 189 lb (85.7 kg)  09/18/20 184 lb (83.5 kg)  06/24/20 187 lb 3.2 oz (84.9 kg)    Objective:  Physical Exam Vitals and nursing note reviewed.  Constitutional:      Appearance: Normal appearance.  HENT:     Head: Normocephalic and atraumatic.     Nose:  Comments: Masked     Mouth/Throat:     Comments: Masked  Eyes:     Extraocular Movements: Extraocular movements intact.  Cardiovascular:     Rate and Rhythm: Normal rate and regular rhythm.     Heart sounds: Normal heart sounds.  Pulmonary:     Effort: Pulmonary effort is normal.     Breath sounds: Normal breath sounds.  Musculoskeletal:     Cervical back: Normal range of motion.  Skin:    General: Skin is warm.  Neurological:     General: No focal deficit present.     Mental Status: She is alert.  Psychiatric:        Mood and  Affect: Mood normal.        Behavior: Behavior normal.        Assessment And Plan:     1. Uncontrolled type 2 diabetes mellitus with hyperglycemia (HCC) Comments: Chronic, I will check labs as listed below. Encouraged to follow dietary guidelines and to increase daily actiivty as tolerated.  - Hemoglobin A1c - CMP14+EGFR - Lipid panel  2. Essential hypertension, benign Comments: Chronic, fair control. Goal BP is less than 130/80. Encouraged to follow low sodium diet.  3. Myalgia Comments: I will check CPk. Encouraged to increase water intake.  - ANA, IFA (with reflex) - CK, total  4. Arthralgia, unspecified joint Comments: I will check arthritis panel. Encouraged to follow anti-inflammatory diet.  - ANA, IFA (with reflex) - CYCLIC CITRUL PEPTIDE ANTIBODY, IGG/IGA - Rheumatoid factor - Sedimentation rate - Uric acid  5. Class 1 obesity due to excess calories with serious comorbidity and body mass index (BMI) of 34.0 to 34.9 in adult Comments:  She is encouraged to strive for BMI less than 30 to decrease cardiac risk. Advised to aim for at least 150 minutes of exercise per week.  6. Influenza vaccination declined  7. Polypharmacy Comments:  The ActX pharmacogenomics process and benefits of testing was discussed with the patient. After the discussion, the patient made an informed consent to testing and the sample was collected and mailed to the external lab. - [place]  Patient was given opportunity to ask questions. Patient verbalized understanding of the plan and was able to repeat key elements of the plan. All questions were answered to their satisfaction.   I, [name], MD, have reviewed all documentation for this visit. The documentation on 11/02/20 for the exam, diagnosis, procedures, and orders are all accurate and complete.   IF YOU HAVE BEEN REFERRED TO A SPECIALIST, IT MAY TAKE 1-2 WEEKS TO SCHEDULE/PROCESS THE REFERRAL. IF YOU HAVE  NOT HEARD FROM US/SPECIALIST IN TWO WEEKS, PLEASE GIVE [place] A CALL AT [phone].   THE PATIENT IS ENCOURAGED TO PRACTICE SOCIAL DISTANCING DUE TO THE COVID-19 PANDEMIC.

## 2020-10-31 NOTE — Patient Instructions (Signed)
Diabetes Mellitus and Nutrition, Adult When you have diabetes, or diabetes mellitus, it is very important to have healthy eating habits because your blood sugar (glucose) levels are greatly affected by what you eat and drink. Eating healthy foods in the right amounts, at about the same times every day, can help you:  Control your blood glucose.  Lower your risk of heart disease.  Improve your blood pressure.  Reach or maintain a healthy weight. What can affect my meal plan? Every person with diabetes is different, and each person has different needs for a meal plan. Your health care provider may recommend that you work with a dietitian to make a meal plan that is best for you. Your meal plan may vary depending on factors such as:  The calories you need.  The medicines you take.  Your weight.  Your blood glucose, blood pressure, and cholesterol levels.  Your activity level.  Other health conditions you have, such as heart or kidney disease. How do carbohydrates affect me? Carbohydrates, also called carbs, affect your blood glucose level more than any other type of food. Eating carbs naturally raises the amount of glucose in your blood. Carb counting is a method for keeping track of how many carbs you eat. Counting carbs is important to keep your blood glucose at a healthy level, especially if you use insulin or take certain oral diabetes medicines. It is important to know how many carbs you can safely have in each meal. This is different for every person. Your dietitian can help you calculate how many carbs you should have at each meal and for each snack. How does alcohol affect me? Alcohol can cause a sudden decrease in blood glucose (hypoglycemia), especially if you use insulin or take certain oral diabetes medicines. Hypoglycemia can be a life-threatening condition. Symptoms of hypoglycemia, such as sleepiness, dizziness, and confusion, are similar to symptoms of having too much  alcohol.  Do not drink alcohol if: ? Your health care provider tells you not to drink. ? You are pregnant, may be pregnant, or are planning to become pregnant.  If you drink alcohol: ? Do not drink on an empty stomach. ? Limit how much you use to:  0-1 drink a day for women.  0-2 drinks a day for men. ? Be aware of how much alcohol is in your drink. In the [place] one drink equals one 12 oz bottle of beer (355 mL), one 5 oz glass of wine (148 mL), or one 1 oz glass of hard liquor (44 mL). ? Keep yourself hydrated with water, diet soda, or unsweetened iced tea.  Keep in mind that regular soda, juice, and other mixers may contain a lot of sugar and must be counted as carbs. What are tips for following this plan? Reading food labels  Start by checking the serving size on the "Nutrition Facts" label of packaged foods and drinks. The amount of calories, carbs, fats, and other nutrients listed on the label is based on one serving of the item. Many items contain more than one serving per package.  Check the total grams (g) of carbs in one serving. You can calculate the number of servings of carbs in one serving by dividing the total carbs by 15. For example, if a food has 30 g of total carbs per serving, it would be equal to 2 servings of carbs.  Check the number of grams (g) of saturated fats and trans fats in one serving. Choose foods that have   a low amount or none of these fats.  Check the number of milligrams (mg) of salt (sodium) in one serving. Most people should limit total sodium intake to less than 2,300 mg per day.  Always check the nutrition information of foods labeled as "low-fat" or "nonfat." These foods may be higher in added sugar or refined carbs and should be avoided.  Talk to your dietitian to identify your daily goals for nutrients listed on the label. Shopping  Avoid buying canned, pre-made, or processed foods. These foods tend to be high in fat, sodium, and added  sugar.  Shop around the outside edge of the grocery store. This is where you will most often find fresh fruits and vegetables, bulk grains, fresh meats, and fresh dairy. Cooking  Use low-heat cooking methods, such as baking, instead of high-heat cooking methods like deep frying.  Cook using healthy oils, such as olive, canola, or sunflower oil.  Avoid cooking with butter, cream, or high-fat meats. Meal planning  Eat meals and snacks regularly, preferably at the same times every day. Avoid going long periods of time without eating.  Eat foods that are high in fiber, such as fresh fruits, vegetables, beans, and whole grains. Talk with your dietitian about how many servings of carbs you can eat at each meal.  Eat 4-6 oz (112-168 g) of lean protein each day, such as lean meat, chicken, fish, eggs, or tofu. One ounce (oz) of lean protein is equal to: ? 1 oz (28 g) of meat, chicken, or fish. ? 1 egg. ?  cup (62 g) of tofu.  Eat some foods each day that contain healthy fats, such as avocado, nuts, seeds, and fish.   What foods should I eat? Fruits Berries. Apples. Oranges. Peaches. Apricots. Plums. Grapes. Mango. Papaya. Pomegranate. Kiwi. Cherries. Vegetables Lettuce. Spinach. Leafy greens, including kale, chard, collard greens, and mustard greens. Beets. Cauliflower. Cabbage. Broccoli. Carrots. Green beans. Tomatoes. Peppers. Onions. Cucumbers. Brussels sprouts. Grains Whole grains, such as whole-wheat or whole-grain bread, crackers, tortillas, cereal, and pasta. Unsweetened oatmeal. Quinoa. Brown or wild rice. Meats and other proteins Seafood. Poultry without skin. Lean cuts of poultry and beef. Tofu. Nuts. Seeds. Dairy Low-fat or fat-free dairy products such as milk, yogurt, and cheese. The items listed above may not be a complete list of foods and beverages you can eat. Contact a dietitian for more information. What foods should I avoid? Fruits Fruits canned with  syrup. Vegetables Canned vegetables. Frozen vegetables with butter or cream sauce. Grains Refined white flour and flour products such as bread, pasta, snack foods, and cereals. Avoid all processed foods. Meats and other proteins Fatty cuts of meat. Poultry with skin. Breaded or fried meats. Processed meat. Avoid saturated fats. Dairy Full-fat yogurt, cheese, or milk. Beverages Sweetened drinks, such as soda or iced tea. The items listed above may not be a complete list of foods and beverages you should avoid. Contact a dietitian for more information. Questions to ask a health care provider  Do I need to meet with a diabetes educator?  Do I need to meet with a dietitian?  What number can I call if I have questions?  When are the best times to check my blood glucose? Where to find more information:  American Diabetes Association: diabetes.org  Academy of Nutrition and Dietetics: www.eatright.org  National Institute of Diabetes and Digestive and Kidney Diseases: www.niddk.nih.gov  Association of Diabetes Care and Education Specialists: www.diabeteseducator.org Summary  It is important to have healthy eating   habits because your blood sugar (glucose) levels are greatly affected by what you eat and drink.  A healthy meal plan will help you control your blood glucose and maintain a healthy lifestyle.  Your health care provider may recommend that you work with a dietitian to make a meal plan that is best for you.  Keep in mind that carbohydrates (carbs) and alcohol have immediate effects on your blood glucose levels. It is important to count carbs and to use alcohol carefully. This information is not intended to replace advice given to you by your health care provider. Make sure you discuss any questions you have with your health care provider. Document Revised: 12/13/2018 Document Reviewed: 12/13/2018 Elsevier Patient Education  2021 Elsevier Inc.  

## 2020-11-02 ENCOUNTER — Encounter: Payer: Self-pay | Admitting: Internal Medicine

## 2020-11-02 DIAGNOSIS — M2550 Pain in unspecified joint: Secondary | ICD-10-CM | POA: Insufficient documentation

## 2020-11-02 DIAGNOSIS — M791 Myalgia, unspecified site: Secondary | ICD-10-CM | POA: Insufficient documentation

## 2020-11-05 LAB — CMP14+EGFR
ALT: 15 IU/L (ref 0–32)
AST: 19 IU/L (ref 0–40)
Albumin/Globulin Ratio: 1.8 (ref 1.2–2.2)
Albumin: 4.6 g/dL (ref 3.8–4.8)
Alkaline Phosphatase: 74 IU/L (ref 44–121)
BUN/Creatinine Ratio: 21 (ref 12–28)
BUN: 18 mg/dL (ref 8–27)
Bilirubin Total: 0.3 mg/dL (ref 0.0–1.2)
CO2: 22 mmol/L (ref 20–29)
Calcium: 9.5 mg/dL (ref 8.7–10.3)
Chloride: 105 mmol/L (ref 96–106)
Creatinine, Ser: 0.87 mg/dL (ref 0.57–1.00)
Globulin, Total: 2.5 g/dL (ref 1.5–4.5)
Glucose: 87 mg/dL (ref 70–99)
Potassium: 4.4 mmol/L (ref 3.5–5.2)
Sodium: 141 mmol/L (ref 134–144)
Total Protein: 7.1 g/dL (ref 6.0–8.5)
eGFR: 73 mL/min/{1.73_m2} (ref >59)

## 2020-11-05 LAB — SEDIMENTATION RATE: Sed Rate: 7 mm/hr (ref 0–40)

## 2020-11-05 LAB — LIPID PANEL
Chol/HDL Ratio: 2.9 ratio (ref 0.0–4.4)
Cholesterol, Total: 164 mg/dL (ref 100–199)
HDL: 56 mg/dL (ref >39)
LDL Chol Calc (NIH): 78 mg/dL (ref 0–99)
Triglycerides: 180 mg/dL — ABNORMAL HIGH (ref 0–149)
VLDL Cholesterol Cal: 30 mg/dL (ref 5–40)

## 2020-11-05 LAB — RHEUMATOID FACTOR: Rheumatoid fact SerPl-aCnc: <10 IU/mL (ref <14.0)

## 2020-11-05 LAB — CYCLIC CITRUL PEPTIDE ANTIBODY, IGG/IGA: Cyclic Citrullin Peptide Ab: 25 units — ABNORMAL HIGH (ref 0–19)

## 2020-11-05 LAB — HEMOGLOBIN A1C
Est. average glucose Bld gHb Est-mCnc: 123 mg/dL
Hgb A1c MFr Bld: 5.9 % — ABNORMAL HIGH (ref 4.8–5.6)

## 2020-11-05 LAB — CK: Total CK: 129 U/L (ref 32–182)

## 2020-11-05 LAB — URIC ACID: Uric Acid: 4.5 mg/dL (ref 3.0–7.2)

## 2020-11-05 LAB — ANTINUCLEAR ANTIBODIES, IFA: ANA Titer 1: NEGATIVE

## 2020-11-06 ENCOUNTER — Telehealth: Payer: Self-pay

## 2020-11-06 NOTE — Chronic Care Management (AMB) (Signed)
    Chronic Care Management Pharmacy Assistant   Name: Whitney Sanchez  MRN: [id number] DOB: 06-15-1955   Reason for Encounter: Disease State/ Diabetes  Recent office visits:  10-31-2020 [name], MD. Cyclic citrul peptide antibody= 25. A1C= 5.9. Trig= 180.  Recent consult visits:  None  Hospital visits:  None in previous 6 months  Medications: Outpatient Encounter Medications as of 11/06/2020  Medication Sig   cyclobenzaprine (FLEXERIL) 5 MG tablet One tab po qhs prn   DULoxetine (CYMBALTA) 20 MG capsule TAKE 1 CAPSULE BY MOUTH EVERY DAY   losartan (COZAAR) 100 MG tablet Take 1 tablet (100 mg total) by mouth daily.   Pitavastatin Calcium (LIVALO) 4 MG TABS Take 1 tablet (4 mg total) by mouth daily.   Semaglutide, 1 MG/DOSE, (OZEMPIC, 1 MG/DOSE,) 4 MG/3ML SOPN DIAL AND INJECT UNDER THE SKIN 1 MG WEEKLY   No facility-administered encounter medications on file as of 11/06/2020.  Recent Relevant Labs: Lab Results  Component Value Date/Time   HGBA1C 5.9 (H) 10/31/2020 11:57 AM   HGBA1C 5.9 (H) 06/24/2020 12:03 PM   HGBA1C 6.4 08/16/2017 12:00 AM   MICROALBUR 10 10/10/2018 04:56 PM    Kidney Function Lab Results  Component Value Date/Time   CREATININE 0.87 10/31/2020 11:57 AM   CREATININE 0.84 06/24/2020 12:03 PM   GFRNONAA 70 02/23/2020 11:33 AM   GFRAA 80 02/23/2020 11:33 AM    Current antihyperglycemic regimen:  Ozempic 1 mg weekly  What recent interventions/DTPs have been made to improve glycemic control:  Educated on Exercise goal of 150 minutes per week; Prevention and management of hypoglycemic episodes; Counseled to check feet daily and get yearly eye exams Recommended to continue current medication  Have there been any recent hospitalizations or ED visits since last visit with CPP? No  Patient denies hypoglycemic symptoms  Patient denies hyperglycemic symptoms  How often are you checking your blood sugar? once daily  What are your blood sugars  ranging?  Fasting: 96 Before meals: None After meals: None Bedtime: None  During the week, how often does your blood glucose drop below 70? Never  Are you checking your feet daily/regularly? Daily  Adherence Review: Is the patient currently on a STATIN medication? Yes Is the patient currently on ACE/ARB medication? Yes Does the patient have >5 day gap between last estimated fill dates? No   Care Gaps: Shingrix overdue Covid booster overdue Last medicare wellness 02-23-2020 Flu vaccine overdue A1C 10-31-2020 5.9 BP 10-31-2020 138/80  Star Rating Drugs: Ozempic 1 mg- Patient assistance Losartan 100 mg- Last filled 06-02-2020 90 DS [facility] (Patient states she just received some last week) Livalo 4 mg- Patient assistance  [name] [facility] Clinical Pharmacist Assistant [phone]

## 2020-12-16 ENCOUNTER — Telehealth: Payer: Self-pay

## 2020-12-16 NOTE — Chronic Care Management (AMB) (Signed)
    Chronic Care Management Pharmacy Assistant   Name: Whitney Sanchez  MRN: [id number] DOB: [birth year]  Reason for Encounter: Disease State/ Diabetes  Recent office visits:  None  Recent consult visits:  None  Hospital visits:  None in previous 6 months  Medications: Outpatient Encounter Medications as of 12/16/2020  Medication Sig   cyclobenzaprine (FLEXERIL) 5 MG tablet One tab po qhs prn   DULoxetine (CYMBALTA) 20 MG capsule TAKE 1 CAPSULE BY MOUTH EVERY DAY   losartan (COZAAR) 100 MG tablet Take 1 tablet (100 mg total) by mouth daily.   Pitavastatin Calcium (LIVALO) 4 MG TABS Take 1 tablet (4 mg total) by mouth daily.   Semaglutide, 1 MG/DOSE, (OZEMPIC, 1 MG/DOSE,) 4 MG/3ML SOPN DIAL AND INJECT UNDER THE SKIN 1 MG WEEKLY   No facility-administered encounter medications on file as of 12/16/2020.   Recent Relevant Labs: Lab Results  Component Value Date/Time   HGBA1C 5.9 (H) 10/31/2020 11:57 AM   HGBA1C 5.9 (H) 06/24/2020 12:03 PM   HGBA1C 6.4 08/16/2017 12:00 AM   MICROALBUR 10 10/10/2018 04:56 PM    Kidney Function Lab Results  Component Value Date/Time   CREATININE 0.87 10/31/2020 11:57 AM   CREATININE 0.84 06/24/2020 12:03 PM   GFRNONAA 70 02/23/2020 11:33 AM   GFRAA 80 02/23/2020 11:33 AM    Current antihyperglycemic regimen:  Ozempic 1 mg weekly  What recent interventions/DTPs have been made to improve glycemic control:  Educated on Exercise goal of 150 minutes per week; Prevention and management of hypoglycemic episodes; Counseled to check feet daily and get yearly eye exams Recommended to continue current medication  Have there been any recent hospitalizations or ED visits since last visit with CPP? No  Patient denies hypoglycemic symptoms  Patient denies hyperglycemic symptoms  How often are you checking your blood sugar? once daily  What are your blood sugars ranging?  Fasting: 94 Before meals: None After meals: None Bedtime:  None  During the week, how often does your blood glucose drop below 70? Never  Are you checking your feet daily/regularly? Daily  Adherence Review: Is the patient currently on a STATIN medication? Yes Is the patient currently on ACE/ARB medication? Yes Does the patient have >5 day gap between last estimated fill dates? Yes  NOTES: Patient stated she couldn't recall what income verification was provided last year for patient assistance. [employer] and was informed that patient provided social security. Informed patient.   Care Gaps: Shingrix overdue PNA VAC overdue Flu vaccine overdue 3rd Moderna vaccine overdue Last AWV 02-23-2020  Star Rating Drugs: Ozempic 1 mg- Patient assistance Losartan 100 mg- Last filled 08-04-2020 90 DS [facility] (Patient states she just received some last week) Livalo 4 mg- Patient assistance  [name] [facility] Clinical Pharmacist Assistant [phone]

## 2020-12-22 ENCOUNTER — Other Ambulatory Visit: Payer: Self-pay | Admitting: Internal Medicine

## 2020-12-22 DIAGNOSIS — I10 Essential (primary) hypertension: Secondary | ICD-10-CM

## 2020-12-25 ENCOUNTER — Telehealth: Payer: Self-pay

## 2020-12-25 NOTE — Chronic Care Management (AMB) (Signed)
    Called Whitney Sanchez, No answer, left message of appointment on 12-26-2020 at 3:00 via telephone visit with [name], Pharm D. Notified to have all medications, supplements, blood pressure and/or blood sugar logs available during appointment and to return call if need to reschedule.   Care Gaps: Shingrix overdue PNA VAC overdue Flu vaccine overdue 3rd Moderna vaccine overdue Last AWV 02-23-2020  Star Rating Drug: Ozempic 1 mg- Patient assistance Losartan 100 mg- Last filled 10-28-2020 90 DS [facility]  Livalo 4 mg- Patient assistance  Any gaps in medications fill history? No  [name] [facility] Clinical Pharmacist Assistant [phone]

## 2020-12-26 ENCOUNTER — Ambulatory Visit (INDEPENDENT_AMBULATORY_CARE_PROVIDER_SITE_OTHER): Payer: Medicare Other

## 2020-12-26 DIAGNOSIS — E1165 Type 2 diabetes mellitus with hyperglycemia: Secondary | ICD-10-CM

## 2020-12-26 DIAGNOSIS — I10 Essential (primary) hypertension: Secondary | ICD-10-CM

## 2020-12-26 NOTE — Progress Notes (Signed)
Chronic Care Management Pharmacy Note  12/26/2020 Name:  Whitney Sanchez MRN:  [id number] DOB:  1955-04-02  Summary: Patient reports that she has been doing well. She sent in her Ozempic patient assistance paperwork for [place] to complete and return back.   Recommendations/Changes made from today's visit: Recommend patient receive COVID-19 booster and flu vaccine. Recommend patient keep her plate 1/2 filled with vegetables.   Plan: Plan for patient to receive COVID-19 and Flu shot at [facility].    Subjective: Whitney Sanchez is an 65 y.o. year old female who is a primary patient of Glendale Chard, MD.  The CCM team was consulted for assistance with disease management and care coordination needs.    Engaged with patient by telephone for follow up visit in response to provider referral for pharmacy case management and/or care coordination services.   Consent to Services:  The patient was given information about Chronic Care Management services, agreed to services, and gave verbal consent prior to initiation of services.  Please see initial visit note for detailed documentation.   Patient Care Team: Glendale Chard, MD as PCP - General (Internal Medicine) Mayford Knife, [facility] (Pharmacist)  Recent office visits: 10/31/2020 PCP OV 06/24/2020 PCP OV  Recent consult visits: 09/18/2020 Neurology [facility] visits: None in previous 6 months   Objective:  Lab Results  Component Value Date   CREATININE 0.87 10/31/2020   BUN 18 10/31/2020   GFRNONAA 70 02/23/2020   GFRAA 80 02/23/2020   NA 141 10/31/2020   K 4.4 10/31/2020   CALCIUM 9.5 10/31/2020   CO2 22 10/31/2020   GLUCOSE 87 10/31/2020    Lab Results  Component Value Date/Time   HGBA1C 5.9 (H) 10/31/2020 11:57 AM   HGBA1C 5.9 (H) 06/24/2020 12:03 PM   HGBA1C 6.4 08/16/2017 12:00 AM   MICROALBUR 10 10/10/2018 04:56 PM    Last diabetic Eye exam:  Lab Results  Component Value  Date/Time   HMDIABEYEEXA No Retinopathy 05/29/2020 12:00 AM    Last diabetic Foot exam: No results found for: HMDIABFOOTEX   Lab Results  Component Value Date   CHOL 164 10/31/2020   HDL 56 10/31/2020   LDLCALC 78 10/31/2020   TRIG 180 (H) 10/31/2020   CHOLHDL 2.9 10/31/2020    Hepatic Function Latest Ref Rng & Units 10/31/2020 02/23/2020 09/27/2019  Total Protein 6.0 - 8.5 g/dL 7.1 7.3 7.4  Albumin 3.8 - 4.8 g/dL 4.6 4.7 4.8  AST 0 - 40 IU/L '19 20 21  ' ALT 0 - 32 IU/L '15 22 21  ' Alk Phosphatase 44 - 121 IU/L 74 83 80  Total Bilirubin 0.0 - 1.2 mg/dL 0.3 0.3 0.4    No results found for: TSH, FREET4  CBC Latest Ref Rng & Units 09/27/2019 10/10/2018  WBC 3.4 - 10.8 x10E3/uL 5.9 8.2  Hemoglobin 11.1 - 15.9 g/dL 12.1 11.1  Hematocrit 34.0 - 46.6 % 36.9 34.6  Platelets 150 - 450 x10E3/uL 276 284    Lab Results  Component Value Date/Time   VD25OH 36.9 11/10/2016 12:00 AM    Clinical ASCVD: No  The 10-year ASCVD risk score (Arnett DK, et al., 2019) is: 22.4%   Values used to calculate the score:     Age: 65 years     Sex: Female     Is Non-Hispanic African American: Yes     Diabetic: Yes     Tobacco smoker: No     Systolic Blood Pressure: 355 mmHg  Is BP treated: Yes     HDL Cholesterol: 56 mg/dL     Total Cholesterol: 164 mg/dL    Depression screen [facility] 2/9 10/31/2020 10/18/2019 10/10/2018  Decreased Interest 0 0 0  Down, Depressed, Hopeless 0 0 0  PHQ - 2 Score 0 0 0  Altered sleeping 0 - -  Tired, decreased energy 0 - -  Change in appetite 0 - -  Feeling bad or failure about yourself  0 - -  Trouble concentrating 0 - -  Moving slowly or fidgety/restless 0 - -  Suicidal thoughts 0 - -  PHQ-9 Score 0 - -     Social History   Tobacco Use  Smoking Status Never  Smokeless Tobacco Never   BP Readings from Last 3 Encounters:  10/31/20 138/80  09/18/20 (!) 171/97  06/24/20 124/80   Pulse Readings from Last 3 Encounters:  10/31/20 80  09/18/20 80  06/24/20 91    Wt Readings from Last 3 Encounters:  10/31/20 189 lb (85.7 kg)  09/18/20 184 lb (83.5 kg)  06/24/20 187 lb 3.2 oz (84.9 kg)   BMI Readings from Last 3 Encounters:  10/31/20 34.13 kg/m  09/18/20 31.58 kg/m  06/24/20 33.80 kg/m    Assessment/Interventions: Review of patient past medical history, allergies, medications, health status, including review of consultants reports, laboratory and other test data, was performed as part of comprehensive evaluation and provision of chronic care management services.   SDOH:  (Social Determinants of Health) assessments and interventions performed: Yes SDOH Interventions    Flowsheet Row Most Recent Value  SDOH Interventions   Financial Strain Interventions --  [Patient assistance completed for Ozempic]      SDOH Screenings   Alcohol Screen: Not on file  Depression (PHQ2-9): Low Risk    PHQ-2 Score: 0  Financial Resource Strain: High Risk   Difficulty of Paying Living Expenses: Very hard  Food Insecurity: No Food Insecurity   Worried About Charity fundraiser in the Last Year: Never true   Ran Out of Food in the Last Year: Never true  Housing: Low Risk    Last Housing Risk Score: 0  Physical Activity: Insufficiently Active   Days of Exercise per Week: 2 days   Minutes of Exercise per Session: 20 min  Social Connections: Moderately Integrated   Frequency of Communication with Friends and Family: More than three times a week   Frequency of Social Gatherings with Friends and Family: Once a week   Attends Religious Services: More than 4 times per year   Active Member of [employer] or Organizations: No   Attends Music therapist: Never   Marital Status: Married  Stress: No Stress Concern Present   Feeling of Stress : Not at all  Tobacco Use: Low Risk    Smoking Tobacco Use: Never   Smokeless Tobacco Use: Never   Passive Exposure: Not on file  Transportation Needs: Unknown   Lack of Transportation (Medical): No   Lack of  Transportation (Non-Medical): Not on file    CCM Care Plan  No Known Allergies  Medications Reviewed Today     Reviewed by Glendale Chard, MD (Physician) on 11/02/20 at [place] List Status: <None>   Medication Order Taking? Sig Documenting Provider Last Dose Status Informant  cyclobenzaprine (FLEXERIL) 5 MG tablet [id number] No One tab po qhs prn Glendale Chard, MD Taking Active   DULoxetine (CYMBALTA) 20 MG capsule [id number] No TAKE 1 CAPSULE BY MOUTH EVERY DAY Baird Cancer,  Bailey Mech, MD Taking Active   losartan (COZAAR) 100 MG tablet [id number] No Take 1 tablet (100 mg total) by mouth daily. Glendale Chard, MD Taking Active   Pitavastatin Calcium (LIVALO) 4 MG TABS [id number] No Take 1 tablet (4 mg total) by mouth daily. Glendale Chard, MD Taking Active   Semaglutide, 1 MG/DOSE, (OZEMPIC, 1 MG/DOSE,) 4 MG/3ML Bonney Aid [id number] No DIAL AND INJECT UNDER THE SKIN 1 MG WEEKLY Glendale Chard, MD Taking Active             Patient Active Problem List   Diagnosis Date Noted   Myalgia 11/02/2020   Arthralgia 11/02/2020   Numbness and tingling of hand 09/18/2020   Uncontrolled type 2 diabetes mellitus with hyperglycemia ([place]) 03/17/2018   Essential hypertension, benign 03/17/2018   Pure hypercholesterolemia 03/17/2018   Class 1 obesity due to excess calories with serious comorbidity and body mass index (BMI) of 34.0 to 34.9 in adult 03/17/2018    Immunization History  Administered Date(s) Administered   Fluad Quad(high Dose 65+) 10/18/2019   Influenza-Unspecified 10/19/2017   Moderna SARS-COV2 Booster Vaccination 11/17/2019   Moderna Sars-Covid-2 Vaccination 01/21/2019, 02/18/2019   Pneumococcal Polysaccharide-23 02/05/2015    Conditions to be addressed/monitored:  Hypertension and Diabetes  Care Plan : [place]  Updates made by Mayford Knife, [place] since 12/26/2020 12:00 AM     Problem: HTN, DM II   Priority: High     Goal: Disease Management   This Visit's  Progress: On track  Recent Progress: On track  Priority: High  Note:     Current Barriers:  Unable to independently afford treatment regimen Does not maintain contact with provider office Does not contact provider office for questions/concerns  Pharmacist Clinical Goal(s):  Patient will verbalize ability to afford treatment regimen achieve adherence to monitoring guidelines and medication adherence to achieve therapeutic efficacy through collaboration with PharmD and provider.   Interventions: 1:1 collaboration with Glendale Chard, MD regarding development and update of comprehensive plan of care as evidenced by provider attestation and co-signature Inter-disciplinary care team collaboration (see longitudinal plan of care) Comprehensive medication review performed; medication list updated in electronic medical record  Hypertension (BP goal <130/80) -Controlled -Current treatment: Losartan 100 mg tablet once per day  -Current home readings: she is not currently checking her BP at home  -Current dietary habits: she is cooking with salt, but she does not add more salt to her food  -Current exercise habits: she was walking some once a week, she and her husband go walking 30 minutes  -Denies hypotensive/hypertensive symptoms -Educated on Daily salt intake goal < 2300 mg; Exercise goal of 150 minutes per week; -Counseled to monitor BP at home at least three times per week, document, and provide log at future appointments -Counseled on diet and exercise extensively   Diabetes (A1c goal <7%) -Controlled -Current medications: Ozempic 1 mg once a week on Tuesday she currently has 6 more weeks left of medication  -Denies hypoglycemic/hyperglycemic symptoms -Current meal patterns: patient reports that she does not as much, she gets full and she does not want to eat anymore drinks: water, she has a large reusable cup and she refills the cup at three times per day and it is 20 ounces   -Current exercise: she is currently exercising 30 minutes once a week, encouraged patient to walk on her treadmill she has a goal of at least once per week for 10 minutes  -Educated on A1c and blood sugar goals; Carbohydrate counting  and/or plate method -Counseled to check feet daily and get yearly eye exams -Counseled on diet and exercise extensively  Patient Goals/Self-Care Activities Patient will:  - take medications as prescribed as evidenced by patient report and record review collaborate with provider on medication access solutions  Follow Up Plan: The patient has been provided with contact information for the care management team and has been advised to call with any health related questions or concerns.        Medication Assistance:  Ozempic obtained through [employer] medication assistance program.  Enrollment ends 12/2020 . Patient assistance application for 5615 in process.   Compliance/Adherence/Medication fill history: Care Gaps: Shingrix Vaccine  Pneumonia Vaccine COVID-19 Vaccine  Influenza Vaccine   Star-Rating Drugs: Ozempic 1 mg injection  Losartan 100 mg tablet   Patient's preferred pharmacy is:  [id number] [place], [place] - [id number]: [phone]: [phone] [facility] [id number]: [phone]: [phone] Uses pill box? No - patient is organized with pill bottles  Pt endorses 95% compliance  We discussed: Current pharmacy is preferred with insurance plan and patient is satisfied with pharmacy services Patient decided to: Continue current medication management strategy  Care Plan and Follow Up Patient Decision:  Patient agrees to Care Plan and Follow-up.  Plan: The patient has been provided with contact information for  the care management team and has been advised to call with any health related questions or concerns.   VOrlando Penner CPP, PharmD Clinical Pharmacist Practitioner [facility] [phone]

## 2020-12-31 ENCOUNTER — Other Ambulatory Visit: Payer: Self-pay

## 2020-12-31 ENCOUNTER — Ambulatory Visit (INDEPENDENT_AMBULATORY_CARE_PROVIDER_SITE_OTHER): Payer: Medicare Other

## 2020-12-31 VITALS — BP 136/88 | HR 94 | Temp 98.2°F | Ht 62.4 in | Wt 184.6 lb

## 2020-12-31 DIAGNOSIS — Z23 Encounter for immunization: Secondary | ICD-10-CM

## 2020-12-31 NOTE — Patient Instructions (Signed)
Avian Influenza, Adult Avian influenza is also known as bird flu. It is a group of viruses that occur naturally in wild birds. Avian influenza is easily spread among birds and is deadly to them. Birds and other animals can become infected through contact with infected birds or contaminated surfaces. The avian influenza viruses are spread from country to country through the international poultry trade or by migrating birds. Although rare, avian influenza can cause severe illness in humans. It is also rare for there to be human-to-human transmission of avian influenza, but this can occur where there is prolonged contact with a person who is severely ill with avian influenza. Influenza viruses can change, so human-to-human transmission may become more likely in the future. What are the causes? This condition is caused by infected birds that spread avian influenza through their feces, saliva, and fluids from their nostrils. Avian influenza can then infect humans who: Come in contact with infected animals and then touch their eyes, nose, or mouth. This can happen with an animal that is dead or alive. Breathe in contaminated dust. Touch contaminated surfaces. What increases the risk? This condition is more likely to develop in people who come in close contact with birds or poultry. What are the signs or symptoms? Symptoms of this condition include: Fever. Cough. Sore throat. Muscle aches. Tiredness (fatigue). Inflammation or redness of the eyes (conjunctivitis). Difficulty breathing. Shortness of breath. Nausea and vomiting. Diarrhea. Pain in the abdomen. Mental confusion. Seizures. How is this diagnosed? This condition may be diagnosed with: Medical history and physical exam. Testing of a fluid sample from your throat or nose. Chest X-ray. Blood tests. How is this treated? This condition may be treated with: Medicines that stop the growth of the viruses in your body (antiviral  medicines). Supportive care to relieve your symptoms. You may need to stay in the hospital. At the hospital, safety measures (contact precautions) are taken to prevent the infection from spreading to others. You may be separated (isolated) from other patients in the hospital, and health care workers may wear protective clothing, gloves, masks, and goggles to cover their body. Follow these instructions at home:   Take over-the-counter and prescription medicines only as told by your health care provider. Rest as told by your health care provider. Drink enough fluid to keep your urine pale yellow. Cover your mouth and nose when coughing or sneezing. Wash your hands often with soap and water for at least 20 seconds to prevent the viruses from spreading. If soap and water are not available, use hand sanitizer. Use a cool mist humidifier. This makes breathing easier. Avoid crowded areas. Stay home from work or school until your health care provider says it is okay to return. Keep all follow-up visits. This is important. How is this prevented? Stay home from work and school when you are sick. Not being in contact with other people will help stop the spread of illness. Cover your mouth and nose with your arm when you cough or sneeze. This may help keep those around you from getting sick. Wash your hands often with warm water and soap for at least 20 seconds. Illnesses are often spread when a person touches something that is contaminated with germs and then touches his or her eyes, nose, or mouth. Stay up to date on all vaccines, including the annual influenza vaccine. The annual influenza vaccine cannot prevent avian influenza, but it can prevent other infections from occurring. If you work with animals or have domestic animals,  wash your hands often. If there is a chance that the animals are infected, wear protective equipment, including protective clothing, gloves, masks, goggles, and boots. If you  think that you have been exposed to avian influenza, ask your health care provider about preventive antiviral medicines. These can help prevent infection from occurring. Contact a health care provider if: You have a fever. You have a skin rash. You have new symptoms. Your symptoms get worse. You are urinating noticeably less or not at all. Your symptoms do not get better with treatment. Get help right away if: Your skin or nails turn bluish. You have chest pain. You have trouble breathing. You feel like your heart is fluttering, skipping a beat, or beating faster than normal. You become confused. You have chills, weakness, or light-headedness. You develop a sudden headache. You develop pain in your face or ear. You have severe pain or stiffness in your neck. You cough up blood. You have nausea and vomiting that you cannot control. These symptoms may represent a serious problem that is an emergency. Do not wait to see if the symptoms will go away. Get medical help right away. Call your local emergency services (911 in the [place] Do not drive yourself to the hospital. Summary Avian influenza, also known as bird flu, is a group of viruses that occur naturally in wild birds. Avian influenza is easily spread (contagious) among birds and is deadly to them. On rare occasions, it may spread to humans who come into contact with infected birds. Human-to-human infection is rare but can occur where there is prolonged contact with a person who is severely ill with avian influenza. Contact your health care provider if you think you have been exposed to avian influenza. You may be given antiviral medicine to help prevent infection from occurring. This information is not intended to replace advice given to you by your health care provider. Make sure you discuss any questions you have with your health care provider. Document Revised: 07/28/2019 Document Reviewed: 07/28/2019 Elsevier Patient Education   2022 [employer].

## 2020-12-31 NOTE — Progress Notes (Signed)
The patient is here today for her seasonal flu vaccination.

## 2021-01-02 NOTE — Patient Instructions (Signed)
Visit Information It was great speaking with you today!  Please let me know if you have any questions about our visit.   Goals Addressed             This Visit's Progress    Manage My Medicine       Timeframe:  Long-Range Goal Priority:  High Start Date:                             Expected End Date:                       Follow Up Date 07/01/2021   In Progress:  - call for medicine refill 2 or 3 days before it runs out - call if I am sick and can't take my medicine - keep a list of all the medicines I take; vitamins and herbals too - learn to read medicine labels - use a pillbox to sort medicine    Why is this important?   These steps will help you keep on track with your medicines.   Notes:  -Please make sure to check your mail for follow up from patient assistance for Livalo and Ozempic.         Patient Care Plan: CCM Pharmacy Care Plan     Problem Identified: HTN, DM II   Priority: High     Goal: Disease Management   This Visit's Progress: On track  Recent Progress: On track  Priority: High  Note:     Current Barriers:  Unable to independently afford treatment regimen Does not maintain contact with provider office Does not contact provider office for questions/concerns  Pharmacist Clinical Goal(s):  Patient will verbalize ability to afford treatment regimen achieve adherence to monitoring guidelines and medication adherence to achieve therapeutic efficacy through collaboration with PharmD and provider.   Interventions: 1:1 collaboration with [name], MD regarding development and update of comprehensive plan of care as evidenced by provider attestation and co-signature Inter-disciplinary care team collaboration (see longitudinal plan of care) Comprehensive medication review performed; medication list updated in electronic medical record  Hypertension (BP goal <130/80) -Controlled -Current treatment: Losartan 100 mg tablet once per day   -Current home readings: she is not currently checking her BP at home  -Current dietary habits: she is cooking with salt, but she does not add more salt to her food  -Current exercise habits: she was walking some once a week, she and her husband go walking 30 minutes  -Denies hypotensive/hypertensive symptoms -Educated on Daily salt intake goal < 2300 mg; Exercise goal of 150 minutes per week; -Counseled to monitor BP at home at least three times per week, document, and provide log at future appointments -Counseled on diet and exercise extensively   Diabetes (A1c goal <7%) -Controlled -Current medications: Ozempic 1 mg once a week on Tuesday she currently has 6 more weeks left of medication  -Denies hypoglycemic/hyperglycemic symptoms -Current meal patterns: patient reports that she does not as much, she gets full and she does not want to eat anymore drinks: water, she has a large reusable cup and she refills the cup at three times per day and it is 20 ounces  -Current exercise: she is currently exercising 30 minutes once a week, encouraged patient to walk on her treadmill she has a goal of at least once per week for 10 minutes  -Educated on A1c and blood sugar goals; Carbohydrate counting  and/or plate method -Counseled to check feet daily and get yearly eye exams -Counseled on diet and exercise extensively  Patient Goals/Self-Care Activities Patient will:  - take medications as prescribed as evidenced by patient report and record review collaborate with provider on medication access solutions  Follow Up Plan: The patient has been provided with contact information for the care management team and has been advised to call with any health related questions or concerns.        Patient agreed to services and verbal consent obtained.   The patient verbalized understanding of instructions, educational materials, and care plan provided today and agreed to receive a mailed copy of  patient instructions, educational materials, and care plan.   [name], PharmD Clinical Pharmacist [facility] [phone]

## 2021-01-13 ENCOUNTER — Other Ambulatory Visit: Payer: Self-pay | Admitting: Internal Medicine

## 2021-01-14 ENCOUNTER — Ambulatory Visit (INDEPENDENT_AMBULATORY_CARE_PROVIDER_SITE_OTHER): Payer: Medicare Other

## 2021-01-14 ENCOUNTER — Other Ambulatory Visit: Payer: Self-pay

## 2021-01-14 DIAGNOSIS — Z23 Encounter for immunization: Secondary | ICD-10-CM

## 2021-01-18 DIAGNOSIS — I10 Essential (primary) hypertension: Secondary | ICD-10-CM

## 2021-01-18 DIAGNOSIS — E1165 Type 2 diabetes mellitus with hyperglycemia: Secondary | ICD-10-CM | POA: Diagnosis not present

## 2021-01-22 ENCOUNTER — Telehealth: Payer: Self-pay

## 2021-01-22 NOTE — Telephone Encounter (Signed)
Patient notified that her ozempic medication has been delivered from the [employer] pt assistance program and is ready for pickup

## 2021-02-05 ENCOUNTER — Encounter: Payer: Self-pay | Admitting: Internal Medicine

## 2021-02-05 ENCOUNTER — Other Ambulatory Visit: Payer: Self-pay

## 2021-02-05 ENCOUNTER — Ambulatory Visit (INDEPENDENT_AMBULATORY_CARE_PROVIDER_SITE_OTHER): Payer: Medicare Other | Admitting: Internal Medicine

## 2021-02-05 VITALS — BP 118/80 | HR 90 | Temp 98.5°F | Ht 61.8 in | Wt 182.4 lb

## 2021-02-05 DIAGNOSIS — E1165 Type 2 diabetes mellitus with hyperglycemia: Secondary | ICD-10-CM | POA: Diagnosis not present

## 2021-02-05 DIAGNOSIS — I10 Essential (primary) hypertension: Secondary | ICD-10-CM

## 2021-02-05 DIAGNOSIS — Z23 Encounter for immunization: Secondary | ICD-10-CM | POA: Diagnosis not present

## 2021-02-05 DIAGNOSIS — Z6833 Body mass index (BMI) 33.0-33.9, adult: Secondary | ICD-10-CM

## 2021-02-05 DIAGNOSIS — E6609 Other obesity due to excess calories: Secondary | ICD-10-CM | POA: Diagnosis not present

## 2021-02-05 NOTE — Progress Notes (Signed)
Rich Brave [place],acting as a Education administrator for [name], MD.,have documented all relevant documentation on the behalf of [name], MD,as directed by  [name], MD while in the presence of [name], MD.  This visit occurred during the SARS-CoV-2 public health emergency.  Safety protocols were in place, including screening questions prior to the visit, additional usage of staff PPE, and extensive cleaning of exam room while observing appropriate contact time as indicated for disinfecting solutions.  Subjective:     Patient ID: Whitney Sanchez , female    DOB: [birth year] , 66 y.o.   MRN: [id number]   Chief Complaint  Patient presents with   Hypertension   Diabetes    HPI  She presents today for diabetes/HTN f/u.  She reports compliance with meds.  She denies headaches, chest pain and shortness of breath.   Diabetes She presents for her follow-up diabetic visit. She has type 2 diabetes mellitus. Her disease course has been stable. Pertinent negatives for diabetes include no blurred vision and no chest pain. There are no hypoglycemic complications. Risk factors for coronary artery disease include diabetes mellitus, dyslipidemia, hypertension, sedentary lifestyle, post-menopausal and obesity. She participates in exercise intermittently. An ACE inhibitor/angiotensin II receptor blocker is being taken.  Hypertension This is a chronic problem. The current episode started more than 1 year ago. The problem has been gradually improving since onset. The problem is uncontrolled. Pertinent negatives include no blurred vision or chest pain. Risk factors for coronary artery disease include dyslipidemia and diabetes mellitus. The current treatment provides moderate improvement.    Past Medical History:  Diagnosis Date   DM (diabetes mellitus) ([facility])    High cholesterol    HTN (hypertension)      Family History  Problem Relation Age of Onset   Diabetes Mother    Healthy  Father    Breast cancer Neg Hx    Neuropathy Neg Hx      Current Outpatient Medications:    DULoxetine (CYMBALTA) 20 MG capsule, TAKE 1 CAPSULE BY MOUTH EVERY DAY, Disp: 90 capsule, Rfl: 1   losartan (COZAAR) 100 MG tablet, TAKE 1 TABLET BY MOUTH  DAILY, Disp: 90 tablet, Rfl: 3   Pitavastatin Calcium (LIVALO) 4 MG TABS, Take 1 tablet (4 mg total) by mouth daily., Disp: 90 tablet, Rfl: 1   Semaglutide, 1 MG/DOSE, (OZEMPIC, 1 MG/DOSE,) 4 MG/3ML SOPN, DIAL AND INJECT UNDER THE SKIN 1 MG WEEKLY, Disp: 6 mL, Rfl: 2   cyclobenzaprine (FLEXERIL) 5 MG tablet, One tab po qhs prn (Patient not taking: Reported on 02/05/2021), Disp: 30 tablet, Rfl: 0   No Known Allergies   Review of Systems  Constitutional: Negative.   Eyes:  Negative for blurred vision.  Respiratory: Negative.    Cardiovascular: Negative.  Negative for chest pain.  Neurological: Negative.   Psychiatric/Behavioral: Negative.      Today's Vitals   02/05/21 1050  BP: 118/80  Pulse: 90  Temp: 98.5 F (36.9 C)  Weight: 182 lb 6.4 oz (82.7 kg)  Height: 5' 1.8" (1.57 m)  PainSc: 0-No pain   Body mass index is 33.58 kg/m.  Wt Readings from Last 3 Encounters:  02/05/21 182 lb 6.4 oz (82.7 kg)  12/31/20 184 lb 9.6 oz (83.7 kg)  10/31/20 189 lb (85.7 kg)     Objective:  Physical Exam Vitals and nursing note reviewed.  Constitutional:      Appearance: Normal appearance.  HENT:     Head: Normocephalic  and atraumatic.     Nose:     Comments: Masked     Mouth/Throat:     Comments: Masked  Eyes:     Extraocular Movements: Extraocular movements intact.  Cardiovascular:     Rate and Rhythm: Normal rate and regular rhythm.     Heart sounds: Normal heart sounds.  Pulmonary:     Effort: Pulmonary effort is normal.     Breath sounds: Normal breath sounds.  Musculoskeletal:     Cervical back: Normal range of motion.  Skin:    General: Skin is warm.  Neurological:     General: No focal deficit present.     Mental  Status: She is alert.  Psychiatric:        Mood and Affect: Mood normal.        Behavior: Behavior normal.      Assessment And Plan:     1. Uncontrolled type 2 diabetes mellitus with hyperglycemia (HCC) Comments: Chronic, I will check labs today. I will adjust meds as needed. Importance of medication/dietary compliance was d/w patient. She will f/u 4 months. - Hemoglobin A1c - BMP8+EGFR  2. Essential hypertension, benign Comments: Chronic, well controlled. No med changes. She is encouraged to follow low sodium diet.   3. Class 1 obesity due to excess calories with serious comorbidity and body mass index (BMI) of 33.0 to 33.9 in adult Comments: She is encouraged to strive for BMI less than 30 to decrease cardiac risk. Advised to aim for at least 150 minutes of exercise per week.   4. Immunization due Comments: She agrees to get Prevnar-20 next month. She agrees to rto in Feb for a nurse visit.    Patient was given opportunity to ask questions. Patient verbalized understanding of the plan and was able to repeat key elements of the plan. All questions were answered to their satisfaction.   I, [name], MD, have reviewed all documentation for this visit. The documentation on 02/05/21 for the exam, diagnosis, procedures, and orders are all accurate and complete.   IF YOU HAVE BEEN REFERRED TO A SPECIALIST, IT MAY TAKE 1-2 WEEKS TO SCHEDULE/PROCESS THE REFERRAL. IF YOU HAVE NOT HEARD FROM US/SPECIALIST IN TWO WEEKS, PLEASE GIVE [place] A CALL AT [phone].   THE PATIENT IS ENCOURAGED TO PRACTICE SOCIAL DISTANCING DUE TO THE COVID-19 PANDEMIC.

## 2021-02-05 NOTE — Patient Instructions (Signed)

## 2021-02-06 LAB — BMP8+EGFR
BUN/Creatinine Ratio: 22 (ref 12–28)
BUN: 18 mg/dL (ref 8–27)
CO2: 24 mmol/L (ref 20–29)
Calcium: 9.4 mg/dL (ref 8.7–10.3)
Chloride: 106 mmol/L (ref 96–106)
Creatinine, Ser: 0.82 mg/dL (ref 0.57–1.00)
Glucose: 102 mg/dL — ABNORMAL HIGH (ref 70–99)
Potassium: 4.3 mmol/L (ref 3.5–5.2)
Sodium: 143 mmol/L (ref 134–144)
eGFR: 78 mL/min/{1.73_m2} (ref >59)

## 2021-02-06 LAB — HEMOGLOBIN A1C
Est. average glucose Bld gHb Est-mCnc: 123 mg/dL
Hgb A1c MFr Bld: 5.9 % — ABNORMAL HIGH (ref 4.8–5.6)

## 2021-02-18 ENCOUNTER — Other Ambulatory Visit: Payer: Self-pay

## 2021-02-18 ENCOUNTER — Ambulatory Visit (INDEPENDENT_AMBULATORY_CARE_PROVIDER_SITE_OTHER): Payer: Medicare Other

## 2021-02-18 VITALS — BP 118/78 | HR 76 | Temp 98.0°F | Ht 61.0 in | Wt 187.6 lb

## 2021-02-18 DIAGNOSIS — Z23 Encounter for immunization: Secondary | ICD-10-CM | POA: Diagnosis not present

## 2021-02-18 NOTE — Patient Instructions (Signed)
?  Pneumococcal Conjugate Vaccine (Prevnar 20) Suspension for Injection ?What is this medication? ?PNEUMOCOCCAL VACCINE (NEU mo KOK al vak SEEN) is a vaccine. It prevents pneumococcus bacterial infections. These bacteria can cause serious infections like pneumonia, meningitis, and blood infections. This vaccine will not treat an infection and will not cause infection. This vaccine is recommended for adults 18 years and older. ?This medicine may be used for other purposes; ask your health care provider or pharmacist if you have questions. ?COMMON BRAND NAME(S): Prevnar 20 ?What should I tell my care team before I take this medication? ?They need to know if you have any of these conditions: ?bleeding disorder ?fever ?immune system problems ?an unusual or allergic reaction to pneumococcal vaccine, diphtheria toxoid, other vaccines, other medicines, foods, dyes, or preservatives ?pregnant or trying to get pregnant ?breast-feeding ?How should I use this medication? ?This vaccine is injected into a muscle. It is given by a health care provider. ?A copy of Vaccine Information Statements will be given before each vaccination. Be sure to read this information carefully each time. This sheet may change often. ?Talk to your health care provider about the use of this medicine in children. Special care may be needed. ?Overdosage: If you think you have taken too much of this medicine contact a poison control center or emergency room at once. ?NOTE: This medicine is only for you. Do not share this medicine with others. ?What if I miss a dose? ?This does not apply. This medicine is not for regular use. ?What may interact with this medication? ?medicines for cancer chemotherapy ?medicines that suppress your immune function ?steroid medicines like prednisone or cortisone ?This list may not describe all possible interactions. Give your health care provider a list of all the medicines, herbs, non-prescription drugs, or dietary  supplements you use. Also tell them if you smoke, drink alcohol, or use illegal drugs. Some items may interact with your medicine. ?What should I watch for while using this medication? ?Mild fever and pain should go away in 3 days or less. Report any unusual symptoms to your health care provider. ?What side effects may I notice from receiving this medication? ?Side effects that you should report to your doctor or health care professional as soon as possible: ?allergic reactions (skin rash, itching or hives; swelling of the face, lips, or tongue) ?confusion ?fast, irregular heartbeat ?fever over 102 degrees F ?muscle weakness ?seizures ?trouble breathing ?unusual bruising or bleeding ?Side effects that usually do not require medical attention (report to your doctor or health care professional if they continue or are bothersome): ?fever of 102 degrees F or less ?headache ?joint pain ?muscle cramps, pain ?pain, tender at site where injected ?This list may not describe all possible side effects. Call your doctor for medical advice about side effects. You may report side effects to FDA at 1-800-FDA-1088. ?Where should I keep my medication? ?This vaccine is only given by a health care provider. It will not be stored at home. ?NOTE: This sheet is a summary. It may not cover all possible information. If you have questions about this medicine, talk to your doctor, pharmacist, or health care provider. ?? 2022 Elsevier/Gold Standard (2019-09-21 00:00:00) ? ?

## 2021-02-18 NOTE — Progress Notes (Signed)
Pt presents today for prevnar 20.  ?

## 2021-02-26 ENCOUNTER — Ambulatory Visit: Payer: Medicare Other

## 2021-02-28 ENCOUNTER — Telehealth: Payer: Self-pay

## 2021-02-28 NOTE — Chronic Care Management (AMB) (Signed)
Chronic Care Management Pharmacy Assistant   Name: Whitney Sanchez  MRN: [id number] DOB: Sep 14, 1955  Reason for Encounter: Disease State/ General  Recent office visits:  02-18-2021 Whitney Sanchez, CMA. Prevnar injection given.  02-05-2021 Whitney Peng, MD. Glucose= 102. A1C= 5.9.   01-14-2021 Covid booster given  12-31-2020 Whitney Sanchez, CMA. Flu vaccine given.  Recent consult visits:  None  Hospital visits:  None in previous 6 months  Medications: Outpatient Encounter Medications as of 02/28/2021  Medication Sig   cyclobenzaprine (FLEXERIL) 5 MG tablet One tab po qhs prn (Patient not taking: Reported on 02/18/2021)   DULoxetine (CYMBALTA) 20 MG capsule TAKE 1 CAPSULE BY MOUTH EVERY DAY   losartan (COZAAR) 100 MG tablet TAKE 1 TABLET BY MOUTH  DAILY   Pitavastatin Calcium (LIVALO) 4 MG TABS Take 1 tablet (4 mg total) by mouth daily.   Semaglutide, 1 MG/DOSE, (OZEMPIC, 1 MG/DOSE,) 4 MG/3ML SOPN DIAL AND INJECT UNDER THE SKIN 1 MG WEEKLY   No facility-administered encounter medications on file as of 02/28/2021.  Recent Relevant Labs: Lab Results  Component Value Date/Time   HGBA1C 5.9 (H) 02/05/2021 11:28 AM   HGBA1C 5.9 (H) 10/31/2020 11:57 AM   HGBA1C 6.4 08/16/2017 12:00 AM   MICROALBUR 10 10/10/2018 04:56 PM    Kidney Function Lab Results  Component Value Date/Time   CREATININE 0.82 02/05/2021 11:28 AM   CREATININE 0.87 10/31/2020 11:57 AM   GFRNONAA 70 02/23/2020 11:33 AM   GFRAA 80 02/23/2020 11:33 AM  Reviewed chart prior to disease state call. Spoke with patient regarding BP  Recent Office Vitals: BP Readings from Last 3 Encounters:  02/18/21 118/78  02/05/21 118/80  12/31/20 136/88   Pulse Readings from Last 3 Encounters:  02/18/21 76  02/05/21 90  12/31/20 94    Wt Readings from Last 3 Encounters:  02/18/21 187 lb 9.6 oz (85.1 kg)  02/05/21 182 lb 6.4 oz (82.7 kg)  12/31/20 184 lb 9.6 oz (83.7 kg)     Kidney Function Lab  Results  Component Value Date/Time   CREATININE 0.82 02/05/2021 11:28 AM   CREATININE 0.87 10/31/2020 11:57 AM   GFRNONAA 70 02/23/2020 11:33 AM   GFRAA 80 02/23/2020 11:33 AM    BMP Latest Ref Rng & Units 02/05/2021 10/31/2020 06/24/2020  Glucose 70 - 99 mg/dL 867(Y) 87 86  BUN 8 - 27 mg/dL 18 18 15   Creatinine 0.57 - 1.00 mg/dL 1.95 0.93  BUN/Creat Ratio 12 - 28 22 21 18   Sodium 134 - 144 mmol/L 143 141 140  Potassium 3.5 - 5.2 mmol/L 4.3 4.4 4.2  Chloride 96 - 106 mmol/L 106 105 103  CO2 20 - 29 mmol/L 24 22 22   Calcium 8.7 - 10.3 mg/dL 9.4 9.5 9.7    Current antihypertensive regimen:  Losartan 100 mg daily  How often are you checking your Blood Pressure? infrequently  Current home BP readings: Patient states she rarely checks blood pressure and cant recall the last reading. Last office visit 02-18-2021 118/78.  What recent interventions/DTPs have been made by any provider to improve Blood Pressure control since last CPP Visit:  Educated on Daily salt intake goal < 2300 mg; Exercise goal of 150 minutes per week; -Counseled to monitor BP at home at least three times per week, document, and provide log at future appointments -Counseled on diet and exercise extensively  Any recent hospitalizations or ED visits since last visit with CPP? No  What diet changes have been  made to improve Blood Pressure Control?  Patient states she has limited her salt intake and drinks plenty of water.  What exercise is being done to improve your Blood Pressure Control?  Patient states she cleans around the house and walks outside some days.  Adherence Review: Is the patient currently on ACE/ARB medication? Yes Does the patient have >5 day gap between last estimated fill dates? No   Current antihyperglycemic regimen:  Ozempic 1 mg weekly  What recent interventions/DTPs have been made to improve glycemic control:  Educated on A1c and blood sugar goals; Carbohydrate counting and/or  plate method -Counseled to check feet daily and get yearly eye exams -Counseled on diet and exercise extensively  Have there been any recent hospitalizations or ED visits since last visit with CPP? No  Patient denies hypoglycemic symptoms  Patient denies hyperglycemic symptoms  How often are you checking your blood sugar? once daily  What are your blood sugars ranging?  Fasting: 138 Before meals: None After meals: None Bedtime: None  During the week, how often does your blood glucose drop below 70? Never  Are you checking your feet daily/regularly? Patient stated daily  Adherence Review: Is the patient currently on a STATIN medication? Yes Is the patient currently on ACE/ARB medication? Yes Does the patient have >5 day gap between last estimated fill dates? No  Care Gaps: Shingrix overdue Foot exam overdue AWV 03-06-2021  Star Rating Drugs: Ozempic 1 mg- Patient assistance Losartan 100 mg- Last filled 01-13-2021 90 DS  Livalo 4 mg- Patient assistance  [name] [facility] Clinical Pharmacist Assistant [phone]

## 2021-03-06 ENCOUNTER — Ambulatory Visit (INDEPENDENT_AMBULATORY_CARE_PROVIDER_SITE_OTHER): Payer: Medicare PPO

## 2021-03-06 VITALS — Ht 64.0 in | Wt 187.0 lb

## 2021-03-06 DIAGNOSIS — Z0000 Encounter for general adult medical examination without abnormal findings: Secondary | ICD-10-CM | POA: Diagnosis not present

## 2021-03-06 NOTE — Patient Instructions (Signed)
Whitney Sanchez , Thank you for taking time to come for your Medicare Wellness Visit. I appreciate your ongoing commitment to your health goals. Please review the following plan we discussed and let me know if I can assist you in the future.   Screening recommendations/referrals: Colonoscopy: completed 07/19/2014, due 07/18/2024 Mammogram: completed 07/03/2020, due 07/04/2021 Bone Density: completed 10/13/2016 Recommended yearly ophthalmology/optometry visit for glaucoma screening and checkup Recommended yearly dental visit for hygiene and checkup  Vaccinations: Influenza vaccine: completed 12/31/2020, due next flu season Pneumococcal vaccine: completed 02/18/2021 Tdap vaccine: completed 04/03/2014, due 04/02/2024 Shingles vaccine: discussed   Covid-19: 01/14/2021, 11/17/2019, 02/18/2019, 01/21/2019  Advanced directives: Advance directive discussed with you today.   Conditions/risks identified: none  Next appointment: Follow up in one year for your annual wellness visit    Preventive Care 65 Years and Older, Female Preventive care refers to lifestyle choices and visits with your health care provider that can promote health and wellness. What does preventive care include? A yearly physical exam. This is also called an annual well check. Dental exams once or twice a year. Routine eye exams. Ask your health care provider how often you should have your eyes checked. Personal lifestyle choices, including: Daily care of your teeth and gums. Regular physical activity. Eating a healthy diet. Avoiding tobacco and drug use. Limiting alcohol use. Practicing safe sex. Taking low-dose aspirin every day. Taking vitamin and mineral supplements as recommended by your health care provider. What happens during an annual well check? The services and screenings done by your health care provider during your annual well check will depend on your age, overall health, lifestyle risk factors, and family history of  disease. Counseling  Your health care provider may ask you questions about your: Alcohol use. Tobacco use. Drug use. Emotional well-being. Home and relationship well-being. Sexual activity. Eating habits. History of falls. Memory and ability to understand (cognition). Work and work Astronomer. Reproductive health. Screening  You may have the following tests or measurements: Height, weight, and BMI. Blood pressure. Lipid and cholesterol levels. These may be checked every 5 years, or more frequently if you are over 59 years old. Skin check. Lung cancer screening. You may have this screening every year starting at age 70 if you have a 30-pack-year history of smoking and currently smoke or have quit within the past 15 years. Fecal occult blood test (FOBT) of the stool. You may have this test every year starting at age 9. Flexible sigmoidoscopy or colonoscopy. You may have a sigmoidoscopy every 5 years or a colonoscopy every 10 years starting at age 68. Hepatitis C blood test. Hepatitis B blood test. Sexually transmitted disease (STD) testing. Diabetes screening. This is done by checking your blood sugar (glucose) after you have not eaten for a while (fasting). You may have this done every 1-3 years. Bone density scan. This is done to screen for osteoporosis. You may have this done starting at age 69. Mammogram. This may be done every 1-2 years. Talk to your health care provider about how often you should have regular mammograms. Talk with your health care provider about your test results, treatment options, and if necessary, the need for more tests. Vaccines  Your health care provider may recommend certain vaccines, such as: Influenza vaccine. This is recommended every year. Tetanus, diphtheria, and acellular pertussis (Tdap, Td) vaccine. You may need a Td booster every 10 years. Zoster vaccine. You may need this after age 84. Pneumococcal 13-valent conjugate (PCV13) vaccine. One  dose is  recommended after age 61. Pneumococcal polysaccharide (PPSV23) vaccine. One dose is recommended after age 44. Talk to your health care provider about which screenings and vaccines you need and how often you need them. This information is not intended to replace advice given to you by your health care provider. Make sure you discuss any questions you have with your health care provider. Document Released: 02/01/2015 Document Revised: 09/25/2015 Document Reviewed: 11/06/2014 Elsevier Interactive Patient Education  2017 [employer] Prevention in the Home Falls can cause injuries. They can happen to people of all ages. There are many things you can do to make your home safe and to help prevent falls. What can I do on the outside of my home? Regularly fix the edges of walkways and driveways and fix any cracks. Remove anything that might make you trip as you walk through a door, such as a raised step or threshold. Trim any bushes or trees on the path to your home. Use bright outdoor lighting. Clear any walking paths of anything that might make someone trip, such as rocks or tools. Regularly check to see if handrails are loose or broken. Make sure that both sides of any steps have handrails. Any raised decks and porches should have guardrails on the edges. Have any leaves, snow, or ice cleared regularly. Use sand or salt on walking paths during winter. Clean up any spills in your garage right away. This includes oil or grease spills. What can I do in the bathroom? Use night lights. Install grab bars by the toilet and in the tub and shower. Do not use towel bars as grab bars. Use non-skid mats or decals in the tub or shower. If you need to sit down in the shower, use a plastic, non-slip stool. Keep the floor dry. Clean up any water that spills on the floor as soon as it happens. Remove soap buildup in the tub or shower regularly. Attach bath mats securely with double-sided  non-slip rug tape. Do not have throw rugs and other things on the floor that can make you trip. What can I do in the bedroom? Use night lights. Make sure that you have a light by your bed that is easy to reach. Do not use any sheets or blankets that are too big for your bed. They should not hang down onto the floor. Have a firm chair that has side arms. You can use this for support while you get dressed. Do not have throw rugs and other things on the floor that can make you trip. What can I do in the kitchen? Clean up any spills right away. Avoid walking on wet floors. Keep items that you use a lot in easy-to-reach places. If you need to reach something above you, use a strong step stool that has a grab bar. Keep electrical cords out of the way. Do not use floor polish or wax that makes floors slippery. If you must use wax, use non-skid floor wax. Do not have throw rugs and other things on the floor that can make you trip. What can I do with my stairs? Do not leave any items on the stairs. Make sure that there are handrails on both sides of the stairs and use them. Fix handrails that are broken or loose. Make sure that handrails are as long as the stairways. Check any carpeting to make sure that it is firmly attached to the stairs. Fix any carpet that is loose or worn. Avoid having throw  rugs at the top or bottom of the stairs. If you do have throw rugs, attach them to the floor with carpet tape. Make sure that you have a light switch at the top of the stairs and the bottom of the stairs. If you do not have them, ask someone to add them for you. What else can I do to help prevent falls? Wear shoes that: Do not have high heels. Have rubber bottoms. Are comfortable and fit you well. Are closed at the toe. Do not wear sandals. If you use a stepladder: Make sure that it is fully opened. Do not climb a closed stepladder. Make sure that both sides of the stepladder are locked into place. Ask  someone to hold it for you, if possible. Clearly mark and make sure that you can see: Any grab bars or handrails. First and last steps. Where the edge of each step is. Use tools that help you move around (mobility aids) if they are needed. These include: Canes. Walkers. Scooters. Crutches. Turn on the lights when you go into a dark area. Replace any light bulbs as soon as they burn out. Set up your furniture so you have a clear path. Avoid moving your furniture around. If any of your floors are uneven, fix them. If there are any pets around you, be aware of where they are. Review your medicines with your doctor. Some medicines can make you feel dizzy. This can increase your chance of falling. Ask your doctor what other things that you can do to help prevent falls. This information is not intended to replace advice given to you by your health care provider. Make sure you discuss any questions you have with your health care provider. Document Released: 11/01/2008 Document Revised: 06/13/2015 Document Reviewed: 02/09/2014 Elsevier Interactive Patient Education  2017 [employer].

## 2021-03-06 NOTE — Progress Notes (Signed)
I connected with Whitney Sanchez today by telephone and verified that I am speaking with the correct person using two identifiers. Location patient: home Location provider: work Persons participating in the virtual visit: Mariska Hottel, Glenna Durand LPN.   I discussed the limitations, risks, security and privacy concerns of performing an evaluation and management service by telephone and the availability of in person appointments. I also discussed with the patient that there may be a patient responsible charge related to this service. The patient expressed understanding and verbally consented to this telephonic visit.    Interactive audio and video telecommunications were attempted between this provider and patient, however failed, due to patient having technical difficulties OR patient did not have access to video capability.  We continued and completed visit with audio only.     Vital signs may be patient reported or missing.  Subjective:   Whitney Sanchez is a 66 y.o. female who presents for an Initial Medicare Annual Wellness Visit.  Review of Systems     Cardiac Risk Factors include: advanced age (>75men, >73 women);diabetes mellitus;hypertension;obesity (BMI >30kg/m2)     Objective:    Today's Vitals   03/06/21 1522 03/06/21 1523  Weight: 187 lb (84.8 kg)   Height: 5\' 4"  (1.626 m)   PainSc:  8    Body mass index is 32.1 kg/m.  Advanced Directives 03/06/2021 02/23/2020  Does Patient Have a Medical Advance Directive? No No    Current Medications (verified) Outpatient Encounter Medications as of 03/06/2021  Medication Sig   cyclobenzaprine (FLEXERIL) 5 MG tablet One tab po qhs prn   DULoxetine (CYMBALTA) 20 MG capsule TAKE 1 CAPSULE BY MOUTH EVERY DAY   losartan (COZAAR) 100 MG tablet TAKE 1 TABLET BY MOUTH  DAILY   Pitavastatin Calcium (LIVALO) 4 MG TABS Take 1 tablet (4 mg total) by mouth daily.   Semaglutide, 1 MG/DOSE, (OZEMPIC, 1 MG/DOSE,) 4 MG/3ML SOPN DIAL  AND INJECT UNDER THE SKIN 1 MG WEEKLY   No facility-administered encounter medications on file as of 03/06/2021.    Allergies (verified) Patient has no known allergies.   History: Past Medical History:  Diagnosis Date   DM (diabetes mellitus) ([place])    High cholesterol    HTN (hypertension)    Past Surgical History:  Procedure Laterality Date   ABDOMINAL HYSTERECTOMY  1996   total   Family History  Problem Relation Age of Onset   Diabetes Mother    Healthy Father    Breast cancer Neg Hx    Neuropathy Neg Hx    Social History   Socioeconomic History   Marital status: Married    Spouse name: Not on file   Number of children: Not on file   Years of education: Not on file   Highest education level: Not on file  Occupational History   Not on file  Tobacco Use   Smoking status: Never    Passive exposure: Never   Smokeless tobacco: Never  Vaping Use   Vaping Use: Never used  Substance and Sexual Activity   Alcohol use: Never   Drug use: Never   Sexual activity: Not on file  Other Topics Concern   Not on file  Social History Narrative   Not on file   Social Determinants of Health   Financial Resource Strain: Low Risk    Difficulty of Paying Living Expenses: Not hard at all  Food Insecurity: No Food Insecurity   Worried About Running Out of Food in the Last  Year: Never true   [place] in the Last Year: Never true  Transportation Needs: No Transportation Needs   Lack of Transportation (Medical): No   Lack of Transportation (Non-Medical): No  Physical Activity: Insufficiently Active   Days of Exercise per Week: 1 day   Minutes of Exercise per Session: 30 min  Stress: No Stress Concern Present   Feeling of Stress : Only a little  Social Connections: Not on file    Tobacco Counseling Counseling given: Not Answered   Clinical Intake:  Pre-visit preparation completed: Yes  Pain : 0-10 Pain Score: 8  Pain Type: Chronic pain Pain Location:  Wrist Pain Orientation: Left Pain Descriptors / Indicators: Throbbing Pain Onset: More than a month ago Pain Frequency: Constant     Nutritional Status: BMI > 30  Obese Nutritional Risks: None Diabetes: Yes  How often do you need to have someone help you when you read instructions, pamphlets, or other written materials from your doctor or pharmacy?: 1 - Never What is the last grade level you completed in school?: 12th grade  Diabetic? Yes Nutrition Risk Assessment:  Has the patient had any N/V/D within the last 2 months?  No  Does the patient have any non-healing wounds?  No  Has the patient had any unintentional weight loss or weight gain?  No   Diabetes:  Is the patient diabetic?  Yes  If diabetic, was a CBG obtained today?  No  Did the patient bring in their glucometer from home?  No  How often do you monitor your CBG's? Once every one to two weeks.   Financial Strains and Diabetes Management:  Are you having any financial strains with the device, your supplies or your medication? No .  Does the patient want to be seen by Chronic Care Management for management of their diabetes?  No  Would the patient like to be referred to a Nutritionist or for Diabetic Management?  No   Diabetic Exams:  Diabetic Eye Exam: Completed 05/29/2020 Diabetic Foot Exam: Completed 02/23/2020   Interpreter Needed?: No  Information entered by :: NAllen LPN   Activities of Daily Living In your present state of health, do you have any difficulty performing the following activities: 03/06/2021  Hearing? N  Vision? N  Difficulty concentrating or making decisions? N  Walking or climbing stairs? N  Dressing or bathing? N  Doing errands, shopping? N  Preparing Food and eating ? N  Using the Toilet? N  In the past six months, have you accidently leaked urine? N  Do you have problems with loss of bowel control? N  Managing your Medications? N  Managing your Finances? N  Some recent data might  be hidden    Patient Care Team: Glendale Chard, MD as PCP - General (Internal Medicine) Mayford Knife, [facility] (Pharmacist)  Indicate any recent Medical Services you may have received from other than [facility] providers in the past year (date may be approximate).     Assessment:   This is a routine wellness examination for [place].  Hearing/Vision screen Vision Screening - Comments:: Regular eye exams, [facility]  Dietary issues and exercise activities discussed: Current Exercise Habits: Home exercise routine, Type of exercise: walking, Time (Minutes): 30, Frequency (Times/Week): 1, Weekly Exercise (Minutes/Week): 30   Goals Addressed             This Visit's Progress    Patient Stated       03/06/2021, no goals  Depression Screen PHQ 2/9 Scores 03/06/2021 10/31/2020 10/18/2019 10/10/2018 06/20/2018 03/17/2018 12/15/2017  PHQ - 2 Score 0 0 0 0 0 0 0  PHQ- 9 Score - 0 - - - - -    Fall Risk Fall Risk  03/06/2021 02/23/2020 10/18/2019 10/10/2018 06/20/2018  Falls in the past year? 0 0 0 0 0  Risk for fall due to : Medication side effect - - - -  Follow up Falls evaluation completed;Education provided;Falls prevention discussed - - - -    FALL RISK PREVENTION PERTAINING TO THE HOME:  Any stairs in or around the home? No  If so, are there any without handrails?  N/a Home free of loose throw rugs in walkways, pet beds, electrical cords, etc? Yes  Adequate lighting in your home to reduce risk of falls? Yes   ASSISTIVE DEVICES UTILIZED TO PREVENT FALLS:  Life alert? No  Use of a cane, walker or w/c? No  Grab bars in the bathroom? No  Shower chair or bench in shower? No  Elevated toilet seat or a handicapped toilet? No   TIMED UP AND GO:  Was the test performed? No .      Cognitive Function:     6CIT Screen 03/06/2021 02/23/2020  What Year? 0 points 0 points  What month? 0 points 0 points  What time? 0 points 0 points  Count back from 20 0 points 0 points  Months in  reverse 0 points 0 points  Repeat phrase 2 points 0 points  Total Score 2 0    Immunizations Immunization History  Administered Date(s) Administered   Fluad Quad(high Dose 65+) 10/18/2019, 12/31/2020   Influenza-Unspecified 10/19/2017   Moderna Covid-19 Vaccine Bivalent Booster 69yrs & up 01/14/2021   Moderna SARS-COV2 Booster Vaccination 11/17/2019   Moderna Sars-Covid-2 Vaccination 01/21/2019, 02/18/2019, 01/14/2021   PNEUMOCOCCAL CONJUGATE-20 02/18/2021   Pneumococcal Polysaccharide-23 02/05/2015    TDAP status: Up to date  Flu Vaccine status: Up to date  Pneumococcal vaccine status: Up to date  Covid-19 vaccine status: Completed vaccines  Qualifies for Shingles Vaccine? Yes   Zostavax completed No   Shingrix Completed?: No.    Education has been provided regarding the importance of this vaccine. Patient has been advised to call insurance company to determine out of pocket expense if they have not yet received this vaccine. Advised may also receive vaccine at local pharmacy or Health Dept. Verbalized acceptance and understanding.  Screening Tests Health Maintenance  Topic Date Due   Zoster Vaccines- Shingrix (1 of 2) Never done   FOOT EXAM  02/22/2021   COVID-19 Vaccine (4 - Booster for Moderna series) 03/11/2021   OPHTHALMOLOGY EXAM  05/29/2021   HEMOGLOBIN A1C  08/05/2021   MAMMOGRAM  07/04/2022   TETANUS/TDAP  04/02/2024   COLONOSCOPY (Pts 45-85yrs Insurance coverage will need to be confirmed)  07/18/2024   Pneumonia Vaccine 27+ Years old  Completed   INFLUENZA VACCINE  Completed   DEXA SCAN  Completed   Hepatitis C Screening  Completed   HPV VACCINES  Aged Out    Health Maintenance  Health Maintenance Due  Topic Date Due   Zoster Vaccines- Shingrix (1 of 2) Never done   FOOT EXAM  02/22/2021    Colorectal cancer screening: Type of screening: Colonoscopy. Completed 07/19/2014. Repeat every 10 years  Mammogram status: Completed 07/03/2020. Repeat every  year  Bone Density status: Completed 10/13/2016.   Lung Cancer Screening: (Low Dose CT Chest recommended if Age 76-80 years, 30 pack-year  currently smoking OR have quit w/in 15years.) does not qualify.   Lung Cancer Screening Referral: no  Additional Screening:  Hepatitis C Screening: does qualify; Completed 11/10/2016  Vision Screening: Recommended annual ophthalmology exams for early detection of glaucoma and other disorders of the eye. Is the patient up to date with their annual eye exam?  Yes  Who is the provider or what is the name of the office in which the patient attends annual eye exams? [facility] If pt is not established with a provider, would they like to be referred to a provider to establish care? No .   Dental Screening: Recommended annual dental exams for proper oral hygiene  Community Resource Referral / Chronic Care Management: CRR required this visit?  No   CCM required this visit?  No      Plan:     I have personally reviewed and noted the following in the patients chart:   Medical and social history Use of alcohol, tobacco or illicit drugs  Current medications and supplements including opioid prescriptions. Patient is not currently taking opioid prescriptions. Functional ability and status Nutritional status Physical activity Advanced directives List of other physicians Hospitalizations, surgeries, and ER visits in previous 12 months Vitals Screenings to include cognitive, depression, and falls Referrals and appointments  In addition, I have reviewed and discussed with patient certain preventive protocols, quality metrics, and best practice recommendations. A written personalized care plan for preventive services as well as general preventive health recommendations were provided to patient.     Kellie Simmering, LPN   [id number]   Nurse Notes: none  Due to this being a virtual visit, the after visit summary with patients personalized plan was  offered to patient via mail or my-chart.  per request, patient was mailed a copy of AVS.

## 2021-04-02 ENCOUNTER — Other Ambulatory Visit: Payer: Self-pay

## 2021-04-02 ENCOUNTER — Telehealth: Payer: Self-pay

## 2021-04-02 MED ORDER — OZEMPIC (1 MG/DOSE) 4 MG/3ML ~~LOC~~ SOPN: PEN_INJECTOR | SUBCUTANEOUS | 2 refills | Status: DC

## 2021-04-02 NOTE — Chronic Care Management (AMB) (Signed)
? ? ?  Chronic Care Management ?Pharmacy Assistant  ? ?Name: Whitney Sanchez  MRN: [id number] DOB: 10/07/1955 ? ?Reason for Encounter: Patient Assistance Coordination ? ?04/02/2021- Patient has been approved for Ozempic with [employer] patient assistance program through 12/18/2021, medication will be shipped in 10-14 business days.  In process of being shipped, estimated delivery date and time not available. Called patient to inform, patient aware she was approved and that it may take up to 6 weeks for medication to arrive to PCP office. Requesting prescription [facility] sent to [facility], patient mentioned she has a new insurance plan this year, she now has [employer]. Patient will make sure to give [facility] insurance information to pharmacy once prescription has been sent in to determine the cost. Patient aware we will let her know as soon as Ozempic has arrived to the office.  ? ? ?Medications: ?Outpatient Encounter Medications as of 04/02/2021  ?Medication Sig  ? cyclobenzaprine (FLEXERIL) 5 MG tablet One tab po qhs prn  ? DULoxetine (CYMBALTA) 20 MG capsule TAKE 1 CAPSULE BY MOUTH EVERY DAY  ? losartan (COZAAR) 100 MG tablet TAKE 1 TABLET BY MOUTH  DAILY  ? Pitavastatin Calcium (LIVALO) 4 MG TABS Take 1 tablet (4 mg total) by mouth daily.  ? Semaglutide, 1 MG/DOSE, (OZEMPIC, 1 MG/DOSE,) 4 MG/3ML SOPN DIAL AND INJECT UNDER THE SKIN 1 MG WEEKLY  ? ?No facility-administered encounter medications on file as of 04/02/2021.  ? ?[name], CMA ?Clinical Pharmacist Assistant ?[phone] ? ?

## 2021-04-20 ENCOUNTER — Emergency Department (HOSPITAL_COMMUNITY): Payer: Medicare PPO

## 2021-04-20 ENCOUNTER — Other Ambulatory Visit: Payer: Self-pay

## 2021-04-20 ENCOUNTER — Encounter (HOSPITAL_COMMUNITY): Payer: Self-pay | Admitting: Emergency Medicine

## 2021-04-20 ENCOUNTER — Observation Stay (HOSPITAL_COMMUNITY): Payer: Medicare PPO

## 2021-04-20 ENCOUNTER — Observation Stay (HOSPITAL_COMMUNITY)
Admission: EM | Admit: 2021-04-20 | Discharge: 2021-04-21 | Disposition: A | Discharge status: Home / Self Care | Payer: Medicare PPO | Attending: Internal Medicine | Admitting: Internal Medicine

## 2021-04-20 DIAGNOSIS — E782 Mixed hyperlipidemia: Secondary | ICD-10-CM

## 2021-04-20 DIAGNOSIS — E119 Type 2 diabetes mellitus without complications: Secondary | ICD-10-CM

## 2021-04-20 DIAGNOSIS — R0789 Other chest pain: Secondary | ICD-10-CM

## 2021-04-20 DIAGNOSIS — K449 Diaphragmatic hernia without obstruction or gangrene: Secondary | ICD-10-CM | POA: Insufficient documentation

## 2021-04-20 DIAGNOSIS — R0602 Shortness of breath: Secondary | ICD-10-CM | POA: Diagnosis not present

## 2021-04-20 DIAGNOSIS — E7800 Pure hypercholesterolemia, unspecified: Secondary | ICD-10-CM | POA: Insufficient documentation

## 2021-04-20 DIAGNOSIS — I10 Essential (primary) hypertension: Secondary | ICD-10-CM | POA: Insufficient documentation

## 2021-04-20 DIAGNOSIS — R079 Chest pain, unspecified: Secondary | ICD-10-CM | POA: Diagnosis not present

## 2021-04-20 DIAGNOSIS — Z7985 Long-term (current) use of injectable non-insulin antidiabetic drugs: Secondary | ICD-10-CM | POA: Diagnosis not present

## 2021-04-20 DIAGNOSIS — Z7982 Long term (current) use of aspirin: Secondary | ICD-10-CM | POA: Insufficient documentation

## 2021-04-20 DIAGNOSIS — E1169 Type 2 diabetes mellitus with other specified complication: Secondary | ICD-10-CM | POA: Insufficient documentation

## 2021-04-20 DIAGNOSIS — I700 Atherosclerosis of aorta: Secondary | ICD-10-CM | POA: Diagnosis not present

## 2021-04-20 DIAGNOSIS — Z79899 Other long term (current) drug therapy: Secondary | ICD-10-CM | POA: Diagnosis not present

## 2021-04-20 DIAGNOSIS — I160 Hypertensive urgency: Secondary | ICD-10-CM | POA: Diagnosis not present

## 2021-04-20 DIAGNOSIS — E785 Hyperlipidemia, unspecified: Secondary | ICD-10-CM

## 2021-04-20 DIAGNOSIS — Z794 Long term (current) use of insulin: Secondary | ICD-10-CM | POA: Insufficient documentation

## 2021-04-20 DIAGNOSIS — R931 Abnormal findings on diagnostic imaging of heart and coronary circulation: Secondary | ICD-10-CM | POA: Diagnosis not present

## 2021-04-20 DIAGNOSIS — I2510 Atherosclerotic heart disease of native coronary artery without angina pectoris: Secondary | ICD-10-CM | POA: Insufficient documentation

## 2021-04-20 LAB — TROPONIN I (HIGH SENSITIVITY)
Troponin I (High Sensitivity): 30 ng/L — ABNORMAL HIGH (ref <18)
Troponin I (High Sensitivity): 37 ng/L — ABNORMAL HIGH (ref <18)

## 2021-04-20 LAB — BASIC METABOLIC PANEL
Anion gap: 10 (ref 5–15)
BUN: 12 mg/dL (ref 8–23)
CO2: 23 mmol/L (ref 22–32)
Calcium: 9.2 mg/dL (ref 8.9–10.3)
Chloride: 107 mmol/L (ref 98–111)
Creatinine, Ser: 0.86 mg/dL (ref 0.44–1.00)
GFR, Estimated: >60 mL/min (ref >60)
Glucose, Bld: 126 mg/dL — ABNORMAL HIGH (ref 70–99)
Potassium: 3.8 mmol/L (ref 3.5–5.1)
Sodium: 140 mmol/L (ref 135–145)

## 2021-04-20 LAB — CBG MONITORING, ED: Glucose-Capillary: 102 mg/dL — ABNORMAL HIGH (ref 70–99)

## 2021-04-20 LAB — GLUCOSE, CAPILLARY: Glucose-Capillary: 192 mg/dL — ABNORMAL HIGH (ref 70–99)

## 2021-04-20 LAB — CBC
HCT: 37 % (ref 36.0–46.0)
Hemoglobin: 12 g/dL (ref 12.0–15.0)
MCH: 28.8 pg (ref 26.0–34.0)
MCHC: 32.4 g/dL (ref 30.0–36.0)
MCV: 88.7 fL (ref 80.0–100.0)
Platelets: 290 10*3/uL (ref 150–400)
RBC: 4.17 MIL/uL (ref 3.87–5.11)
RDW: 13.3 % (ref 11.5–15.5)
WBC: 6.8 10*3/uL (ref 4.0–10.5)
nRBC: 0 % (ref 0.0–0.2)

## 2021-04-20 MED ORDER — IOHEXOL 350 MG/ML SOLN
100.0000 mL | Freq: Once | INTRAVENOUS | Status: AC | PRN
  Administered 2021-04-20: 100 mL via INTRAVENOUS

## 2021-04-20 MED ORDER — LOSARTAN POTASSIUM 50 MG PO TABS
100.0000 mg | ORAL_TABLET | Freq: Every day | ORAL | Status: DC
  Administered 2021-04-21: 100 mg via ORAL
  Filled 2021-04-20: qty 2

## 2021-04-20 MED ORDER — CYCLOBENZAPRINE HCL 5 MG PO TABS: 5.0000 mg | ORAL_TABLET | Freq: Every evening | ORAL | Status: DC | PRN

## 2021-04-20 MED ORDER — ONDANSETRON HCL 4 MG/2ML IJ SOLN: 4.0000 mg | Freq: Four times a day (QID) | INTRAMUSCULAR | Status: DC | PRN

## 2021-04-20 MED ORDER — SODIUM CHLORIDE 0.9% FLUSH: 3.0000 mL | INTRAVENOUS | Status: DC | PRN

## 2021-04-20 MED ORDER — INSULIN ASPART 100 UNIT/ML IJ SOLN: 0.0000 [IU] | Freq: Three times a day (TID) | INTRAMUSCULAR | Status: DC

## 2021-04-20 MED ORDER — ACETAMINOPHEN 325 MG PO TABS
650.0000 mg | ORAL_TABLET | ORAL | Status: DC | PRN
  Administered 2021-04-20 – 2021-04-21 (×3): 650 mg via ORAL
  Filled 2021-04-20 (×3): qty 2

## 2021-04-20 MED ORDER — NITROGLYCERIN 0.4 MG SL SUBL: 0.4000 mg | SUBLINGUAL_TABLET | SUBLINGUAL | Status: DC | PRN

## 2021-04-20 MED ORDER — CARVEDILOL 6.25 MG PO TABS
6.2500 mg | ORAL_TABLET | Freq: Two times a day (BID) | ORAL | Status: DC
  Administered 2021-04-20 – 2021-04-21 (×2): 6.25 mg via ORAL
  Filled 2021-04-20: qty 1
  Filled 2021-04-20: qty 2

## 2021-04-20 MED ORDER — ASPIRIN EC 81 MG PO TBEC
81.0000 mg | DELAYED_RELEASE_TABLET | Freq: Every day | ORAL | Status: DC
  Administered 2021-04-21: 81 mg via ORAL
  Filled 2021-04-20: qty 1

## 2021-04-20 MED ORDER — METOPROLOL TARTRATE 25 MG PO TABS
25.0000 mg | ORAL_TABLET | Freq: Once | ORAL | Status: AC
  Administered 2021-04-20: 25 mg via ORAL
  Filled 2021-04-20: qty 1

## 2021-04-20 MED ORDER — ASPIRIN 325 MG PO TABS
325.0000 mg | ORAL_TABLET | Freq: Every day | ORAL | Status: DC
  Administered 2021-04-20: 325 mg via ORAL
  Filled 2021-04-20: qty 1

## 2021-04-20 MED ORDER — SODIUM CHLORIDE 0.9 % IV SOLN
250.0000 mL | INTRAVENOUS | Status: DC | PRN
Start: 2021-04-20 — End: 2021-04-21

## 2021-04-20 MED ORDER — SODIUM CHLORIDE 0.9% FLUSH
3.0000 mL | Freq: Two times a day (BID) | INTRAVENOUS | Status: DC
  Administered 2021-04-20 – 2021-04-21 (×2): 3 mL via INTRAVENOUS

## 2021-04-20 MED ORDER — DULOXETINE HCL 20 MG PO CPEP
20.0000 mg | ORAL_CAPSULE | Freq: Every day | ORAL | Status: DC
  Administered 2021-04-21: 20 mg via ORAL
  Filled 2021-04-20 (×2): qty 1

## 2021-04-20 MED ORDER — HEPARIN SODIUM (PORCINE) 5000 UNIT/ML IJ SOLN
5000.0000 [IU] | Freq: Three times a day (TID) | INTRAMUSCULAR | Status: DC
  Administered 2021-04-20 – 2021-04-21 (×2): 5000 [IU] via SUBCUTANEOUS
  Filled 2021-04-20 (×3): qty 1

## 2021-04-20 MED ORDER — NITROGLYCERIN 0.4 MG SL SUBL
0.8000 mg | SUBLINGUAL_TABLET | SUBLINGUAL | Status: DC | PRN
  Filled 2021-04-20: qty 2

## 2021-04-20 MED ORDER — ISOSORBIDE MONONITRATE ER 30 MG PO TB24
30.0000 mg | ORAL_TABLET | Freq: Every day | ORAL | Status: DC
  Administered 2021-04-20 – 2021-04-21 (×2): 30 mg via ORAL
  Filled 2021-04-20 (×2): qty 1

## 2021-04-20 MED ORDER — ROSUVASTATIN CALCIUM 20 MG PO TABS
20.0000 mg | ORAL_TABLET | Freq: Every day | ORAL | Status: DC
  Administered 2021-04-20 – 2021-04-21 (×2): 20 mg via ORAL
  Filled 2021-04-20 (×2): qty 1

## 2021-04-20 NOTE — H&P (Addendum)
Please see Merrilyn Puma consult note which serves as this patient's H&P. ?CT suggestive of moderate Cx disease, FFR pending, no aortic dissection. ?Admit orders for observation written per discussion with Dr. Izora Ribas to include addition of carvedilol 6.25mg  BID, Imdur 30mg  daily, continue ASA at low dose 81mg  daily, continue home losartan, change pitavastatin to rosuvastatin, order echo, hold off full dose heparin, and plan NPO after midnight to review FFR results tomorrow. Will hold Ozempic and add SSI. ED team updated of plan. Dr. will be down to speak with patient about results as well. ? ?Patient asymptomatic and doing well.  Will improve BP and review FFR.  OK to eat.  Potential 04/21/21 discharge based off of FFR results and sx.  Full note to follow. ?

## 2021-04-20 NOTE — ED Triage Notes (Signed)
For the past two weeks pt has had substernal chest pressure that goes to her back and left shoulder blade.  Pt reports SOB and dizziness at times.   ?

## 2021-04-20 NOTE — ED Provider Notes (Signed)
?[facility] ?Provider Note ? ? ?CSN: [id number] ?Arrival date & time: 04/20/21  [id number] ? ?  ? ?History ? ?Chief Complaint  ?Patient presents with  ? Chest Pain  ? Back Pain  ? ? ?Whitney Sanchez is a 66 y.o. female who  has a past medical history of DM (diabetes mellitus) ([place]), High cholesterol, and HTN (hypertension). Presents with a cc of cp. The patient reports intermittent, unprovoked chest pain radiating to her back.  She states that nothing seems to make her pain worse or better.  It is not exertional.  She states that last night her pain was so severe that she had to pull the car over.  It lasts 10 to 15 minutes at a time.  She has associated nausea and has vomited several times during these incidents.  She told her husband about the incident last night and he was concerned.  He states that this morning he saw her sitting on the edge of the couch clutching her chest bending over and moaning and rocking back and forth.  He grabbed her shoes told her to put them on and told her she had to go to the emergency department.  Patient has no smoking history.  She denies a primary family history of MI.  She denies any unilateral leg swelling, pleuritic chest pain, racing or skipping in her heart, palpitations or near syncope. ? ? ?Chest Pain ?Associated symptoms: back pain   ?Back Pain ?Associated symptoms: chest pain   ? ?  ? ?Home Medications ?Prior to Admission medications   ?Medication Sig Start Date End Date Taking? Authorizing Provider  ?cyclobenzaprine (FLEXERIL) 5 MG tablet One tab po qhs prn ?Patient taking differently: Take 5 mg by mouth at bedtime as needed for muscle spasms. 10/18/19  Yes Glendale Chard, MD  ?DULoxetine (CYMBALTA) 20 MG capsule TAKE 1 CAPSULE BY MOUTH EVERY DAY ?Patient taking differently: Take 20 mg by mouth daily. 01/14/21  Yes Glendale Chard, MD  ?losartan (COZAAR) 100 MG tablet TAKE 1 TABLET BY MOUTH  DAILY ?Patient taking differently: Take 100 mg  by mouth daily. 12/23/20  Yes Glendale Chard, MD  ?Semaglutide, 1 MG/DOSE, (OZEMPIC, 1 MG/DOSE,) 4 MG/3ML SOPN DIAL AND INJECT UNDER THE SKIN 1 MG WEEKLY ?Patient taking differently: Inject 1 mg into the skin once a week. 04/02/21  Yes Glendale Chard, MD  ?aspirin EC 81 MG EC tablet Take 1 tablet (81 mg total) by mouth daily. Swallow whole. 04/22/21   Ledora Bottcher, PA  ?carvedilol (COREG) 6.25 MG tablet Take 1 tablet (6.25 mg total) by mouth 2 (two) times daily with a meal. 04/21/21   [facility], Tami Lin, PA  ?isosorbide mononitrate (IMDUR) 30 MG 24 hr tablet Take 1 tablet (30 mg total) by mouth daily. 04/22/21   [facility], Tami Lin, PA  ?nitroGLYCERIN (NITROSTAT) 0.4 MG SL tablet Place 1 tablet (0.4 mg total) under the tongue every 5 (five) minutes x 3 doses as needed for chest pain. 04/21/21   Ledora Bottcher, PA  ?rosuvastatin (CRESTOR) 20 MG tablet Take 1 tablet (20 mg total) by mouth daily. 04/22/21   Ledora Bottcher, PA  ?   ? ?Allergies    ?Patient has no known allergies.   ? ?Review of Systems   ?Review of Systems  ?Cardiovascular:  Positive for chest pain.  ?Musculoskeletal:  Positive for back pain.  ? ?Physical Exam ?Updated Vital Signs ?BP 134/84 (BP Location: Left Wrist)   Pulse 73  Temp 98.4 ?F (36.9 ?C) (Oral)   Resp 17   Ht 5\' 4"  (1.626 m)   Wt 81.9 kg   SpO2 98%   BMI 30.98 kg/m?  ?Physical Exam ?Vitals and nursing note reviewed.  ?Constitutional:   ?   General: She is not in acute distress. ?   Appearance: She is well-developed. She is not diaphoretic.  ?HENT:  ?   Head: Normocephalic and atraumatic.  ?   Right Ear: External ear normal.  ?   Left Ear: External ear normal.  ?   Nose: Nose normal.  ?   Mouth/Throat:  ?   Mouth: Mucous membranes are moist.  ?Eyes:  ?   General: No scleral icterus. ?   Conjunctiva/sclera: Conjunctivae normal.  ?Cardiovascular:  ?   Rate and Rhythm: Normal rate and regular rhythm.  ?   Heart sounds: Normal heart sounds. No murmur heard. ?  No friction rub. No  gallop.  ?Pulmonary:  ?   Effort: Pulmonary effort is normal. No respiratory distress.  ?   Breath sounds: Normal breath sounds.  ?Abdominal:  ?   General: Bowel sounds are normal. There is no distension.  ?   Palpations: Abdomen is soft. There is no mass.  ?   Tenderness: There is no abdominal tenderness. There is no guarding.  ?Musculoskeletal:  ?   Cervical back: Normal range of motion.  ?Skin: ?   General: Skin is warm and dry.  ?Neurological:  ?   Mental Status: She is alert and oriented to person, place, and time.  ?Psychiatric:     ?   Behavior: Behavior normal.  ? ? ?ED Results / Procedures / Treatments   ?Labs ?(all labs ordered are listed, but only abnormal results are displayed) ?Labs Reviewed  ?BASIC METABOLIC PANEL - Abnormal; Notable for the following components:  ?    Result Value  ? Glucose, Bld 126 (*)   ? All other components within normal limits  ?CBC - Abnormal; Notable for the following components:  ? RBC 3.75 (*)   ? Hemoglobin 10.8 (*)   ? HCT 33.5 (*)   ? All other components within normal limits  ?LIPID PANEL - Abnormal; Notable for the following components:  ? Triglycerides 225 (*)   ? VLDL 45 (*)   ? All other components within normal limits  ?BASIC METABOLIC PANEL - Abnormal; Notable for the following components:  ? Sodium 133 (*)   ? CO2 18 (*)   ? Calcium 8.5 (*)   ? All other components within normal limits  ?HEPATIC FUNCTION PANEL - Abnormal; Notable for the following components:  ? Total Protein 6.1 (*)   ? Albumin 3.4 (*)   ? All other components within normal limits  ?GLUCOSE, CAPILLARY - Abnormal; Notable for the following components:  ? Glucose-Capillary 192 (*)   ? All other components within normal limits  ?GLUCOSE, CAPILLARY - Abnormal; Notable for the following components:  ? Glucose-Capillary 117 (*)   ? All other components within normal limits  ?CBG MONITORING, ED - Abnormal; Notable for the following components:  ? Glucose-Capillary 102 (*)   ? All other components within  normal limits  ?TROPONIN I (HIGH SENSITIVITY) - Abnormal; Notable for the following components:  ? Troponin I (High Sensitivity) 30 (*)   ? All other components within normal limits  ?TROPONIN I (HIGH SENSITIVITY) - Abnormal; Notable for the following components:  ? Troponin I (High Sensitivity) 37 (*)   ? All other  components within normal limits  ?CBC  ?HIV ANTIBODY (ROUTINE TESTING W REFLEX)  ? ? ?EKG ?EKG Interpretation ? ?Date/Time:  Sunday April 20 2021 06:59:01 EDT ?Ventricular Rate:  96 ?PR Interval:  138 ?QRS Duration: 76 ?QT Interval:  374 ?QTC Calculation: 472 ?R Axis:   81 ?Text Interpretation: Sinus rhythm with occasional Premature ventricular complexes T wave abnormality, consider lateral ischemia Prolonged QT Abnormal ECG No previous ECGs available Nonspecific ST and T wave abnormality Confirmed by Varney Biles [phone]) on 04/20/2021 8:23:18 AM ? ?Radiology ?CT CORONARY MORPH W/CTA COR W/SCORE W/CA W/CM &/OR WO/CM ? ?Addendum Date: 04/20/2021   ?ADDENDUM REPORT: 04/20/2021 16:14 CLINICAL DATA:  66 Year old African American Female EXAM: Cardiac/Coronary  CTA TECHNIQUE: The patient was scanned on a [employer]. FINDINGS: Scan was triggered in the descending thoracic aorta. Axial non-contrast 3 mm slices were carried out through the heart. The data set was analyzed on a dedicated work station and scored using the [employer]. Gantry rotation speed was 250 msecs and collimation was .6 mm. 0.8 mg of sl NTG was given. The 3D data set was reconstructed in 5% intervals of the 67-82 % of the R-R cycle. Diastolic phases were analyzed on a dedicated work station using MPR, MIP and VRT modes. The patient received 100 cc of contrast. Aorta:  Normal size.  No calcifications.  No dissection. Main Pulmonary Artery: Normal size of the pulmonary artery. Aortic Valve:  Tri-leaflet.  No calcifications. Coronary Arteries:  Normal coronary origin.  Right dominance. Coronary Calcium Score: Left main: 0 Left  anterior descending artery: 33 Left circumflex artery: 0 Right coronary artery: 41 Total: 74 Percentile: 80th for age, sex, and race matched control. RCA is a large right dominant artery that gives rise to PDA

## 2021-04-20 NOTE — Consult Note (Addendum)
?Cardiology Consultation:  ? ?Patient ID: Whitney Sanchez ?MRN: [id number]; DOB: 06/26/1955 ? ?Admit date: 04/20/2021 ?Date of Consult: 04/20/2021 ? ?PCP:  [name], MD ?  ?[facility] Providers ?Cardiologist:  Christell Constant, MD new ? ?Patient Profile:  ? ?Whitney Sanchez is a 66 y.o. female with a hx of diabetes, hypertension, and obesity who is being seen 04/20/2021 for the evaluation of chest pain at the request of Dr. Rhunette Croft. ? ?History of Present Illness:  ? ?Whitney Sanchez has no prior cardiac history and does not currently follow with cardiology.  She has treated DM 2 and hypertension.  She presented to [facility] ED 04/20/2021 with 2 weeks of chest pain.  Chest pain can be associated with intermittent shortness of breath and dizziness. ? ?PTA:  losartan and pitavastatin, ozempic ? ?Hypertensive urgency on arrival with BP 198/109 --> improved to 161/65. ? ?Work-up so far reveals troponin elevation mild and flat.  ?EKG with lateral TWI - new from prior tracings. ?CXR nonacute. ? ?Cardiology was asked to evaluate. She denies prior cardiac history and stroke.  ? ?During my exam, she describes chest pain in the center of her chest that radiated to her back. CP occurs with activity and rest, lasts about 5-10 min and spontaneously resolves. Prior to this, she was able to complete 4.0 METS (housework, groceries) without angina. No aggravating or relieving factors. CP not associated with diaphoresis, but is associated with nausea and vomiting. CP increased in intensity yesterday while she was driving making her pull her vehicle to the side of the road. This morning, she was already awake when the pain happened again and caused her to slump over. Her husband who has a significant cardiac history with CAD insisted on evaluation. Husband also states she has been complaining of food getting stuck in her esophagus and of indigestion.  ? ?She did have one episode of what sounds like orthostatic dizziness upon  standing a few days ago. This doesn't normally happen to her.  ? ?She is a never smoker. She has four children and 10 grandchildren. 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 No family history of heart disease.  ? ?Past Medical History:  ?Diagnosis Date  ? DM (diabetes mellitus) (HCC)   ? High cholesterol   ? HTN (hypertension)   ? ? ?Past Surgical History:  ?Procedure Laterality Date  ? ABDOMINAL HYSTERECTOMY  1996  ? total  ?  ? ?Home Medications:  ?Prior to Admission medications   ?Medication Sig Start Date End Date Taking? Authorizing Provider  ?cyclobenzaprine (FLEXERIL) 5 MG tablet One tab po qhs prn ?Patient taking differently: Take 5 mg by mouth at bedtime as needed for muscle spasms. 10/18/19  Yes [name], MD  ?DULoxetine (CYMBALTA) 20 MG capsule TAKE 1 CAPSULE BY MOUTH EVERY DAY ?Patient taking differently: Take 20 mg by mouth daily. 01/14/21  Yes [name], MD  ?losartan (COZAAR) 100 MG tablet TAKE 1 TABLET BY MOUTH  DAILY ?Patient taking differently: Take 100 mg by mouth daily. 12/23/20  Yes [name], MD  ?Pitavastatin Calcium (LIVALO) 4 MG TABS Take 1 tablet (4 mg total) by mouth daily. ?Patient taking differently: Take 4 mg by mouth daily. 01/23/20  Yes [name], MD  ?Semaglutide, 1 MG/DOSE, (OZEMPIC, 1 MG/DOSE,) 4 MG/3ML SOPN DIAL AND INJECT UNDER THE SKIN 1 MG WEEKLY ?Patient taking differently: Inject 1 mg into the skin once a week. 04/02/21  Yes [name],  MD  ? ? ?Inpatient Medications: ?Scheduled Meds: ? aspirin  325 mg Oral Daily  ? metoprolol tartrate  25 mg Oral Once  ? ?Continuous Infusions: ? ?PRN Meds: ? ? ?Allergies:   No Known Allergies ? ?Social History:   ?Social History  ? ?Socioeconomic History  ? Marital status: Married  ?  Spouse name: Not on file  ? Number of children: Not on file  ? Years of education: Not on file  ? Highest education level: Not on file  ?Occupational History  ? Not on file  ?Tobacco Use   ? Smoking status: Never  ?  Passive exposure: Never  ? Smokeless tobacco: Never  ?Vaping Use  ? Vaping Use: Never used  ?Substance and Sexual Activity  ? Alcohol use: Never  ? Drug use: Never  ? Sexual activity: Not on file  ?Other Topics Concern  ? Not on file  ?Social History Narrative  ? Not on file  ? ?Social Determinants of Health  ? ?Financial Resource Strain: Low Risk   ? Difficulty of Paying Living Expenses: Not hard at all  ?Food Insecurity: No Food Insecurity  ? Worried About Programme researcher, broadcasting/film/video in the Last Year: Never true  ? Ran Out of Food in the Last Year: Never true  ?Transportation Needs: No Transportation Needs  ? Lack of Transportation (Medical): No  ? Lack of Transportation (Non-Medical): No  ?Physical Activity: Insufficiently Active  ? Days of Exercise per Week: 1 day  ? Minutes of Exercise per Session: 30 min  ?Stress: No Stress Concern Present  ? Feeling of Stress : Only a little  ?Social Connections: Not on file  ?Intimate Partner Violence: Not on file  ?  ?Family History:   ? ?Family History  ?Problem Relation Age of Onset  ? Diabetes Mother   ? Healthy Father   ? Breast cancer Neg Hx   ? Neuropathy Neg Hx   ?  ? ?ROS:  ?Please see the history of present illness.  ? ?All other ROS reviewed and negative.    ? ?Physical Exam/Data:  ? ?Vitals:  ? 04/20/21 0900 04/20/21 0930 04/20/21 1000 04/20/21 1030  ?BP: (!) 176/97 (!) 171/94 (!) 164/96 (!) 161/95  ?Pulse: 71 71 72 76  ?Resp: 20 19 19 18   ?Temp:      ?TempSrc:      ?SpO2: 99% 99% 98% 99%  ?Weight:      ?Height:      ? ?No intake or output data in the 24 hours ending 04/20/21 1235 ? ?  04/20/2021  ?  7:04 AM 03/06/2021  ?  3:22 PM 02/18/2021  ?  2:52 PM  ?Last 3 Weights  ?Weight (lbs) 186 lb 187 lb 187 lb 9.6 oz  ?Weight (kg) 84.369 kg 84.823 kg 85.095 kg  ?   ?Body mass index is 31.93 kg/m?.  ?General:  Well nourished, well developed, in no acute distress ?HEENT: normal ?Neck: no JVD ?Vascular: No carotid bruits; Distal pulses 2+  bilaterally ?Cardiac:  normal S1, S2; RRR; no murmur  ?Lungs:  clear to auscultation bilaterally, no wheezing, rhonchi or rales  ?Abd: soft, nontender, no hepatomegaly  ?Ext: no edema ?Musculoskeletal:  No deformities, BUE and BLE strength normal and equal ?Skin: warm and dry  ?Neuro:  CNs 2-12 intact, no focal abnormalities noted ?Psych:  Normal affect  ? ?EKG:  The EKG was personally reviewed and demonstrates:  sinus rhythm with HR 96 lateral TWI ?Telemetry:  Telemetry was personally  reviewed and demonstrates:  sinus in the 60s ? ?Relevant CV Studies: ? ?Echo pending ? ?Laboratory Data: ? ?High Sensitivity Troponin:   ?Recent Labs  ?Lab 04/20/21 ?0720 04/20/21 ?0930  ?TROPONINIHS 30* 37*  ?   ?Chemistry ?Recent Labs  ?Lab 04/20/21 ?0720  ?NA 140  ?K 3.8  ?CL 107  ?CO2 23  ?GLUCOSE 126*  ?BUN 12  ?CREATININE 0.86  ?CALCIUM 9.2  ?GFRNONAA >60  ?ANIONGAP 10  ?  ?No results for input(s): PROT, ALBUMIN, AST, ALT, ALKPHOS, BILITOT in the last 168 hours. ?Lipids No results for input(s): CHOL, TRIG, HDL, LABVLDL, LDLCALC, CHOLHDL in the last 168 hours.  ?Hematology ?Recent Labs  ?Lab 04/20/21 ?0720  ?WBC 6.8  ?RBC 4.17  ?HGB 12.0  ?HCT 37.0  ?MCV 88.7  ?MCH 28.8  ?MCHC 32.4  ?RDW 13.3  ?PLT 290  ? ?Thyroid No results for input(s): TSH, FREET4 in the last 168 hours.  ?BNPNo results for input(s): BNP, PROBNP in the last 168 hours.  ?DDimer No results for input(s): DDIMER in the last 168 hours. ? ? ?Radiology/Studies:  ?DG Chest 2 View ? ?Result Date: 04/20/2021 ?CLINICAL DATA:  Chest pain and occasional shortness of breath for 2 weeks pain EXAM: CHEST - 2 VIEW COMPARISON:  None. FINDINGS: The cardiomediastinal silhouette is unremarkable. There is no evidence of focal airspace disease, pulmonary edema, suspicious pulmonary nodule/mass, pleural effusion, or pneumothorax. No acute bony abnormalities are identified. A severe apex RIGHT thoracic scoliosis is present. IMPRESSION: No active cardiopulmonary disease. Electronically  Signed   By: Harmon PierJeffrey  Hu M.D.   On: 04/20/2021 09:07   ? ? ?Assessment and Plan:  ? ?Chest pain ?- hs troponin 30 --> 37 ?- EKG with lateral TWI ?- symptoms are concerning for angina vs dissection given 10/10 pain between h

## 2021-04-21 ENCOUNTER — Encounter: Payer: Medicare Other | Admitting: Internal Medicine

## 2021-04-21 ENCOUNTER — Observation Stay (HOSPITAL_BASED_OUTPATIENT_CLINIC_OR_DEPARTMENT_OTHER): Payer: Medicare PPO

## 2021-04-21 ENCOUNTER — Observation Stay (HOSPITAL_COMMUNITY): Payer: Medicare PPO

## 2021-04-21 DIAGNOSIS — I2510 Atherosclerotic heart disease of native coronary artery without angina pectoris: Secondary | ICD-10-CM | POA: Diagnosis not present

## 2021-04-21 DIAGNOSIS — E785 Hyperlipidemia, unspecified: Secondary | ICD-10-CM

## 2021-04-21 DIAGNOSIS — Z79899 Other long term (current) drug therapy: Secondary | ICD-10-CM | POA: Diagnosis not present

## 2021-04-21 DIAGNOSIS — I160 Hypertensive urgency: Secondary | ICD-10-CM

## 2021-04-21 DIAGNOSIS — I10 Essential (primary) hypertension: Secondary | ICD-10-CM | POA: Diagnosis not present

## 2021-04-21 DIAGNOSIS — R0789 Other chest pain: Secondary | ICD-10-CM | POA: Diagnosis not present

## 2021-04-21 DIAGNOSIS — R079 Chest pain, unspecified: Secondary | ICD-10-CM | POA: Diagnosis not present

## 2021-04-21 DIAGNOSIS — E782 Mixed hyperlipidemia: Secondary | ICD-10-CM

## 2021-04-21 DIAGNOSIS — E119 Type 2 diabetes mellitus without complications: Secondary | ICD-10-CM

## 2021-04-21 DIAGNOSIS — Z794 Long term (current) use of insulin: Secondary | ICD-10-CM | POA: Diagnosis not present

## 2021-04-21 DIAGNOSIS — E1169 Type 2 diabetes mellitus with other specified complication: Secondary | ICD-10-CM | POA: Diagnosis not present

## 2021-04-21 DIAGNOSIS — Z7982 Long term (current) use of aspirin: Secondary | ICD-10-CM | POA: Diagnosis not present

## 2021-04-21 DIAGNOSIS — I700 Atherosclerosis of aorta: Secondary | ICD-10-CM | POA: Diagnosis not present

## 2021-04-21 LAB — GLUCOSE, CAPILLARY: Glucose-Capillary: 117 mg/dL — ABNORMAL HIGH (ref 70–99)

## 2021-04-21 LAB — HEPATIC FUNCTION PANEL
ALT: 20 U/L (ref 0–44)
AST: 24 U/L (ref 15–41)
Albumin: 3.4 g/dL — ABNORMAL LOW (ref 3.5–5.0)
Alkaline Phosphatase: 47 U/L (ref 38–126)
Bilirubin, Direct: 0.1 mg/dL (ref 0.0–0.2)
Indirect Bilirubin: 0.4 mg/dL (ref 0.3–0.9)
Total Bilirubin: 0.5 mg/dL (ref 0.3–1.2)
Total Protein: 6.1 g/dL — ABNORMAL LOW (ref 6.5–8.1)

## 2021-04-21 LAB — LIPID PANEL
Cholesterol: 187 mg/dL (ref 0–200)
HDL: 47 mg/dL (ref >40)
LDL Cholesterol: 95 mg/dL (ref 0–99)
Total CHOL/HDL Ratio: 4 RATIO
Triglycerides: 225 mg/dL — ABNORMAL HIGH (ref <150)
VLDL: 45 mg/dL — ABNORMAL HIGH (ref 0–40)

## 2021-04-21 LAB — BASIC METABOLIC PANEL
Anion gap: 11 (ref 5–15)
BUN: 14 mg/dL (ref 8–23)
CO2: 18 mmol/L — ABNORMAL LOW (ref 22–32)
Calcium: 8.5 mg/dL — ABNORMAL LOW (ref 8.9–10.3)
Chloride: 104 mmol/L (ref 98–111)
Creatinine, Ser: 0.93 mg/dL (ref 0.44–1.00)
GFR, Estimated: >60 mL/min (ref >60)
Glucose, Bld: 98 mg/dL (ref 70–99)
Potassium: 3.8 mmol/L (ref 3.5–5.1)
Sodium: 133 mmol/L — ABNORMAL LOW (ref 135–145)

## 2021-04-21 LAB — CBC
HCT: 33.5 % — ABNORMAL LOW (ref 36.0–46.0)
Hemoglobin: 10.8 g/dL — ABNORMAL LOW (ref 12.0–15.0)
MCH: 28.8 pg (ref 26.0–34.0)
MCHC: 32.2 g/dL (ref 30.0–36.0)
MCV: 89.3 fL (ref 80.0–100.0)
Platelets: 225 10*3/uL (ref 150–400)
RBC: 3.75 MIL/uL — ABNORMAL LOW (ref 3.87–5.11)
RDW: 13.7 % (ref 11.5–15.5)
WBC: 5.9 10*3/uL (ref 4.0–10.5)
nRBC: 0 % (ref 0.0–0.2)

## 2021-04-21 LAB — ECHOCARDIOGRAM COMPLETE
Area-P 1/2: 3.1 cm2
Calc EF: 66.8 %
Height: 64 in
S' Lateral: 1.8 cm
Single Plane A2C EF: 64.1 %
Single Plane A4C EF: 66.4 %
Weight: 2888 oz

## 2021-04-21 LAB — HIV ANTIBODY (ROUTINE TESTING W REFLEX): HIV Screen 4th Generation wRfx: NONREACTIVE

## 2021-04-21 MED ORDER — CARVEDILOL 6.25 MG PO TABS: 6.2500 mg | ORAL_TABLET | Freq: Two times a day (BID) | ORAL | 3 refills | Status: DC

## 2021-04-21 MED ORDER — ASPIRIN 81 MG PO TBEC: 81.0000 mg | DELAYED_RELEASE_TABLET | Freq: Every day | ORAL | 11 refills | Status: DC

## 2021-04-21 MED ORDER — NITROGLYCERIN 0.4 MG SL SUBL: SUBLINGUAL_TABLET | SUBLINGUAL | 3 refills | Status: DC

## 2021-04-21 MED ORDER — ISOSORBIDE MONONITRATE ER 30 MG PO TB24: 30.0000 mg | ORAL_TABLET | Freq: Every day | ORAL | 3 refills | Status: DC

## 2021-04-21 MED ORDER — ROSUVASTATIN CALCIUM 20 MG PO TABS: 20.0000 mg | ORAL_TABLET | Freq: Every day | ORAL | 3 refills | Status: DC

## 2021-04-21 NOTE — TOC Transition Note (Signed)
Transition of Care (TOC) - CM/SW Discharge Note ? ? ?Patient Details  ?Name: Whitney Sanchez ?MRN: [id number] ?Date of Birth: 09/10/1955 ? ?Transition of Care (TOC) CM/SW Contact:  ?Leone Haven, RN ?Phone Number: ?04/21/2021, 10:30 AM ? ? ?Clinical Narrative:    ?Patient is for dc today, she states her husband will transport her home today.  She has no needs. ? ? ?Final next level of care: Home/Self Care ?Barriers to Discharge: No Barriers Identified ? ? ?Patient Goals and CMS Choice ?Patient states their goals for this hospitalization and ongoing recovery are:: return home ?  ?  ? ?Discharge Placement ?  ?           ?  ?  ?  ?  ? ?Discharge Plan and Services ?  ?  ?           ?  ?DME Agency: NA ?  ?  ?  ?HH Arranged: NA ?  ?  ?  ?  ? ?Social Determinants of Health (SDOH) Interventions ?  ? ? ?Readmission Risk Interventions ?   ? View : No data to display.  ?  ?  ?  ? ? ? ? ? ?

## 2021-04-21 NOTE — Progress Notes (Addendum)
Completed discharge instructions. Explained discharge summary. Reviewed follow up appointments and next medication administration times. Patient verbalized having an understanding of the discharge instructions. Transported down to discharge lounge for discharge. Patient was wearing central telemetry monitoring at the time of discharge. All belongings were in the patient's care at the time of discharge.  ?

## 2021-04-21 NOTE — Consult Note (Signed)
? ?  [facility] CM Inpatient Consult ? ? ?04/21/2021 ? ?Whitney Sanchez ?11/08/1955 ?[id number] ? ?[facility] [ACO] Patient: [employer]  ? ?Primary Care Provider:  Glendale Chard, MD, [facility], is an embedded provider with a Chronic Care Management team and program, and is listed for the transition of care follow up and appointments. ? ?Patient was screened for Embedded practice service needs for chronic care management and she is active with Embedded pharmacist.  Met with the patient and she confirms she is active in CCM program.  Patient denies any additional needs. Patient was given a reminder card for appointment follow up and a 24 hour nurse advise line magnet. ? ?Plan: Notification can be sent to the [facility] Management however patient denies any additional needs at this time. ? ?Please contact for further questions, ? ?Natividad Brood, RN BSN CCM ?[facility] Liaison ? [phone] business mobile phone ?Toll free office [phone]  ?Fax number: [phone] ?[email or web address] ? ? ? ?

## 2021-04-21 NOTE — Plan of Care (Signed)
  Problem: Education: Goal: Knowledge of General Education information will improve Description: Including pain rating scale, medication(s)/side effects and non-pharmacologic comfort measures Outcome: Adequate for Discharge   Problem: Health Behavior/Discharge Planning: Goal: Ability to manage health-related needs will improve Outcome: Adequate for Discharge   Problem: Clinical Measurements: Goal: Ability to maintain clinical measurements within normal limits will improve Outcome: Adequate for Discharge Goal: Will remain free from infection Outcome: Adequate for Discharge Goal: Diagnostic test results will improve Outcome: Adequate for Discharge Goal: Respiratory complications will improve Outcome: Adequate for Discharge Goal: Cardiovascular complication will be avoided Outcome: Adequate for Discharge   Problem: Activity: Goal: Risk for activity intolerance will decrease Outcome: Adequate for Discharge   Problem: Nutrition: Goal: Adequate nutrition will be maintained Outcome: Adequate for Discharge   Problem: Coping: Goal: Level of anxiety will decrease Outcome: Adequate for Discharge   Problem: Coping: Goal: Level of anxiety will decrease Outcome: Adequate for Discharge   Problem: Elimination: Goal: Will not experience complications related to bowel motility Outcome: Adequate for Discharge Goal: Will not experience complications related to urinary retention Outcome: Adequate for Discharge   Problem: Pain Managment: Goal: General experience of comfort will improve Outcome: Adequate for Discharge   Problem: Safety: Goal: Ability to remain free from injury will improve Outcome: Adequate for Discharge   Problem: Skin Integrity: Goal: Risk for impaired skin integrity will decrease Outcome: Adequate for Discharge   

## 2021-04-21 NOTE — Plan of Care (Signed)

## 2021-04-21 NOTE — Discharge Summary (Addendum)
?Discharge Summary  ?  ?Patient ID: Whitney Sanchez ?MRN: [id number]; DOB: [birth year] ? ?Admit date: 04/20/2021 ?Discharge date: 04/21/2021 ? ?PCP:  [name], MD ?  ?[facility] Providers ?Cardiologist:  Christell Constant, MD  ? ?Discharge Diagnoses  ?  ?Principal Problem: ?  Chest pain ?Active Problems: ?  Essential hypertension, benign ?  Hyperlipidemia LDL goal <70 ?  Hypertensive urgency ?  DM (diabetes mellitus), type 2 (HCC) ? ? ?Diagnostic Studies/Procedures  ?  ?CT coronary with FFR 04/21/21: ?IMPRESSION: ?1. Coronary calcium score of 74. This was 80th percentile for age, ?sex, and race matched control. ?  ?2. Normal coronary origin with right dominance. ?  ?3. CAD-RADS 3. Moderate stenosis. Consider symptom-guided ?anti-ischemic pharmacotherapy as well as risk factor modification ?per guideline directed care. Additional analysis with CT FFR will be ?submitted. ? ?1. Left Main: No significant functional stenosis, CT-FFR 0.99. ?  ?2. LAD: No significant functional stenosis, CT-FFR 0.83 distal ?vessel. ?3. LCX: No significant functional stenosis, CT-FFR 0.97 and luminal ?narrowing. ?4. RCA: No significant functional stenosis, CT-FFR 0.85 distal ?vessel. ?  ?IMPRESSION: ?1. CT FFR analysis shows no evidence of significant functional ?stenosis. ?_____________ ? ?Echo 04/20/21: ?Final read pending ? ?  ?History of Present Illness   ?  ?Whitney Sanchez is a 66 y.o. female with a hx of diabetes, hypertension, and obesity who is was seen 04/20/2021 for the evaluation of chest pain.  ? ?Whitney Sanchez has no prior cardiac history and does not currently follow with cardiology.  She has treated DM 2 and hypertension.  She presented to [facility] ED 04/20/2021 with 2 weeks of chest pain.  Chest pain can be associated with intermittent shortness of breath and dizziness. ?  ?PTA:  losartan and pitavastatin, ozempic ?  ?Hypertensive urgency on arrival with BP 198/109 --> improved to 161/65. ?  ?Work-up so far reveals  troponin elevation mild and flat.  ?EKG with lateral TWI - new from prior tracings. ?CXR nonacute. ?  ?Cardiology was asked to evaluate. She denies prior cardiac history and stroke.  ?  ?During my exam, she describes chest pain in the center of her chest that radiated to her back. CP occurs with activity and rest, lasts about 5-10 min and spontaneously resolves. Prior to this, she was able to complete 4.0 METS (housework, groceries) without angina. No aggravating or relieving factors. CP not associated with diaphoresis, but is associated with nausea and vomiting. CP increased in intensity yesterday while she was driving making her pull her vehicle to the side of the road. This morning, she was already awake when the pain happened again and caused her to slump over. Her husband who has a significant cardiac history with CAD insisted on evaluation. Husband also states she has been complaining of food getting stuck in her esophagus and of indigestion.  ?  ?She did have one episode of what sounds like orthostatic dizziness upon standing a few days ago. This doesn't normally happen to her.  ?  ?She is a never smoker. She has four children and 10 grandchildren. 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 No family history of heart disease.  ? ?Hospital Course  ?   ?Consultants: none ? ?Chest pain ?Nonobstructive CAD ?EKG with TWI. Symptoms concerning for angina vs dissection. Given her hypertension, she underwent CTA aorta protocol and CT coronary. CT was negative for dissection. CCTA with a coronary calcium score of 74 which was  the 80th percentile. Images were sent for FFR analysis. FFR was not significant in the LAD, LCx, or RCA. May consider noncardiac causes for chest pain. Given nonobstructive disease, will focus on risk factor modification. Will continue low dose ASA. Statin changed to crestor as below. Continue low dose coreg and imdur. ? ?Echocardiogram revealed  normal EF, final read pending. ? ? ?Hypertensive urgency ?Hypertension - uncontrolled ?Pt was significantly hypertensive on arrival, suspect this was contributing to her chest pain. Evidence of uncontrolled hypertension on echo.  PTA maintained on 100 mg losartan. BP has improved significantly with the addition of coreg and imdur. Resume losartan at discharge. Will bring BP log to appt. ? ? ?Hyperlipidemia with LDL goal < 55 ?04/21/2021: Cholesterol 187; HDL 47; LDL Cholesterol 95; Triglycerides 225; VLDL 45 ?Pitavastatin changed to crestor ?Given DM, would prefer LDL less than 55. ? ? ?DM2 ?A1c 5.9% ?On ozempic  ? ? ?Pt seen and examined by Dr. Izora Ribashandrasekhar and deemed stable for discharge. I will arrange cardiology follow up in the next 3-4 weeks. ? ? ?Did the patient have an acute coronary syndrome (MI, NSTEMI, STEMI, etc) this admission?:  No                               ?Did the patient have a percutaneous coronary intervention (stent / angioplasty)?:  No.   ? ?   ?_____________ ? ?Discharge Vitals ?Blood pressure 134/84, pulse 73, temperature 98.4 ?F (36.9 ?C), temperature source Oral, resp. rate 17, height 5\' 4"  (1.626 m), weight 81.9 kg, SpO2 98 %.  ?Filed Weights  ? 04/20/21 0704 04/21/21 0329  ?Weight: 84.4 kg 81.9 kg  ? ? ?Labs & Radiologic Studies  ?  ?CBC ?Recent Labs  ?  04/20/21 ?0720 04/21/21 ?0222  ?WBC 6.8 5.9  ?HGB 12.0 10.8*  ?HCT 37.0 33.5*  ?MCV 88.7 89.3  ?PLT 290 225  ? ?Basic Metabolic Panel ?Recent Labs  ?  04/20/21 ?0720 04/21/21 ?0222  ?NA 140 133*  ?K 3.8 3.8  ?CL 107 104  ?CO2 23 18*  ?GLUCOSE 126* 98  ?BUN 12 14  ?CREATININE 0.86 0.93  ?CALCIUM 9.2 8.5*  ? ?Liver Function Tests ?Recent Labs  ?  04/21/21 ?0222  ?AST 24  ?ALT 20  ?ALKPHOS 47  ?BILITOT 0.5  ?PROT 6.1*  ?ALBUMIN 3.4*  ? ?No results for input(s): LIPASE, AMYLASE in the last 72 hours. ?High Sensitivity Troponin:   ?Recent Labs  ?Lab 04/20/21 ?0720 04/20/21 ?0930  ?TROPONINIHS 30* 37*  ?  ?BNP ?Invalid input(s):  POCBNP ?D-Dimer ?No results for input(s): DDIMER in the last 72 hours. ?Hemoglobin A1C ?No results for input(s): HGBA1C in the last 72 hours. ?Fasting Lipid Panel ?Recent Labs  ?  04/21/21 ?0222  ?CHOL 187  ?HDL 47  ?LDLCALC 95  ?TRIG 225*  ?CHOLHDL 4.0  ? ?Thyroid Function Tests ?No results for input(s): TSH, T4TOTAL, T3FREE, THYROIDAB in the last 72 hours. ? ?Invalid input(s): FREET3 ?_____________  ?DG Chest 2 View ? ?Result Date: 04/20/2021 ?CLINICAL DATA:  Chest pain and occasional shortness of breath for 2 weeks pain EXAM: CHEST - 2 VIEW COMPARISON:  None. FINDINGS: The cardiomediastinal silhouette is unremarkable. There is no evidence of focal airspace disease, pulmonary edema, suspicious pulmonary nodule/mass, pleural effusion, or pneumothorax. No acute bony abnormalities are identified. A severe apex RIGHT thoracic scoliosis is present. IMPRESSION: No active cardiopulmonary disease. Electronically Signed   By: Harmon PierJeffrey  Hu  M.D.   On: 04/20/2021 09:07  ? ?CT CORONARY MORPH W/CTA COR W/SCORE W/CA W/CM &/OR WO/CM ? ?Addendum Date: 04/20/2021   ?ADDENDUM REPORT: 04/20/2021 16:14 CLINICAL DATA:  66 Year old African American Female EXAM: Cardiac/Coronary  CTA TECHNIQUE: The patient was scanned on a [employer]. FINDINGS: Scan was triggered in the descending thoracic aorta. Axial non-contrast 3 mm slices were carried out through the heart. The data set was analyzed on a dedicated work station and scored using the Agatson method. Gantry rotation speed was 250 msecs and collimation was .6 mm. 0.8 mg of sl NTG was given. The 3D data set was reconstructed in 5% intervals of the 67-82 % of the R-R cycle. Diastolic phases were analyzed on a dedicated work station using MPR, MIP and VRT modes. The patient received 100 cc of contrast. Aorta:  Normal size.  No calcifications.  No dissection. Main Pulmonary Artery: Normal size of the pulmonary artery. Aortic Valve:  Tri-leaflet.  No calcifications. Coronary Arteries:   Normal coronary origin.  Right dominance. Coronary Calcium Score: Left main: 0 Left anterior descending artery: 33 Left circumflex artery: 0 Right coronary artery: 41 Total: 74 Percentile: 80th for age, sex, and race mat

## 2021-04-22 ENCOUNTER — Telehealth: Payer: Self-pay

## 2021-04-22 NOTE — Telephone Encounter (Signed)
[facility].  

## 2021-04-23 ENCOUNTER — Telehealth: Payer: Self-pay

## 2021-04-23 NOTE — Telephone Encounter (Signed)
Transition Care Management Follow-up Telephone Call ?Date of discharge and from where: Whitney Sanchez 04/21/21 ?How have you been since you were released from the hospital? She has been doing okay.  ?Any questions or concerns? No ? ?Items Reviewed: ?Did the pt receive and understand the discharge instructions provided? Yes  ?Medications obtained and verified? Yes  ?Other? No  ?Any new allergies since your discharge? No  ?Dietary orders reviewed? No ?Do you have support at home? Yes  ? ?Home Care and Equipment/Supplies: ?Were home health services ordered? no ?If so, what is the name of the agency? N/a  ?Has the agency set up a time to come to the patient's home? not applicable ?Were any new equipment or medical supplies ordered?  No ?What is the name of the medical supply agency? N/a ?Were you able to get the supplies/equipment? not applicable ?Do you have any questions related to the use of the equipment or supplies? No ? ?Functional Questionnaire: (I = Independent and D = Dependent) ?ADLs: I ? ?Bathing/Dressing- I ? ?Meal Prep- I ? ?Eating- I ? ?Maintaining continence- I ? ?Transferring/Ambulation- I ? ?Managing Meds- I ? ?Follow up appointments reviewed: ? ?PCP Hospital f/u appt confirmed? Yes  waiting on provider to tell me what date and time due to schedule being full. ?[facility] f/u appt confirmed? Yes  Scheduled to see Dr.Chandrasekhar on 05/28/21 7:45am ?Are transportation arrangements needed? No  ?If their condition worsens, is the pt aware to call PCP or go to the Emergency Dept.? Yes ?Was the patient provided with contact information for the PCP's office or ED? Yes ?Was to pt encouraged to call back with questions or concerns? Yes  ?

## 2021-04-24 ENCOUNTER — Encounter: Payer: Self-pay | Admitting: Internal Medicine

## 2021-04-24 ENCOUNTER — Ambulatory Visit (INDEPENDENT_AMBULATORY_CARE_PROVIDER_SITE_OTHER): Payer: Medicare PPO | Admitting: Internal Medicine

## 2021-04-24 VITALS — BP 140/90 | HR 85 | Temp 98.1°F | Ht 62.8 in | Wt 181.8 lb

## 2021-04-24 DIAGNOSIS — I10 Essential (primary) hypertension: Secondary | ICD-10-CM

## 2021-04-24 DIAGNOSIS — I700 Atherosclerosis of aorta: Secondary | ICD-10-CM

## 2021-04-24 DIAGNOSIS — E7800 Pure hypercholesterolemia, unspecified: Secondary | ICD-10-CM | POA: Diagnosis not present

## 2021-04-24 DIAGNOSIS — E1165 Type 2 diabetes mellitus with hyperglycemia: Secondary | ICD-10-CM

## 2021-04-24 DIAGNOSIS — E6609 Other obesity due to excess calories: Secondary | ICD-10-CM

## 2021-04-24 DIAGNOSIS — R0789 Other chest pain: Secondary | ICD-10-CM

## 2021-04-24 DIAGNOSIS — Z6832 Body mass index (BMI) 32.0-32.9, adult: Secondary | ICD-10-CM | POA: Diagnosis not present

## 2021-04-24 DIAGNOSIS — Z2821 Immunization not carried out because of patient refusal: Secondary | ICD-10-CM

## 2021-04-24 DIAGNOSIS — Z79899 Other long term (current) drug therapy: Secondary | ICD-10-CM | POA: Diagnosis not present

## 2021-04-24 DIAGNOSIS — I119 Hypertensive heart disease without heart failure: Secondary | ICD-10-CM | POA: Diagnosis not present

## 2021-04-24 DIAGNOSIS — R5383 Other fatigue: Secondary | ICD-10-CM | POA: Diagnosis not present

## 2021-04-24 DIAGNOSIS — E66811 Obesity, class 1: Secondary | ICD-10-CM

## 2021-04-24 MED ORDER — ROSUVASTATIN CALCIUM 20 MG PO TABS: 20.0000 mg | ORAL_TABLET | Freq: Every day | ORAL | 3 refills | Status: DC

## 2021-04-24 MED ORDER — VALSARTAN 160 MG PO TABS: 160.0000 mg | ORAL_TABLET | Freq: Every day | ORAL | 2 refills | Status: DC

## 2021-04-24 MED ORDER — ISOSORBIDE MONONITRATE ER 30 MG PO TB24: 30.0000 mg | ORAL_TABLET | Freq: Every day | ORAL | 3 refills | Status: DC

## 2021-04-24 MED ORDER — CARVEDILOL 6.25 MG PO TABS: 6.2500 mg | ORAL_TABLET | Freq: Two times a day (BID) | ORAL | 3 refills | Status: DC

## 2021-04-24 MED ORDER — CYANOCOBALAMIN 1000 MCG/ML IJ SOLN
1000.0000 ug | Freq: Once | INTRAMUSCULAR | Status: AC
  Administered 2021-04-24: 1000 ug via INTRAMUSCULAR

## 2021-04-24 NOTE — Progress Notes (Signed)
?Rich Brave [place]leton,acting as a Education administrator for [name], MD.,have documented all relevant documentation on the behalf of [name], MD,as directed by  [name], MD while in the presence of [name], MD.  ?This visit occurred during the SARS-CoV-2 public health emergency.  Safety protocols were in place, including screening questions prior to the visit, additional usage of staff PPE, and extensive cleaning of exam room while observing appropriate contact time as indicated for disinfecting solutions. ? ?Subjective:  ?  ? Patient ID: Whitney Sanchez , female    DOB: 1955/10/31 , 66 y.o.   MRN: [id number] ? ? ?Chief Complaint  ?Patient presents with  ? hospital f/u  ?  Patient was admitted to [facility] with chest pain on 04/20/21 and discharged on 04/21/21.  ? ? ?HPI ? ?Admit date: 04/20/21 ?Discharge date: 04/21/21 ? ?Patient presents today for a hospital f/u. She presented for further evaluation of chest pain. She states her sx started about 2 weeks prior. She describes it as a chest tightness that radiated to her back. Initially, thought it was indigestion - had several episodes of n/v. She is not sure what triggered the pain. She states the pain occurred both at rest and w/ exertion. There was associated sob and occasional dizziness.  She was quite hypertensive on presentation 198/109, eventually improved to 161/65.  She was evaluated by Cardiology, EKG showed NSR w/ new lateral TWI.  CT FFR analysis showed no evidence of significant functional stenosis. Echocardiogram revealed Left ventricular ejection fraction, by estimation, is 60 to 65%. The left ventricle has normal function. The left ventricle has no regional wall motion abnormalities. There ismoderate asymmetric left ventricular hypertrophy of the septal segment. Left ?ventricular diastolic parameters are consistent with Grade I diastolic dysfunction (impaired relaxation). ? ?Since discharge, she denies having any further episodes of  chest pain. She states she is terribly fatigued. Her BP meds have been changed - she is still on losartan, but coreg and Imdur have been added to her regimen. Regarding her cholesterol, she is now on rosuvastatin, goal LDL < 55. She is no longer having chest reports she is feeling fine. She states she has been taking her discharge meds as prescribed.  ?  ? ?Past Medical History:  ?Diagnosis Date  ? DM (diabetes mellitus) ([place])   ? High cholesterol   ? HTN (hypertension)   ?  ? ?Family History  ?Problem Relation Age of Onset  ? Diabetes Mother   ? Healthy Father   ? Breast cancer Neg Hx   ? Neuropathy Neg Hx   ? ? ? ?Current Outpatient Medications:  ?  aspirin EC 81 MG EC tablet, Take 1 tablet (81 mg total) by mouth daily. Swallow whole., Disp: 30 tablet, Rfl: 11 ?  DULoxetine (CYMBALTA) 20 MG capsule, TAKE 1 CAPSULE BY MOUTH EVERY DAY (Patient taking differently: Take 20 mg by mouth daily.), Disp: 90 capsule, Rfl: 1 ?  nitroGLYCERIN (NITROSTAT) 0.4 MG SL tablet, Place 1 tablet (0.4 mg total) under the tongue every 5 (five) minutes x 3 doses as needed for chest pain., Disp: 25 tablet, Rfl: 3 ?  Semaglutide, 1 MG/DOSE, (OZEMPIC, 1 MG/DOSE,) 4 MG/3ML SOPN, DIAL AND INJECT UNDER THE SKIN 1 MG WEEKLY (Patient taking differently: Inject 1 mg into the skin once a week.), Disp: 6 mL, Rfl: 2 ?  valsartan (DIOVAN) 160 MG tablet, Take 1 tablet (160 mg total) by mouth daily., Disp: 90 tablet, Rfl: 2 ?  amLODipine (NORVASC) 5 MG tablet, Take 1 tablet (5 mg total) by mouth at bedtime., Disp: 90 tablet, Rfl: 1 ?  blood glucose meter kit and supplies KIT, Dispense based on patient and insurance preference. Use up to four times daily as directed. Dx code e11.65, Disp: 1 each, Rfl: 4 ?  carvedilol (COREG) 6.25 MG tablet, Take 1 tablet (6.25 mg total) by mouth 2 (two) times daily with a meal., Disp: 180 tablet, Rfl: 3 ?  cyclobenzaprine (FLEXERIL) 5 MG tablet, One tab po qhs prn (Patient not taking: Reported on 04/24/2021), Disp: 30  tablet, Rfl: 0 ?  isosorbide mononitrate (IMDUR) 30 MG 24 hr tablet, Take 1 tablet (30 mg total) by mouth daily., Disp: 90 tablet, Rfl: 3 ?  rosuvastatin (CRESTOR) 20 MG tablet, Take 1 tablet (20 mg total) by mouth daily., Disp: 90 tablet, Rfl: 3  ? ?No Known Allergies  ? ?Review of Systems  ?Constitutional: Negative.   ?Respiratory: Negative.    ?Cardiovascular: Negative.   ?Gastrointestinal: Negative.   ?Neurological: Negative.   ?Psychiatric/Behavioral: Negative.     ? ?Today's Vitals  ? 04/24/21 0824  ?BP: 140/90  ?Pulse: 85  ?Temp: 98.1 ?F (36.7 ?C)  ?Weight: 181 lb 12.8 oz (82.5 kg)  ?Height: 5' 2.8" (1.595 m)  ?PainSc: 0-No pain  ? ?Body mass index is 32.41 kg/m?.  ?Wt Readings from Last 3 Encounters:  ?04/29/21 181 lb (82.1 kg)  ?04/24/21 181 lb 12.8 oz (82.5 kg)  ?04/21/21 180 lb 8 oz (81.9 kg)  ?  ? ?Objective:  ?Physical Exam ?Vitals and nursing note reviewed.  ?Constitutional:   ?   Appearance: Normal appearance.  ?HENT:  ?   Head: Normocephalic and atraumatic.  ?   Nose:  ?   Comments: Masked  ?   Mouth/Throat:  ?   Comments: Masked  ?Eyes:  ?   Extraocular Movements: Extraocular movements intact.  ?Cardiovascular:  ?   Rate and Rhythm: Normal rate and regular rhythm.  ?   Heart sounds: Normal heart sounds.  ?Pulmonary:  ?   Effort: Pulmonary effort is normal.  ?   Breath sounds: Normal breath sounds.  ?Musculoskeletal:  ?   Cervical back: Normal range of motion.  ?Skin: ?   General: Skin is warm.  ?Neurological:  ?   General: No focal deficit present.  ?   Mental Status: She is alert.  ?   Deep Tendon Reflexes: Abnormal reflex: r trans.  ?Psychiatric:     ?   Mood and Affect: Mood normal.     ?   Behavior: Behavior normal.  ?  ? ?   ?Assessment And Plan:  ?   ?1. Non-cardiac chest pain ?Comments: TCM PERFORMED. A MEMBER OF THE CLINICAL TEAM SPOKE WITH THE PATIENT UPON DISCHARGE. DISCHARGE SUMMARY WAS REVIEWED IN FULL DETAIL DURING THE VISIT. MEDS RECONCILED AND COMPARED TO DISCHARGE MEDS. MEDICATION  LIST WAS UPDATED AND REVIEWED WITH THE PATIENT. GREATER THAN 50% FACE TO FACE TIME WAS SPENT IN COUNSELING AND COORDINATION OF CARE. ALL QUESTIONS WERE ANSWERED TO THE SATISFACTION OF THE PATIENT.  ?Hospital records reviewed in full detail. Importance of risk factor modification to further decrease cardiac risk was d/w patient. She is advised to avoid fried foods, gradually increase daily activity and making exercise a priority.  ? ?2. Pure hypercholesterolemia ?Comments: Chronic, I will cancel further mailings of Livalo, She is now on rosuvastatin.  ?- TSH ? ?3. Aortic atherosclerosis ([place]) ?Comments: Chronic, importance of dietary compliance and  compliance with ASA and rosuvastatin was stressed to the patient.  ? ?4. Other fatigue ?Comments: I will check vitamin B12 level. She was also given vitamin B12 IM x1.  ?- cyanocobalamin ((VITAMIN B-12)) injection 1,000 mcg ? ?5. Hypertensive heart disease without heart failure ?Comments: Improved control, not yet at goal. I plan to switch her from losartan to valsartan when she runs out of her current supply of losartan. She will f/u four weeks after starting valsartan.  ? ?6. Uncontrolled type 2 diabetes mellitus with hyperglycemia ([place]) ?Comments: Chronic, importance of dietary, medication compliance was d/w patient.  ?- Urine Albumin-Creatinine with uACR ? ?7. Class 1 obesity due to excess calories with serious comorbidity and body mass index (BMI) of 32.0 to 32.9 in adult ?Comments: She is encouraged to gradually aim for at least 150 minutes of exercise per week, while striving for BMI<30 to decrease cardiac risk.  ? ?8. Drug therapy ?- Vitamin B12 ? ?9. Herpes zoster vaccination declined ?  ?Patient was given opportunity to ask questions. Patient verbalized understanding of the plan and was able to repeat key elements of the plan. All questions were answered to their satisfaction.  ? ?I, [name], MD, have reviewed all documentation for this visit. The  documentation on 04/28/21 for the exam, diagnosis, procedures, and orders are all accurate and complete.  ? ?IF YOU HAVE BEEN REFERRED TO A SPECIALIST, IT MAY TAKE 1-2 WEEKS TO SCHEDULE/PROCESS THE REFERRAL. IF YOU H

## 2021-04-28 LAB — MICROALBUMIN / CREATININE URINE RATIO
Creatinine, Urine: 55.7 mg/dL
Microalb/Creat Ratio: 31 mg/g creat — ABNORMAL HIGH (ref 0–29)
Microalbumin, Urine: 17.3 ug/mL

## 2021-04-28 LAB — TSH: TSH: 1.85 u[IU]/mL (ref 0.450–4.500)

## 2021-04-28 LAB — VITAMIN B12: Vitamin B-12: 1687 pg/mL — ABNORMAL HIGH (ref 232–1245)

## 2021-04-29 ENCOUNTER — Other Ambulatory Visit: Payer: Self-pay

## 2021-04-29 ENCOUNTER — Telehealth: Payer: Self-pay

## 2021-04-29 ENCOUNTER — Ambulatory Visit: Payer: Medicare PPO

## 2021-04-29 VITALS — BP 164/92 | HR 81 | Temp 98.4°F | Ht 64.0 in | Wt 181.0 lb

## 2021-04-29 DIAGNOSIS — I10 Essential (primary) hypertension: Secondary | ICD-10-CM

## 2021-04-29 MED ORDER — BLOOD GLUCOSE MONITOR KIT: PACK | 4 refills | Status: AC

## 2021-04-29 MED ORDER — AMLODIPINE BESYLATE 5 MG PO TABS: 5.0000 mg | ORAL_TABLET | Freq: Every evening | ORAL | 1 refills | Status: DC

## 2021-04-29 NOTE — Progress Notes (Signed)
Patient presents today for BPC. She is currently taking valsartan 160 and carvedilol 6.25 twice daily.  ?BP Readings from Last 3 Encounters:  ?04/29/21 (!) 164/92  ?04/24/21 140/90  ?04/21/21 134/84  ? ?Patient advised to increase water intake. Provider would like to start amlodipine 5mg  at bedtime. Pt will return in 2 weeks for nurse visit.  Pt expresses understanding.  ?

## 2021-04-29 NOTE — Chronic Care Management (AMB) (Signed)
? ? ?Chronic Care Management ?Pharmacy Assistant  ? ?Name: Whitney Sanchez  MRN: [id number] DOB: Apr 18, 1955 ? ?Reason for Encounter: Disease State/ General ? ?Recent office visits:  ?04-24-2021 Whitney Peng, MD. Microalb/Creat ratio= 31. B12= 1,687. STOP losartan. START Valsartan 160 mg daily. ? ?03-06-2021 Whitney Merino, LPN. Medicare annual wellness visit.  ? ?Recent consult visits:  ?None ? ?Hospital visits:  ?Medication Reconciliation was completed by comparing discharge summary, patient?s EMR and Pharmacy list, and upon discussion with patient. ? ?Admitted to the hospital on 04-02-023 due to chest pain. Discharge date was 04-21-2021. Discharged from [facility]   ? ?New?Medications Started at [facility] Discharge:?? ?Aspirin 81 mg daily ?Carvedilol 6.25 mg twice daily ?Isosorbide mononitrate 30 mg daily ?Nitroglycerin 0.4 mg under the tongue every 5 (five) minutes x 3 doses as needed for chest pain. ?Rosuvastatin 20 mg daily ? ?Medication Changes at Hospital Discharge: ?None ? ?Medications Discontinued at Hospital Discharge: ?Livalo ? ?Medications that remain the same after Hospital Discharge:??  ?-All other medications will remain the same.   ? ?Medications: ?Outpatient Encounter Medications as of 04/29/2021  ?Medication Sig  ? aspirin EC 81 MG EC tablet Take 1 tablet (81 mg total) by mouth daily. Swallow whole.  ? carvedilol (COREG) 6.25 MG tablet Take 1 tablet (6.25 mg total) by mouth 2 (two) times daily with a meal.  ? cyclobenzaprine (FLEXERIL) 5 MG tablet One tab po qhs prn (Patient not taking: Reported on 04/24/2021)  ? DULoxetine (CYMBALTA) 20 MG capsule TAKE 1 CAPSULE BY MOUTH EVERY DAY (Patient taking differently: Take 20 mg by mouth daily.)  ? isosorbide mononitrate (IMDUR) 30 MG 24 hr tablet Take 1 tablet (30 mg total) by mouth daily.  ? nitroGLYCERIN (NITROSTAT) 0.4 MG SL tablet Place 1 tablet (0.4 mg total) under the tongue every 5 (five) minutes x 3 doses as needed for chest pain.  ?  rosuvastatin (CRESTOR) 20 MG tablet Take 1 tablet (20 mg total) by mouth daily.  ? Semaglutide, 1 MG/DOSE, (OZEMPIC, 1 MG/DOSE,) 4 MG/3ML SOPN DIAL AND INJECT UNDER THE SKIN 1 MG WEEKLY (Patient taking differently: Inject 1 mg into the skin once a week.)  ? valsartan (DIOVAN) 160 MG tablet Take 1 tablet (160 mg total) by mouth daily.  ? ?No facility-administered encounter medications on file as of 04/29/2021.  ?Contacted Whitney Sanchez for general disease state and medication adherence call.  ? ?Patient is not > 5 days past due for refill on the following medications per chart history: ? ?What concerns do you have about your medications? Patient stated no  ? ?The patient reports the following side effects with her medications. Patient states she has been getting a headache since starting rosuvastatin. Patient stated tylenol isn't helping. ? ?How often do you forget or accidentally miss a dose? Never ? ?Do you use a pillbox? No ? ?Are you having any problems getting your medications from your pharmacy? No ? ?Has the cost of your medications been a concern? No ? ?Since last visit with CPP, no interventions have been made:  ? ?The patient has had an ED visit since last contact.  ? ?The patient reports the following and denies problems with their health. Patient stated she had some tightness in her chest this morning and had to take her nitrostat for the first time. Patient reported feeling better. ? ?she reports the following  concerns or questions for Whitney Sanchez, at this time. Patient is concerned about headaches since starting rosuvastatin. ? ?Patient  states BP readings are as follows: 147/85, 104/58, 158/78. ? ?Patient reported not having a blood sugar machine. Sent message to Whitney Sanchez and Whitney Sanchez about concerns. ? ? ?Care Gaps: ?Yearly foot exam overdue ?Covid booster overdue ?Last AWV 03-06-2021 ? ?Star Rating Drugs: ?Valsartan 160 mg- Last filled 04-24-2021 [facility] ?Rosuvastatin 20 mg- Last  filled 04-21-2021 [facility] ?Ozempic 1 mg- Patient assistance ? ?[name] CMA ?Clinical Pharmacist Assistant ?[phone] ? ?

## 2021-05-11 ENCOUNTER — Encounter: Payer: Self-pay | Admitting: Internal Medicine

## 2021-05-13 ENCOUNTER — Ambulatory Visit: Payer: Medicare PPO

## 2021-05-13 VITALS — BP 110/80 | HR 86 | Temp 98.3°F | Ht 64.0 in | Wt 181.0 lb

## 2021-05-13 DIAGNOSIS — I10 Essential (primary) hypertension: Secondary | ICD-10-CM

## 2021-05-13 NOTE — Progress Notes (Signed)
Pt presents today for bpc. She tooks meds before appointment today. Amlodipine 5MG  at night. She has valsartan 160mg  listed but she does not have medication with her along with other medications she brought in today & she reports not taking.  ?BP Readings from Last 3 Encounters:  ?05/13/21 110/80  ?04/29/21 (!) 164/92  ?04/24/21 140/90  ? ?Provider reports bp looking better than previous reading. Pt has appointment with provider on 07/24/2021.  ?

## 2021-05-20 NOTE — Progress Notes (Deleted)
Cardiology Office Note    Date:  05/20/2021   ID:  Whitney Sanchez, DOB 09-26-1955, MRN [id number]   PCP:  Dorothyann PengSanders, Robyn, MD    [facility]  Cardiologist:  Christell ConstantMahesh A Chandrasekhar, MD   Advanced Practice Provider:  No care team member to display Electrophysiologist:  None   [phone]}   No chief complaint on file.   History of Present Illness:  Whitney Sanchez is a 66 y.o. female with a hx of diabetes, hypertension, and obesity.    [facility] with 2 weeks of chest pain.  Chest pain can be associated with intermittent shortness of breath and dizziness. Hypertensive urgency on arrival with BP 198/109 --> improved to 161/65. EKG with lateral TWI - new from prior tracings.    Coronary CTA calcium score 74, FFR not significant in LAD, Cfx or RCA.     Past Medical History:  Diagnosis Date   DM (diabetes mellitus) ([facility])    High cholesterol    HTN (hypertension)     Past Surgical History:  Procedure Laterality Date   ABDOMINAL HYSTERECTOMY  1996   total    Current Medications: No outpatient medications have been marked as taking for the 05/28/21 encounter (Appointment) with Dyann KiefLenze, Taylor Spilde M, PA-C.     Allergies:   Patient has no known allergies.   Social History   Socioeconomic History   Marital status: Married    Spouse name: Not on file   Number of children: Not on file   Years of education: Not on file   Highest education level: Not on file  Occupational History   Not on file  Tobacco Use   Smoking status: Never    Passive exposure: Never   Smokeless tobacco: Never  Vaping Use   Vaping Use: Never used  Substance and Sexual Activity   Alcohol use: Never   Drug use: Never   Sexual activity: Not on file  Other Topics Concern   Not on file  Social History Narrative   Not on file   Social Determinants of Health   Financial Resource Strain: Low Risk    Difficulty of Paying Living Expenses: Not hard at all   Food Insecurity: No Food Insecurity   Worried About Programme researcher, broadcasting/film/videounning Out of Food in the Last Year: Never true   Ran Out of Food in the Last Year: Never true  Transportation Needs: No Transportation Needs   Lack of Transportation (Medical): No   Lack of Transportation (Non-Medical): No  Physical Activity: Insufficiently Active   Days of Exercise per Week: 1 day   Minutes of Exercise per Session: 30 min  Stress: No Stress Concern Present   Feeling of Stress : Only a little  Social Connections: Not on file     Family History:  The patient's ***family history includes Diabetes in her mother; Healthy in her father.   ROS:   Please see the history of present illness.    ROS All other systems reviewed and are negative.   PHYSICAL EXAM:   VS:  There were no vitals taken for this visit.  Physical Exam  GEN: Well nourished, well developed, in no acute distress  HEENT: normal  Neck: no JVD, carotid bruits, or masses Cardiac:RRR; no murmurs, rubs, or gallops  Respiratory:  clear to auscultation bilaterally, normal work of breathing GI: soft, nontender, nondistended, + BS Ext: without cyanosis, clubbing, or edema, Good distal pulses bilaterally MS: no deformity or atrophy  Skin: warm and dry, no rash Neuro:  Alert and Oriented x 3, Strength and sensation are intact Psych: euthymic mood, full affect  Wt Readings from Last 3 Encounters:  05/13/21 181 lb (82.1 kg)  04/29/21 181 lb (82.1 kg)  04/24/21 181 lb 12.8 oz (82.5 kg)      Studies/Labs Reviewed:   EKG:  EKG is*** ordered today.  The ekg ordered today demonstrates ***  Recent Labs: 04/21/2021: ALT 20; BUN 14; Creatinine, Ser 0.93; Hemoglobin 10.8; Platelets 225; Potassium 3.8; Sodium 133 04/24/2021: TSH 1.850   Lipid Panel    Component Value Date/Time   CHOL 187 04/21/2021 0222   CHOL 164 10/31/2020 1157   TRIG 225 (H) 04/21/2021 0222   HDL 47 04/21/2021 0222   HDL 56 10/31/2020 1157   CHOLHDL 4.0 04/21/2021 0222   VLDL 45 (H)  04/21/2021 0222   LDLCALC 95 04/21/2021 0222   LDLCALC 78 10/31/2020 1157    Additional studies/ records that were reviewed today include:  CT coronary with FFR 04/21/21: IMPRESSION: 1. Coronary calcium score of 74. This was 80th percentile for age, sex, and race matched control.   2. Normal coronary origin with right dominance.   3. CAD-RADS 3. Moderate stenosis. Consider symptom-guided anti-ischemic pharmacotherapy as well as risk factor modification per guideline directed care. Additional analysis with CT FFR will be submitted.   1. Left Main: No significant functional stenosis, CT-FFR 0.99.   2. LAD: No significant functional stenosis, CT-FFR 0.83 distal vessel. 3. LCX: No significant functional stenosis, CT-FFR 0.97 and luminal narrowing. 4. RCA: No significant functional stenosis, CT-FFR 0.85 distal vessel.   IMPRESSION: 1. CT FFR analysis shows no evidence of significant functional stenosis. _____________   Echo 04/20/21: IMPRESSIONS     1. Left ventricular ejection fraction, by estimation, is 60 to 65%. The  left ventricle has normal function. The left ventricle has no regional  wall motion abnormalities. There is moderate asymmetric left ventricular  hypertrophy of the septal segment.  Left ventricular diastolic parameters are consistent with Grade I  diastolic dysfunction (impaired relaxation).   2. Right ventricular systolic function is normal. The right ventricular  size is normal.   3. The mitral valve is normal in structure. Trivial mitral valve  regurgitation. No evidence of mitral stenosis.   4. The aortic valve is tricuspid. Aortic valve regurgitation is not  visualized. Aortic valve sclerosis is present, with no evidence of aortic  valve stenosis.   5. The inferior vena cava is normal in size with greater than 50%  respiratory variability, suggesting right atrial pressure of 3 mmHg.   Comparison(s): No prior Echocardiogram.   Risk  Assessment/Calculations:   {Does this patient have ATRIAL FIBRILLATION?:[phone]}     ASSESSMENT:    No diagnosis found.   PLAN:  In order of problems listed above:  Chest pain/Nonobstructive CAD EKG with TWI.  CTA aorta protocol and CT coronary. CT was negative for dissection. CCTA with a coronary calcium score of 74 which was the 80th percentile. Images were sent for FFR analysis. FFR was not significant in the LAD, LCx, or RCA. May consider noncardiac causes for chest pain. Will continue low dose ASA. Statin changed to crestor  Continue low dose coreg and imdur.   Hypertensive urgency-echo with normal LVEF 60-65% mod LVH grade 1DD  Hyperlipidemia with LDL goal < 55 04/21/2021: Cholesterol 187; HDL 47; LDL Cholesterol 95; Triglycerides 225; VLDL 45 Pitavastatin changed to crestor Given DM, would prefer LDL less than 55.  DM2 A1c 5.9% On ozempic     Shared Decision Making/Informed Consent   {Are you ordering a CV Procedure (e.g. stress test, cath, DCCV, TEE, etc)?   Press F2        :[id number]}    Medication Adjustments/Labs and Tests Ordered: Current medicines are reviewed at length with the patient today.  Concerns regarding medicines are outlined above.  Medication changes, Labs and Tests ordered today are listed in the Patient Instructions below. There are no Patient Instructions on file for this visit.   Elson Clan, PA-C  05/20/2021 10:38 AM    [facility] [address] [place], [place], [place]  [address] Phone: [phone]; Fax: [phone]

## 2021-05-28 ENCOUNTER — Ambulatory Visit: Payer: Medicare PPO | Admitting: Physician Assistant

## 2021-05-28 DIAGNOSIS — E1165 Type 2 diabetes mellitus with hyperglycemia: Secondary | ICD-10-CM

## 2021-05-28 DIAGNOSIS — E785 Hyperlipidemia, unspecified: Secondary | ICD-10-CM

## 2021-05-28 DIAGNOSIS — I10 Essential (primary) hypertension: Secondary | ICD-10-CM

## 2021-05-28 DIAGNOSIS — I2510 Atherosclerotic heart disease of native coronary artery without angina pectoris: Secondary | ICD-10-CM

## 2021-06-18 ENCOUNTER — Telehealth: Payer: Self-pay

## 2021-06-18 NOTE — Chronic Care Management (AMB) (Cosign Needed)
[employer]ovo [employer] patient assistance program notification:  Patient enrolled in auto-refill, 120- day supply of Ozempic 1mg /ml will be filled on 07/11/2021 and  should arrive to the office in 10-14 business days. Patient enrollment will expire on 12/18/2021.  12/20/2021, CMA Clinical Pharmacist Assistant [phone]

## 2021-06-26 ENCOUNTER — Telehealth: Payer: Self-pay

## 2021-06-26 NOTE — Chronic Care Management (AMB) (Signed)
     Whitney Sanchez was reminded to have all medications, supplements and any blood glucose and blood pressure readings available for review with [name], Pharm. D, at her telephone visit on 07-01-2021 at 2:00   [facility] Clinical Pharmacist Assistant [phone]

## 2021-07-01 ENCOUNTER — Ambulatory Visit (INDEPENDENT_AMBULATORY_CARE_PROVIDER_SITE_OTHER): Payer: Medicare PPO

## 2021-07-01 DIAGNOSIS — E1165 Type 2 diabetes mellitus with hyperglycemia: Secondary | ICD-10-CM

## 2021-07-01 DIAGNOSIS — E7800 Pure hypercholesterolemia, unspecified: Secondary | ICD-10-CM

## 2021-07-01 DIAGNOSIS — I10 Essential (primary) hypertension: Secondary | ICD-10-CM

## 2021-07-01 NOTE — Progress Notes (Signed)
Chronic Care Management Pharmacy Note  07/02/2021 Name:  Whitney Sanchez MRN:  [id number] DOB:  1956-01-11  Summary: Patient reports that her medication needs to be sent through mail order, via [facility].  Recommendations/Changes made from today's visit: Patients rosuvastatin to be increased to 40 mg tablet Patient was switched to Valsartan 160 mg tablet, office visit will discuss with [facility] to fill her medications.   Plan: Patient is requesting the following medications: carvedilol 6.25 mg tablet, rosuvastatin, amlodipine 5 mg, isosorbide mononitrate 30 mg tablet, duloxetine 20 mg  Contacted [facility], she does not have current coverage with [facility]. Will notify patient of this change and request her insurance card be updated in her chart.  Patient to bring in new insurance card that she received in the mail to be placed on file.  Patient to continue to   Subjective: Whitney Sanchez is an 66 y.o. year old female who is a primary patient of Glendale Chard, MD.  The CCM team was consulted for assistance with disease management and care coordination needs.    Engaged with patient by telephone for follow up visit in response to provider referral for pharmacy case management and/or care coordination services.   Consent to Services:  The patient was given information about Chronic Care Management services, agreed to services, and gave verbal consent prior to initiation of services.  Please see initial visit note for detailed documentation.   Patient Care Team: Glendale Chard, MD as PCP - General (Internal Medicine) Werner Lean, MD as PCP - Cardiology (Cardiology) Mayford Knife, [facility] (Pharmacist)  Recent office visits: 04/24/2021 PCP [facility] visits: 04/20/2021 ED Hospital Admission   Objective:  Lab Results  Component Value Date   CREATININE 0.93 04/21/2021   BUN 14 04/21/2021   EGFR 78 02/05/2021   GFRNONAA >60 04/21/2021   GFRAA 80 02/23/2020   NA  133 (L) 04/21/2021   K 3.8 04/21/2021   CALCIUM 8.5 (L) 04/21/2021   CO2 18 (L) 04/21/2021   GLUCOSE 98 04/21/2021    Lab Results  Component Value Date/Time   HGBA1C 5.9 (H) 02/05/2021 11:28 AM   HGBA1C 5.9 (H) 10/31/2020 11:57 AM   HGBA1C 6.4 08/16/2017 12:00 AM   MICROALBUR 10 10/10/2018 04:56 PM    Last diabetic Eye exam:  Lab Results  Component Value Date/Time   HMDIABEYEEXA No Retinopathy 05/29/2020 12:00 AM    Last diabetic Foot exam: No results found for: "HMDIABFOOTEX"   Lab Results  Component Value Date   CHOL 187 04/21/2021   HDL 47 04/21/2021   LDLCALC 95 04/21/2021   TRIG 225 (H) 04/21/2021   CHOLHDL 4.0 04/21/2021       Latest Ref Rng & Units 04/21/2021    2:22 AM 10/31/2020   11:57 AM 02/23/2020   11:33 AM  Hepatic Function  Total Protein 6.5 - 8.1 g/dL 6.1  7.1  7.3   Albumin 3.5 - 5.0 g/dL 3.4  4.6  4.7   AST 15 - 41 U/L '24  19  20   ' ALT 0 - 44 U/L '20  15  22   ' Alk Phosphatase 38 - 126 U/L 47  74  83   Total Bilirubin 0.3 - 1.2 mg/dL 0.5  0.3  0.3   Bilirubin, Direct 0.0 - 0.2 mg/dL 0.1       Lab Results  Component Value Date/Time   TSH 1.850 04/24/2021 09:14 AM       Latest Ref  Rng & Units 04/21/2021    2:22 AM 04/20/2021    7:20 AM 09/27/2019   10:11 AM  CBC  WBC 4.0 - 10.5 K/uL 5.9  6.8  5.9   Hemoglobin 12.0 - 15.0 g/dL 10.8  12.0  12.1   Hematocrit 36.0 - 46.0 % 33.5  37.0  36.9   Platelets 150 - 400 K/uL 225  290  276     Lab Results  Component Value Date/Time   VD25OH 36.9 11/10/2016 12:00 AM    Clinical ASCVD: No  The 10-year ASCVD risk score (Arnett DK, et al., 2019) is: 17%   Values used to calculate the score:     Age: 52 years     Sex: Female     Is Non-Hispanic African American: Yes     Diabetic: Yes     Tobacco smoker: No     Systolic Blood Pressure: 646 mmHg     Is BP treated: Yes     HDL Cholesterol: 47 mg/dL     Total Cholesterol: 187 mg/dL       03/06/2021    3:32 PM 10/31/2020   11:22 AM 10/18/2019   11:01 AM   Depression screen PHQ 2/9  Decreased Interest 0 0 0  Down, Depressed, Hopeless 0 0 0  PHQ - 2 Score 0 0 0  Altered sleeping  0   Tired, decreased energy  0   Change in appetite  0   Feeling bad or failure about yourself   0   Trouble concentrating  0   Moving slowly or fidgety/restless  0   Suicidal thoughts  0   PHQ-9 Score  0      Social History   Tobacco Use  Smoking Status Never   Passive exposure: Never  Smokeless Tobacco Never   BP Readings from Last 3 Encounters:  05/13/21 110/80  04/29/21 (!) 164/92  04/24/21 140/90   Pulse Readings from Last 3 Encounters:  05/13/21 86  04/29/21 81  04/24/21 85   Wt Readings from Last 3 Encounters:  05/13/21 181 lb (82.1 kg)  04/29/21 181 lb (82.1 kg)  04/24/21 181 lb 12.8 oz (82.5 kg)   BMI Readings from Last 3 Encounters:  05/13/21 31.07 kg/m  04/29/21 31.07 kg/m  04/24/21 32.41 kg/m    Assessment/Interventions: Review of patient past medical history, allergies, medications, health status, including review of consultants reports, laboratory and other test data, was performed as part of comprehensive evaluation and provision of chronic care management services.   SDOH:  (Social Determinants of Health) assessments and interventions performed: No SDOH Interventions    Flowsheet Row Most Recent Value  SDOH Interventions   Financial Strain Interventions --  [Patient assistance application for Ozempic]      SDOH Screenings   Alcohol Screen: Not on file  Depression (PHQ2-9): Low Risk  (03/06/2021)   Depression (PHQ2-9)    PHQ-2 Score: 0  Financial Resource Strain: High Risk (07/01/2021)   Overall Financial Resource Strain (CARDIA)    Difficulty of Paying Living Expenses: Very hard  Food Insecurity: No Food Insecurity (03/06/2021)   Hunger Vital Sign    Worried About Running Out of Food in the Last Year: Never true    Ran Out of Food in the Last Year: Never true  Housing: Low Risk  (02/23/2020)   Housing    Last  Housing Risk Score: 0  Physical Activity: Insufficiently Active (03/06/2021)   Exercise Vital Sign    Days of Exercise per  Week: 1 day    Minutes of Exercise per Session: 30 min  Social Connections: Moderately Integrated (02/23/2020)   Social Connection and Isolation Panel [NHANES]    Frequency of Communication with Friends and Family: More than three times a week    Frequency of Social Gatherings with Friends and Family: Once a week    Attends Religious Services: More than 4 times per year    Active Member of Clubs or Organizations: No    Attends Archivist Meetings: Never    Marital Status: Married  Stress: No Stress Concern Present (03/06/2021)   [employer] [place]    Feeling of Stress : Only a little  Tobacco Use: Low Risk  (05/13/2021)   Patient History    Smoking Tobacco Use: Never    Smokeless Tobacco Use: Never    Passive Exposure: Never  Transportation Needs: No Transportation Needs (03/06/2021)   PRAPARE - Transportation    Lack of Transportation (Medical): No    Lack of Transportation (Non-Medical): No    CCM Care Plan  No Known Allergies  Medications Reviewed Today     Reviewed by Mayford Knife, RPH (Pharmacist) on 07/01/21 at 1433  Med List Status: <None>   Medication Order Taking? Sig Documenting Provider Last Dose Status Informant  amLODipine (NORVASC) 5 MG tablet [id number]  Take 1 tablet (5 mg total) by mouth at bedtime. Glendale Chard, MD  Active   aspirin EC 81 MG EC tablet [id number]  Take 1 tablet (81 mg total) by mouth daily. Swallow whole. Ledora Bottcher, PA  Active   blood glucose meter kit and supplies KIT [id number]  Dispense based on patient and insurance preference. Use up to four times daily as directed. Dx code e11.65 Glendale Chard, MD  Active   carvedilol (COREG) 6.25 MG tablet [id number] Yes Take 1 tablet (6.25 mg total) by mouth 2 (two) times daily with a meal. Glendale Chard,  MD  Active   DULoxetine (CYMBALTA) 20 MG capsule [id number]  TAKE 1 [place]  Patient taking differently: Take 20 mg by mouth daily.   Glendale Chard, MD  Active Self  isosorbide mononitrate (IMDUR) 30 MG 24 hr tablet [id number]  Take 1 tablet (30 mg total) by mouth daily. Glendale Chard, MD  Active   nitroGLYCERIN (NITROSTAT) 0.4 MG SL tablet [id number]  Place 1 tablet (0.4 mg total) under the tongue every 5 (five) minutes x 3 doses as needed for chest pain. Ledora Bottcher, PA  Active   rosuvastatin (CRESTOR) 20 MG tablet [id number]  Take 1 tablet (20 mg total) by mouth daily. Glendale Chard, MD  Active   Semaglutide, 1 MG/DOSE, (OZEMPIC, 1 MG/DOSE,) 4 MG/3ML Bon[facility] Aid [id number]  DIAL AND INJECT UNDER THE SKIN 1 MG WEEKLY  Patient taking differently: Inject 1 mg into the skin once a week.   Glendale Chard, MD  Active Self  valsartan (DIOVAN) 160 MG tablet [id number]  Take 1 tablet (160 mg total) by mouth daily.  Patient not taking: Reported on 05/13/2021   Glendale Chard, MD  Active             Patient Active Problem List   Diagnosis Date Noted   Hyperlipidemia LDL goal <70 04/21/2021   Hypertensive urgency 04/21/2021   DM (diabetes mellitus), type 2 ([place]) 04/21/2021   Chest pain 04/20/2021   Myalgia 11/02/2020   Arthralgia 11/02/2020   Numbness and tingling of hand 09/18/2020   Uncontrolled  type 2 diabetes mellitus with hyperglycemia ([place]) 03/17/2018   Essential hypertension, benign 03/17/2018   Pure hypercholesterolemia 03/17/2018   Class 1 obesity due to excess calories with serious comorbidity and body mass index (BMI) of 34.0 to 34.9 in adult 03/17/2018    Immunization History  Administered Date(s) Administered   Fluad Quad(high Dose 65+) 10/18/2019, 12/31/2020   Influenza-Unspecified 10/19/2017   Moderna Covid-19 Vaccine Bivalent Booster 62yr & up 01/14/2021   Moderna SARS-COV2 Booster Vaccination 11/17/2019   Moderna Sars-Covid-2 Vaccination 01/21/2019,  02/18/2019, 01/14/2021   PNEUMOCOCCAL CONJUGATE-20 02/18/2021   Pneumococcal Polysaccharide-23 02/05/2015    Conditions to be addressed/monitored:  Hypertension and Hyperlipidemia  Care Plan : [place] Updates made by [place]ayford Knife [place] 07/02/2021 12:00 AM     Problem: HTN, DM II   Priority: High     Goal: Disease Management   Recent Progress: On track  Priority: High  Note:    Current Barriers:  Unable to independently monitor therapeutic efficacy  Pharmacist Clinical Goal(s):  Patient will achieve adherence to monitoring guidelines and medication adherence to achieve therapeutic efficacy through collaboration with PharmD and provider.   Interventions: 1:1 collaboration with SGlendale Chard MD regarding development and update of comprehensive plan of care as evidenced by provider attestation and co-signature Inter-disciplinary care team collaboration (see longitudinal plan of care) Comprehensive medication review performed; medication list updated in electronic medical record  Hypertension (BP goal <130/80) -Controlled -Current treatment: Carvedilol 6.25 mg tablet twice per day. Appropriate, Effective, Safe, Accessible Amlodipine 5 mg tablet once per day at bedtime. Appropriate, Effective, Safe, Accessible Isosorbide mononitrate 30 mg tablet once per day Appropriate, Effective, Safe, Accessible Valsartan 160 mg tablet once per day. Appropriate, Effective, Safe, Accessible Losartan 100 mg tablet once per day. Appropriate, Effective, Safe, Accessible Patient is taking  -Current home readings: 142/85, 131/71 -Current exercise habits: she is walking about once per week.  -Denies hypotensive/hypertensive symptoms -Educated on Daily salt intake goal < 2300 mg; Importance of home blood pressure monitoring; Proper BP monitoring technique; -Counseled to monitor BP at home three times per week, document, and provide log at future  appointments -Recommended to continue current medication  Hyperlipidemia: (LDL goal < 70) -Not ideally controlled -Current treatment: Rosuvastatin 20 mg tablet once per day. Appropriate, Query effective, -Medications previously tried: Livalo -Educated on Cholesterol goals;  Benefits of statin for ASCVD risk reduction; Importance of limiting foods high in cholesterol; -Collaborate with PCP to increase patients rosuvastatin to 40 mg tablet daily  -Recommended to continue current medication   Diabetes (A1c goal <7%) -Controlled -Current medications: Ozempic 1 mg once per week on Tuesday Appropriate, Effective, Safe, Accessible -Current home glucose readings fasting glucose: 91, 108, 148,  -Denies hypoglycemic/hyperglycemic symptoms -Current meal patterns: her eating habits are good and she is not eating a lot  drinks: water with lemon  -Educated on A1c and blood sugar goals; Benefits of routine self-monitoring of blood sugar; -Counseled to check feet daily and get yearly eye exams -Recommended to continue current medication  Patient Goals/Self-Care Activities Patient will:  - continue to collaborate with provider on medication access solutions  Follow Up Plan: The patient has been provided with contact information for the care management team and has been advised to call with any health related questions or concerns.       Medication Assistance:  Ozempic obtained through [employer] assistance program.  Enrollment ends 11/2021  Compliance/Adherence/Medication fill history: Care Gaps: Foot Exam  COVID-19 Vaccine  booster - [place] [facility]   Star-Rating Drugs: Rosuvastatin 20 mg tablet  Ozempic 1 mg  Valsartan 160 mg tablet   Patient's [facility] pharmacy is:  [id number] [place] [place] - [address]Phone: [phone]: [phone] [facility] [place] [address]: [phone]: [phone] Uses pill box? Yes Pt endorses 90% compliance  We discussed: Benefits of medication synchronization, packaging and delivery as well as enhanced pharmacist oversight with [facility]. Patient decided to: Continue current medication management strategy  Care Plan and Follow Up Patient Decision:  Patient agrees to Care Plan and Follow-up.  Plan: The patient has been provided with contact information for the care management team and has been advised to call with any health related questions or concerns.   VOrlando Penner CPP, PharmD Clinical Pharmacist Practitioner [facility] [phone]

## 2021-07-02 MED ORDER — VALSARTAN 160 MG PO TABS: 160.0000 mg | ORAL_TABLET | Freq: Every day | ORAL | 2 refills | Status: DC

## 2021-07-02 MED ORDER — ROSUVASTATIN CALCIUM 20 MG PO TABS: 20.0000 mg | ORAL_TABLET | Freq: Every day | ORAL | 3 refills | Status: DC

## 2021-07-02 NOTE — Patient Instructions (Addendum)
Visit Information It was great speaking with you today!  Please let me know if you have any questions about our visit.   Goals Addressed             This Visit's Progress    Manage My Medicine       Timeframe:  Long-Range Goal Priority:  High Start Date:                             Expected End Date:                       Follow Up Date 12/30/2021   In Progress:  - call for medicine refill 2 or 3 days before it runs out - call if I am sick and can't take my medicine - keep a list of all the medicines I take; vitamins and herbals too - learn to read medicine labels - use a pillbox to sort medicine    Why is this important?   These steps will help you keep on track with your medicines.   Notes:  -Please make sure to check your mail for follow up from patient assistance for Livalo and Ozempic.         Patient Care Plan: CCM Pharmacy Care Plan     Problem Identified: HTN, DM II   Priority: High     Goal: Disease Management   Recent Progress: On track  Priority: High  Note:    Current Barriers:  Unable to independently monitor therapeutic efficacy  Pharmacist Clinical Goal(s):  Patient will achieve adherence to monitoring guidelines and medication adherence to achieve therapeutic efficacy through collaboration with PharmD and provider.   Interventions: 1:1 collaboration with [name], MD regarding development and update of comprehensive plan of care as evidenced by provider attestation and co-signature Inter-disciplinary care team collaboration (see longitudinal plan of care) Comprehensive medication review performed; medication list updated in electronic medical record  Hypertension (BP goal <130/80) -Controlled -Current treatment: Carvedilol 6.25 mg tablet twice per day. Appropriate, Effective, Safe, Accessible Amlodipine 5 mg tablet once per day at bedtime. Appropriate, Effective, Safe, Accessible Isosorbide mononitrate 30 mg tablet once per day  Appropriate, Effective, Safe, Accessible Valsartan 160 mg tablet once per day. Appropriate, Effective, Safe, Accessible Losartan 100 mg tablet once per day. Appropriate, Effective, Safe, Accessible Patient is taking  -Current home readings: 142/85, 131/71 -Current exercise habits: she is walking about once per week.  -Denies hypotensive/hypertensive symptoms -Educated on Daily salt intake goal < 2300 mg; Importance of home blood pressure monitoring; Proper BP monitoring technique; -Counseled to monitor BP at home three times per week, document, and provide log at future appointments -Recommended to continue current medication  Hyperlipidemia: (LDL goal < 70) -Not ideally controlled -Current treatment: Rosuvastatin 20 mg tablet once per day. Appropriate, Query effective, -Medications previously tried: Livalo -Educated on Cholesterol goals;  Benefits of statin for ASCVD risk reduction; Importance of limiting foods high in cholesterol; -Collaborate with PCP to increase patients rosuvastatin to 40 mg tablet daily  -Recommended to continue current medication   Diabetes (A1c goal <7%) -Controlled -Current medications: Ozempic 1 mg once per week on Tuesday Appropriate, Effective, Safe, Accessible -Current home glucose readings fasting glucose: 91, 108, 148,  -Denies hypoglycemic/hyperglycemic symptoms -Current meal patterns: her eating habits are good and she is not eating a lot  drinks: water with lemon  -Educated on A1c and blood sugar goals; Benefits  of routine self-monitoring of blood sugar; -Counseled to check feet daily and get yearly eye exams -Recommended to continue current medication  Patient Goals/Self-Care Activities Patient will:  - continue to collaborate with provider on medication access solutions  Follow Up Plan: The patient has been provided with contact information for the care management team and has been advised to call with any health related questions or  concerns.      Patient agreed to services and verbal consent obtained.   The patient verbalized understanding of instructions, educational materials, and care plan provided today and agreed to receive a mailed copy of patient instructions, educational materials, and care plan.   [name], PharmD Clinical Pharmacist [facility] [phone]

## 2021-07-04 ENCOUNTER — Telehealth: Payer: Self-pay

## 2021-07-04 NOTE — Chronic Care Management (AMB) (Unsigned)
    Chronic Care Management Pharmacy Assistant   Name: Whitney Sanchez  MRN: [id number] DOB: 03/27/1955  Reason for Encounter: [facility] onboard  Medications: Outpatient Encounter Medications as of 07/04/2021  Medication Sig   amLODipine (NORVASC) 5 MG tablet Take 1 tablet (5 mg total) by mouth at bedtime.   aspirin EC 81 MG EC tablet Take 1 tablet (81 mg total) by mouth daily. Swallow whole.   blood glucose meter kit and supplies KIT Dispense based on patient and insurance preference. Use up to four times daily as directed. Dx code e11.65   carvedilol (COREG) 6.25 MG tablet Take 1 tablet (6.25 mg total) by mouth 2 (two) times daily with a meal.   DULoxetine (CYMBALTA) 20 MG capsule TAKE 1 CAPSULE BY MOUTH EVERY DAY (Patient taking differently: Take 20 mg by mouth daily.)   isosorbide mononitrate (IMDUR) 30 MG 24 hr tablet Take 1 tablet (30 mg total) by mouth daily.   nitroGLYCERIN (NITROSTAT) 0.4 MG SL tablet Place 1 tablet (0.4 mg total) under the tongue every 5 (five) minutes x 3 doses as needed for chest pain.   rosuvastatin (CRESTOR) 20 MG tablet Take 1 tablet (20 mg total) by mouth daily.   Semaglutide, 1 MG/DOSE, (OZEMPIC, 1 MG/DOSE,) 4 MG/3ML SOPN DIAL AND INJECT UNDER THE SKIN 1 MG WEEKLY (Patient taking differently: Inject 1 mg into the skin once a week.)   valsartan (DIOVAN) 160 MG tablet Take 1 tablet (160 mg total) by mouth daily.   No facility-administered encounter medications on file as of 07/04/2021.    07-04-2021: Completed onboard form and benefits investigation. [facility] is in network.  Carvedilol 6.25 mg twice daily- Last [facility]21-2021 [facility]- Next fill due 07-18-2021 Cymbalta 20 mg daily- Last filled filled 04-14-2021 90 [facility]- Next fill 07-13-2021 Imdur 30 mg daily- Last filled 04-24-2021 90 DS- Next fill 07-23-2021 Crestor 20 mg daily- Last fill 04-21-2021 90 DS- Next fill 07-18-2021 Valsartan 160 mg daily- Never picked medication up. Start med after  finishing losartan. Amlodipine 5 mg daily- Last filled 04-29-2021 90 DS- Next fill 07-28-2021 Aspirin 81 mg- OTC  07-08-2021: Left patient a voicemail to call and clarify how many losartan pills are left.  [place] Pharmacist Assistant [phone]

## 2021-07-10 ENCOUNTER — Other Ambulatory Visit: Payer: Self-pay | Admitting: Internal Medicine

## 2021-07-10 ENCOUNTER — Other Ambulatory Visit: Payer: Self-pay

## 2021-07-10 MED ORDER — DULOXETINE HCL 20 MG PO CPEP
20.0000 mg | ORAL_CAPSULE | Freq: Every day | ORAL | 0 refills | Status: DC
Start: 2021-07-10 — End: 2021-07-10

## 2021-07-11 ENCOUNTER — Other Ambulatory Visit: Payer: Self-pay

## 2021-07-11 MED ORDER — DULOXETINE HCL 20 MG PO CPEP: ORAL_CAPSULE | ORAL | 2 refills | Status: DC

## 2021-07-17 ENCOUNTER — Other Ambulatory Visit: Payer: Self-pay

## 2021-07-17 MED ORDER — AMLODIPINE BESYLATE 5 MG PO TABS: 5.0000 mg | ORAL_TABLET | Freq: Every evening | ORAL | 1 refills | Status: DC

## 2021-07-17 MED ORDER — ISOSORBIDE MONONITRATE ER 30 MG PO TB24: 30.0000 mg | ORAL_TABLET | Freq: Every day | ORAL | 3 refills | Status: DC

## 2021-07-17 MED ORDER — DULOXETINE HCL 20 MG PO CPEP: ORAL_CAPSULE | ORAL | 2 refills | Status: DC

## 2021-07-17 MED ORDER — ROSUVASTATIN CALCIUM 20 MG PO TABS: 20.0000 mg | ORAL_TABLET | Freq: Every day | ORAL | 3 refills | Status: DC

## 2021-07-17 MED ORDER — ASPIRIN 81 MG PO TBEC: 81.0000 mg | DELAYED_RELEASE_TABLET | Freq: Every day | ORAL | 1 refills | Status: AC

## 2021-07-17 MED ORDER — NITROGLYCERIN 0.4 MG SL SUBL: SUBLINGUAL_TABLET | SUBLINGUAL | 3 refills | Status: DC

## 2021-07-17 MED ORDER — CARVEDILOL 6.25 MG PO TABS: 6.2500 mg | ORAL_TABLET | Freq: Two times a day (BID) | ORAL | 3 refills | Status: DC

## 2021-07-18 ENCOUNTER — Telehealth: Payer: Self-pay

## 2021-07-18 DIAGNOSIS — E1159 Type 2 diabetes mellitus with other circulatory complications: Secondary | ICD-10-CM

## 2021-07-18 DIAGNOSIS — I10 Essential (primary) hypertension: Secondary | ICD-10-CM | POA: Diagnosis not present

## 2021-07-18 DIAGNOSIS — Z7985 Long-term (current) use of injectable non-insulin antidiabetic drugs: Secondary | ICD-10-CM | POA: Diagnosis not present

## 2021-07-18 DIAGNOSIS — E785 Hyperlipidemia, unspecified: Secondary | ICD-10-CM

## 2021-07-18 MED ORDER — VALSARTAN 160 MG PO TABS: 160.0000 mg | ORAL_TABLET | Freq: Every day | ORAL | 2 refills | Status: DC

## 2021-07-18 NOTE — Telephone Encounter (Signed)
Valsartan 160 mg tablet prescription sent to patients preferred pharmacy.

## 2021-07-23 ENCOUNTER — Telehealth: Payer: Self-pay

## 2021-07-23 NOTE — Chronic Care Management (AMB) (Signed)
[employer] patient assistance program notification:  120- day supply of Ozempic 1 mg/ml was filled on 07/16/2021 and should arrive to the office in 10-14 business days. Patient has 1  refill remaining and enrollment will expire on 12/18/2021.  The next refill for patient will be fulfilled on 10/02/2021.  [name], CMA Clinical Pharmacist Assistant [phone]

## 2021-07-24 ENCOUNTER — Encounter: Payer: Medicare Other | Admitting: Internal Medicine

## 2021-07-31 ENCOUNTER — Telehealth: Payer: Self-pay

## 2021-07-31 NOTE — Chronic Care Management (AMB) (Signed)
07-31-2021: Patient informed that ozempic was delivered and ready for pick up. {[facility] will pick medication up form [facility] today.  [name] [facility] Clinical Pharmacist Assistant [phone]

## 2021-09-16 ENCOUNTER — Ambulatory Visit: Payer: Medicare PPO | Admitting: Internal Medicine

## 2021-09-16 ENCOUNTER — Encounter: Payer: Self-pay | Admitting: Internal Medicine

## 2021-09-16 ENCOUNTER — Ambulatory Visit (INDEPENDENT_AMBULATORY_CARE_PROVIDER_SITE_OTHER): Payer: Medicare PPO | Admitting: Internal Medicine

## 2021-09-16 VITALS — BP 122/80 | HR 74 | Temp 98.1°F | Ht 64.0 in | Wt 180.2 lb

## 2021-09-16 DIAGNOSIS — Z23 Encounter for immunization: Secondary | ICD-10-CM

## 2021-09-16 DIAGNOSIS — I119 Hypertensive heart disease without heart failure: Secondary | ICD-10-CM | POA: Diagnosis not present

## 2021-09-16 DIAGNOSIS — I2510 Atherosclerotic heart disease of native coronary artery without angina pectoris: Secondary | ICD-10-CM | POA: Diagnosis not present

## 2021-09-16 DIAGNOSIS — Z6832 Body mass index (BMI) 32.0-32.9, adult: Secondary | ICD-10-CM | POA: Diagnosis not present

## 2021-09-16 DIAGNOSIS — R0602 Shortness of breath: Secondary | ICD-10-CM

## 2021-09-16 DIAGNOSIS — E6609 Other obesity due to excess calories: Secondary | ICD-10-CM | POA: Diagnosis not present

## 2021-09-16 DIAGNOSIS — E1165 Type 2 diabetes mellitus with hyperglycemia: Secondary | ICD-10-CM | POA: Diagnosis not present

## 2021-09-16 DIAGNOSIS — R55 Syncope and collapse: Secondary | ICD-10-CM | POA: Diagnosis not present

## 2021-09-16 DIAGNOSIS — I700 Atherosclerosis of aorta: Secondary | ICD-10-CM | POA: Diagnosis not present

## 2021-09-16 NOTE — Patient Instructions (Signed)
Hypertension, Adult ?Hypertension is another name for high blood pressure. High blood pressure forces your heart to work harder to pump blood. This can cause problems over time. ?There are two numbers in a blood pressure reading. There is a top number (systolic) over a bottom number (diastolic). It is best to have a blood pressure that is below 120/80. ?What are the causes? ?The cause of this condition is not known. Some other conditions can lead to high blood pressure. ?What increases the risk? ?Some lifestyle factors can make you more likely to develop high blood pressure: ?Smoking. ?Not getting enough exercise or physical activity. ?Being overweight. ?Having too much fat, sugar, calories, or salt (sodium) in your diet. ?Drinking too much alcohol. ?Other risk factors include: ?Having any of these conditions: ?Heart disease. ?Diabetes. ?High cholesterol. ?Kidney disease. ?Obstructive sleep apnea. ?Having a family history of high blood pressure and high cholesterol. ?Age. The risk increases with age. ?Stress. ?What are the signs or symptoms? ?High blood pressure may not cause symptoms. Very high blood pressure (hypertensive crisis) may cause: ?Headache. ?Fast or uneven heartbeats (palpitations). ?Shortness of breath. ?Nosebleed. ?Vomiting or feeling like you may vomit (nauseous). ?Changes in how you see. ?Very bad chest pain. ?Feeling dizzy. ?Seizures. ?How is this treated? ?This condition is treated by making healthy lifestyle changes, such as: ?Eating healthy foods. ?Exercising more. ?Drinking less alcohol. ?Your doctor may prescribe medicine if lifestyle changes do not help enough and if: ?Your top number is above 130. ?Your bottom number is above 80. ?Your personal target blood pressure may vary. ?Follow these instructions at home: ?Eating and drinking ? ?If told, follow the DASH eating plan. To follow this plan: ?Fill one half of your plate at each meal with fruits and vegetables. ?Fill one fourth of your plate  at each meal with whole grains. Whole grains include whole-wheat pasta, brown rice, and whole-grain bread. ?Eat or drink low-fat dairy products, such as skim milk or low-fat yogurt. ?Fill one fourth of your plate at each meal with low-fat (lean) proteins. Low-fat proteins include fish, chicken without skin, eggs, beans, and tofu. ?Avoid fatty meat, cured and processed meat, or chicken with skin. ?Avoid pre-made or processed food. ?Limit the amount of salt in your diet to less than 1,500 mg each day. ?Do not drink alcohol if: ?Your doctor tells you not to drink. ?You are pregnant, may be pregnant, or are planning to become pregnant. ?If you drink alcohol: ?Limit how much you have to: ?0-1 drink a day for women. ?0-2 drinks a day for men. ?Know how much alcohol is in your drink. In the [place] one drink equals one 12 oz bottle of beer (355 mL), one 5 oz glass of wine (148 mL), or one 1? oz glass of hard liquor (44 mL). ?Lifestyle ? ?Work with your doctor to stay at a healthy weight or to lose weight. Ask your doctor what the best weight is for you. ?Get at least 30 minutes of exercise that causes your heart to beat faster (aerobic exercise) most days of the week. This may include walking, swimming, or biking. ?Get at least 30 minutes of exercise that strengthens your muscles (resistance exercise) at least 3 days a week. This may include lifting weights or doing Pilates. ?Do not smoke or use any products that contain nicotine or tobacco. If you need help quitting, ask your doctor. ?Check your blood pressure at home as told by your doctor. ?Keep all follow-up visits. ?Medicines ?Take over-the-counter and prescription medicines   only as told by your doctor. Follow directions carefully. ?Do not skip doses of blood pressure medicine. The medicine does not work as well if you skip doses. Skipping doses also puts you at risk for problems. ?Ask your doctor about side effects or reactions to medicines that you should watch  for. ?Contact a doctor if: ?You think you are having a reaction to the medicine you are taking. ?You have headaches that keep coming back. ?You feel dizzy. ?You have swelling in your ankles. ?You have trouble with your vision. ?Get help right away if: ?You get a very bad headache. ?You start to feel mixed up (confused). ?You feel weak or numb. ?You feel faint. ?You have very bad pain in your: ?Chest. ?Belly (abdomen). ?You vomit more than once. ?You have trouble breathing. ?These symptoms may be an emergency. Get help right away. Call 911. ?Do not wait to see if the symptoms will go away. ?Do not drive yourself to the hospital. ?Summary ?Hypertension is another name for high blood pressure. ?High blood pressure forces your heart to work harder to pump blood. ?For most people, a normal blood pressure is less than 120/80. ?Making healthy choices can help lower blood pressure. If your blood pressure does not get lower with healthy choices, you may need to take medicine. ?This information is not intended to replace advice given to you by your health care provider. Make sure you discuss any questions you have with your health care provider. ?Document Revised: 10/24/2020 Document Reviewed: 10/24/2020 ?Elsevier Patient Education ? 2023 Elsevier Inc[employer] ? ?

## 2021-09-16 NOTE — Progress Notes (Signed)
I,Whitney Sanchez,acting as a scribe for Whitney Greenland, MD.,have documented all relevant documentation on the behalf of Whitney Greenland, MD,as directed by  Whitney Greenland, MD while in the presence of Whitney Greenland, MD.    Subjective:     Patient ID: Whitney Sanchez , female    DOB: [birth year] , 66 y.o.   MRN: [id number]   Chief Complaint  Patient presents with   Hypertension   Diabetes    HPI  She presents today for diabetes/HTN f/u.  She reports compliance with meds.  She denies headaches, chest pain and palpitations. However, she does admit to shortness of breath. She states she sometimes has issues walking to her car from the store. No associated diaphoresis.   Hypertension This is a chronic problem. The current episode started more than 1 year ago. The problem has been gradually improving since onset. The problem is uncontrolled. Associated symptoms include shortness of breath. Pertinent negatives include no blurred vision, chest pain or palpitations. Risk factors for coronary artery disease include dyslipidemia and diabetes mellitus. The current treatment provides moderate improvement.  Diabetes She presents for her follow-up diabetic visit. She has type 2 diabetes mellitus. Her disease course has been stable. Hypoglycemia symptoms include dizziness. Pertinent negatives for diabetes include no blurred vision and no chest pain. There are no hypoglycemic complications. Risk factors for coronary artery disease include diabetes mellitus, dyslipidemia, hypertension, sedentary lifestyle, post-menopausal and obesity. She participates in exercise intermittently. An ACE inhibitor/angiotensin II receptor blocker is being taken.     Past Medical History:  Diagnosis Date   DM (diabetes mellitus) ([place])    High cholesterol    HTN (hypertension)      Family History  Problem Relation Age of Onset   Diabetes Mother    Healthy Father    Breast cancer Neg Hx    Neuropathy Neg Hx       Current Outpatient Medications:    amLODipine (NORVASC) 5 MG tablet, Take 1 tablet (5 mg total) by mouth at bedtime., Disp: 90 tablet, Rfl: 1   aspirin EC 81 MG tablet, Take 1 tablet (81 mg total) by mouth daily. Swallow whole., Disp: 90 tablet, Rfl: 1   blood glucose meter kit and supplies KIT, Dispense based on patient and insurance preference. Use up to four times daily as directed. Dx code e11.65, Disp: 1 each, Rfl: 4   carvedilol (COREG) 6.25 MG tablet, Take 1 tablet (6.25 mg total) by mouth 2 (two) times daily with a meal., Disp: 180 tablet, Rfl: 3   DULoxetine (CYMBALTA) 20 MG capsule, TAKE 1 CAPSULE BY MOUTH EVERY DAY, Disp: 30 capsule, Rfl: 2   isosorbide mononitrate (IMDUR) 30 MG 24 hr tablet, Take 1 tablet (30 mg total) by mouth daily., Disp: 90 tablet, Rfl: 3   nitroGLYCERIN (NITROSTAT) 0.4 MG SL tablet, Place 1 tablet (0.4 mg total) under the tongue every 5 (five) minutes x 3 doses as needed for chest pain., Disp: 25 tablet, Rfl: 3   rosuvastatin (CRESTOR) 20 MG tablet, Take 1 tablet (20 mg total) by mouth daily., Disp: 90 tablet, Rfl: 3   Semaglutide, 1 MG/DOSE, (OZEMPIC, 1 MG/DOSE,) 4 MG/3ML SOPN, DIAL AND INJECT UNDER THE SKIN 1 MG WEEKLY (Patient taking differently: Inject 1 mg into the skin once a week.), Disp: 6 mL, Rfl: 2   valsartan (DIOVAN) 160 MG tablet, Take 1 tablet (160 mg total) by mouth daily., Disp: 90 tablet, Rfl: 2   [DISCONTINUED] valsartan (DIOVAN) 160  MG tablet, Take 1 tablet (160 mg total) by mouth daily., Disp: 90 tablet, Rfl: 2   No Known Allergies   Review of Systems  Constitutional: Negative.   Eyes:  Negative for blurred vision.  Respiratory:  Positive for shortness of breath.   Cardiovascular: Negative.  Negative for chest pain and palpitations.  Neurological:  Positive for dizziness.       She reports she sometimes feels like she is going to pass out when walking. Not necessarily for exercise  - it can occur with just shopping. She does feel like  the room is spinning. She is not sure what triggers her sx, other than ambulation. Denies recent URI/ear pain.   Psychiatric/Behavioral: Negative.       Today's Vitals   09/16/21 0900 09/16/21 0918  BP: (!) 122/90 122/80  Pulse: 74   Temp: 98.1 F (36.7 C)   SpO2: 98%   Weight: 180 lb 3.2 oz (81.7 kg)   Height: '5\' 4"'  (1.626 m)   PainSc: 0-No pain    Body mass index is 30.93 kg/m.  Wt Readings from Last 3 Encounters:  09/16/21 180 lb 3.2 oz (81.7 kg)  05/13/21 181 lb (82.1 kg)  04/29/21 181 lb (82.1 kg)    Objective:  Physical Exam Vitals and nursing note reviewed.  Constitutional:      Appearance: Normal appearance. She is obese.  HENT:     Head: Normocephalic and atraumatic.     Right Ear: Tympanic membrane, ear canal and external ear normal. There is no impacted cerumen.     Left Ear: Tympanic membrane, ear canal and external ear normal. There is no impacted cerumen.  Eyes:     Extraocular Movements: Extraocular movements intact.  Cardiovascular:     Rate and Rhythm: Normal rate and regular rhythm.     Heart sounds: Normal heart sounds.  Pulmonary:     Effort: Pulmonary effort is normal.     Breath sounds: Normal breath sounds.  Musculoskeletal:     Cervical back: Normal range of motion.  Skin:    General: Skin is warm.  Neurological:     General: No focal deficit present.     Mental Status: She is alert.  Psychiatric:        Mood and Affect: Mood normal.        Behavior: Behavior normal.       Assessment And Plan:     1. Hypertensive heart disease without heart failure Comments: Chronic, well controlled. She will c/w carvedilol 6.16m bid, amlodipine 563mpo qd and valsartan 16086maily.  - CMP14+EGFR  2. Nonobstructive atherosclerosis of coronary artery Comments: Chronic, recent cardiac studies reviewed in full detail.  - Ambulatory referral to Cardiology  3. Uncontrolled type 2 diabetes mellitus with hyperglycemia (HCC) Comments: Chronic, I will check  labs as below. Importance of medication/dietary and office visit compliance was d/w patient. She will c/w Ozempic 1mg70mekly.  - Hemoglobin A1c  4. Shortness of breath Comments: Possibly releated to deconditioning; however, given underlying CAD will refer her to Cardiology for further evaluation. April 2023 CXR was normal.  - CBC - Ambulatory referral to Cardiology  5. Near syncope Comments: She is negative for orthostatics. I will check CT brain. She is in agreement with treatment plan. May benefit from Holter monitoring to identify arrhythmias.  - Vitamin B12 - CT HEAD WO CONTRAST (5MM); Future  6. Aortic atherosclerosis (HCC) Comments: Chronic, LDL goal is less than 70. She will c/w aspirin 81mg71m  daily and rosuvastatin 41m daily.  - Ambulatory referral to Cardiology - Lipid panel  7. Class 1 obesity due to excess calories with serious comorbidity and body mass index (BMI) of 32.0 to 32.9 in adult Comments: She is encouraged to strive for BMI<30 to decrease caridac risk, while aiming for at least 150 minutes of exercise per week.   8. Immunization due Comments: She was given Shingrix IM x 1, will be billed via [employer].  - Zoster Recombinant (Shingrix )   Patient was given opportunity to ask questions. Patient verbalized understanding of the plan and was able to repeat key elements of the plan. All questions were answered to their satisfaction.   I, RMaximino Greenland MD, have reviewed all documentation for this visit. The documentation on 09/16/21 for the exam, diagnosis, procedures, and orders are all accurate and complete.   IF YOU HAVE BEEN REFERRED TO A SPECIALIST, IT MAY TAKE 1-2 WEEKS TO SCHEDULE/PROCESS THE REFERRAL. IF YOU HAVE NOT HEARD FROM US/SPECIALIST IN TWO WEEKS, PLEASE GIVE [facility] CALL AT [phone].   THE PATIENT IS ENCOURAGED TO PRACTICE SOCIAL DISTANCING DUE TO THE COVID-19 PANDEMIC.

## 2021-09-17 LAB — CMP14+EGFR
ALT: 12 IU/L (ref 0–32)
AST: 18 IU/L (ref 0–40)
Albumin/Globulin Ratio: 1.8 (ref 1.2–2.2)
Albumin: 4.5 g/dL (ref 3.9–4.9)
Alkaline Phosphatase: 71 IU/L (ref 44–121)
BUN/Creatinine Ratio: 14 (ref 12–28)
BUN: 10 mg/dL (ref 8–27)
Bilirubin Total: 0.4 mg/dL (ref 0.0–1.2)
CO2: 24 mmol/L (ref 20–29)
Calcium: 9.5 mg/dL (ref 8.7–10.3)
Chloride: 105 mmol/L (ref 96–106)
Creatinine, Ser: 0.72 mg/dL (ref 0.57–1.00)
Globulin, Total: 2.5 g/dL (ref 1.5–4.5)
Glucose: 102 mg/dL — ABNORMAL HIGH (ref 70–99)
Potassium: 4.6 mmol/L (ref 3.5–5.2)
Sodium: 141 mmol/L (ref 134–144)
Total Protein: 7 g/dL (ref 6.0–8.5)
eGFR: 91 mL/min/{1.73_m2} (ref >59)

## 2021-09-17 LAB — CBC
Hematocrit: 36.3 % (ref 34.0–46.6)
Hemoglobin: 11.2 g/dL (ref 11.1–15.9)
MCH: 28 pg (ref 26.6–33.0)
MCHC: 30.9 g/dL — ABNORMAL LOW (ref 31.5–35.7)
MCV: 91 fL (ref 79–97)
Platelets: 259 10*3/uL (ref 150–450)
RBC: 4 x10E6/uL (ref 3.77–5.28)
RDW: 13.7 % (ref 11.7–15.4)
WBC: 6.4 10*3/uL (ref 3.4–10.8)

## 2021-09-17 LAB — LIPID PANEL
Chol/HDL Ratio: 2.5 ratio (ref 0.0–4.4)
Cholesterol, Total: 145 mg/dL (ref 100–199)
HDL: 59 mg/dL (ref >39)
LDL Chol Calc (NIH): 61 mg/dL (ref 0–99)
Triglycerides: 150 mg/dL — ABNORMAL HIGH (ref 0–149)
VLDL Cholesterol Cal: 25 mg/dL (ref 5–40)

## 2021-09-17 LAB — VITAMIN B12: Vitamin B-12: 858 pg/mL (ref 232–1245)

## 2021-09-17 LAB — HEMOGLOBIN A1C
Est. average glucose Bld gHb Est-mCnc: 120 mg/dL
Hgb A1c MFr Bld: 5.8 % — ABNORMAL HIGH (ref 4.8–5.6)

## 2021-09-25 ENCOUNTER — Telehealth: Payer: Self-pay

## 2021-09-25 NOTE — Chronic Care Management (AMB) (Signed)
  Chronic Care Management Pharmacy Assistant   Name: Elaiza W Kroon  MRN: [id number] DOB: 03/31/1955  Reason for Encounter: Medication Review/ Medication coordination  Recent office visits:  09-16-2021 Sanders, Robyn, MD. MCHC= 30.9. Glucose= 102. A1C= 5.8. Trig= 150. Referral placed to cardiology. CT HEAD WO CONTRAST ordered. Shingrix given.   Recent consult visits:  None  Hospital visits:  Medication Reconciliation was completed by comparing discharge summary, patient's EMR and Pharmacy list, and upon discussion with patient.   Admitted to the hospital on 04-02-023 due to chest pain. Discharge date was 04-21-2021. Discharged from [facility].     New?Medications Started at Hospital Discharge:?? Aspirin 81 mg daily Carvedilol 6.25 mg twice daily Isosorbide mononitrate 30 mg daily Nitroglycerin 0.4 mg under the tongue every 5 (five) minutes x 3 doses as needed for chest pain. Rosuvastatin 20 mg daily   Medication Changes at Hospital Discharge: None   Medications Discontinued at Hospital Discharge: Livalo   Medications that remain the same after Hospital Discharge:??  -All other medications will remain the same.    Medications: Outpatient Encounter Medications as of 09/25/2021  Medication Sig   amLODipine (NORVASC) 5 MG tablet Take 1 tablet (5 mg total) by mouth at bedtime.   aspirin EC 81 MG tablet Take 1 tablet (81 mg total) by mouth daily. Swallow whole.   blood glucose meter kit and supplies KIT Dispense based on patient and insurance preference. Use up to four times daily as directed. Dx code e11.65   carvedilol (COREG) 6.25 MG tablet Take 1 tablet (6.25 mg total) by mouth 2 (two) times daily with a meal.   DULoxetine (CYMBALTA) 20 MG capsule TAKE 1 CAPSULE BY MOUTH EVERY DAY   isosorbide mononitrate (IMDUR) 30 MG 24 hr tablet Take 1 tablet (30 mg total) by mouth daily.   nitroGLYCERIN (NITROSTAT) 0.4 MG SL tablet Place 1 tablet (0.4 mg total) under the  tongue every 5 (five) minutes x 3 doses as needed for chest pain.   rosuvastatin (CRESTOR) 20 MG tablet Take 1 tablet (20 mg total) by mouth daily.   Semaglutide, 1 MG/DOSE, (OZEMPIC, 1 MG/DOSE,) 4 MG/3ML SOPN DIAL AND INJECT UNDER THE SKIN 1 MG WEEKLY (Patient taking differently: Inject 1 mg into the skin once a week.)   valsartan (DIOVAN) 160 MG tablet Take 1 tablet (160 mg total) by mouth daily.   [DISCONTINUED] valsartan (DIOVAN) 160 MG tablet Take 1 tablet (160 mg total) by mouth daily.   No facility-administered encounter medications on file as of 09/25/2021.  Reviewed chart for medication changes ahead of medication coordination call.  No Consults, or hospital visits since last care coordination call/Pharmacist visit.   No medication changes indicated   BP Readings from Last 3 Encounters:  09/16/21 122/80  05/13/21 110/80  04/29/21 (!) 164/92    Lab Results  Component Value Date   HGBA1C 5.8 (H) 09/16/2021     Patient obtains medications through Adherence Packaging  90 Days   Last adherence delivery included:  None- patient's first delivery  Patient declined (meds) last month: None- patient's first delivery  Patient is due for next adherence delivery on: 10-07-2021  Called patient and reviewed medications and coordinated delivery.  This delivery to include: Aspirin 81 mg at breakfast Carvedilol 6.25 mg at breakfast and with evening meal Duloxetine 20 mg at breakfast Imdur 30 mg at breakfast Rosuvastatin 20 mg at breakfast Valsartan 160 mg at breakfast Amlodipine 5 mg at bedtime  No acute/short fill needed    Patient declined the following medications: None  Patient needs refills for: None  Confirmed delivery date of  10-07-2021 advised patient that pharmacy will contact them the morning of delivery.   Care Gaps: Yearly foot exam overdue Covid booster overdue Yearly ophthalmology overdue Flu vaccine overdue  Star Rating Drugs: Ozempic 1 mg- Patient  assistance Valsartan 160 mg- Last filled 07-02-2021 90 [facility] Rosuvastatin 20 mg- Last filled 08-06-2021 62 DS [facility]  [name] CMA Clinical Pharmacist Assistant [phone]  

## 2021-09-30 ENCOUNTER — Telehealth: Payer: Self-pay

## 2021-09-30 NOTE — Chronic Care Management (AMB) (Signed)
[employer] patient assistance program notification:  120- day supply of Ozempic 1 mg will be filled on 10/17/2021 and should arrive to the office in 10-14 business days.    [name], CMA Clinical Pharmacist Assistant [phone]

## 2021-10-10 ENCOUNTER — Ambulatory Visit
Admission: RE | Admit: 2021-10-10 | Discharge: 2021-10-10 | Disposition: A | Discharge status: Home / Self Care | Payer: Medicare PPO | Source: Ambulatory Visit | Attending: Internal Medicine | Admitting: Internal Medicine

## 2021-10-10 DIAGNOSIS — R42 Dizziness and giddiness: Secondary | ICD-10-CM | POA: Diagnosis not present

## 2021-10-10 DIAGNOSIS — R55 Syncope and collapse: Secondary | ICD-10-CM

## 2021-11-05 ENCOUNTER — Telehealth: Payer: Self-pay

## 2021-11-05 NOTE — Chronic Care Management (AMB) (Signed)
[employer] patient assistance program notification:  120- day supply of Ozempic 1mg /mll was filled on 10/22/2021 and should arrive to the office in 10-14 business days. Patient has 0  refill remaining and enrollment will expire on 01/18/2022.  No further action required, patient due for 2024 re-enrollment.    [name], [place] Pharmacist Assistant [phone]

## 2021-12-01 ENCOUNTER — Ambulatory Visit (INDEPENDENT_AMBULATORY_CARE_PROVIDER_SITE_OTHER): Payer: Medicare PPO | Admitting: Internal Medicine

## 2021-12-01 ENCOUNTER — Encounter: Payer: Self-pay | Admitting: Internal Medicine

## 2021-12-01 VITALS — BP 136/72 | HR 89 | Temp 98.5°F | Ht 64.0 in | Wt 174.8 lb

## 2021-12-01 DIAGNOSIS — E785 Hyperlipidemia, unspecified: Secondary | ICD-10-CM | POA: Diagnosis not present

## 2021-12-01 DIAGNOSIS — I119 Hypertensive heart disease without heart failure: Secondary | ICD-10-CM

## 2021-12-01 DIAGNOSIS — E6609 Other obesity due to excess calories: Secondary | ICD-10-CM

## 2021-12-01 DIAGNOSIS — R002 Palpitations: Secondary | ICD-10-CM | POA: Diagnosis not present

## 2021-12-01 DIAGNOSIS — G8929 Other chronic pain: Secondary | ICD-10-CM

## 2021-12-01 DIAGNOSIS — E1165 Type 2 diabetes mellitus with hyperglycemia: Secondary | ICD-10-CM | POA: Diagnosis not present

## 2021-12-01 DIAGNOSIS — F419 Anxiety disorder, unspecified: Secondary | ICD-10-CM | POA: Diagnosis not present

## 2021-12-01 DIAGNOSIS — E2839 Other primary ovarian failure: Secondary | ICD-10-CM

## 2021-12-01 DIAGNOSIS — Z0000 Encounter for general adult medical examination without abnormal findings: Secondary | ICD-10-CM

## 2021-12-01 DIAGNOSIS — Z23 Encounter for immunization: Secondary | ICD-10-CM | POA: Diagnosis not present

## 2021-12-01 DIAGNOSIS — E1169 Type 2 diabetes mellitus with other specified complication: Secondary | ICD-10-CM

## 2021-12-01 DIAGNOSIS — M25511 Pain in right shoulder: Secondary | ICD-10-CM | POA: Diagnosis not present

## 2021-12-01 DIAGNOSIS — I10 Essential (primary) hypertension: Secondary | ICD-10-CM

## 2021-12-01 DIAGNOSIS — Z6830 Body mass index (BMI) 30.0-30.9, adult: Secondary | ICD-10-CM

## 2021-12-01 DIAGNOSIS — E7800 Pure hypercholesterolemia, unspecified: Secondary | ICD-10-CM

## 2021-12-01 DIAGNOSIS — Z1231 Encounter for screening mammogram for malignant neoplasm of breast: Secondary | ICD-10-CM

## 2021-12-01 LAB — POCT URINALYSIS DIPSTICK
Blood, UA: NEGATIVE
Glucose, UA: NEGATIVE
Leukocytes, UA: NEGATIVE
Nitrite, UA: NEGATIVE
Protein, UA: POSITIVE — AB
Spec Grav, UA: 1.025 (ref 1.010–1.025)
Urobilinogen, UA: 1 E.U./dL
pH, UA: 5.5 (ref 5.0–8.0)

## 2021-12-01 MED ORDER — DULOXETINE HCL 30 MG PO CPEP: 30.0000 mg | ORAL_CAPSULE | Freq: Every day | ORAL | 2 refills | Status: DC

## 2021-12-01 NOTE — Progress Notes (Unsigned)
Whitney Sanchez,acting as a Education administrator for Whitney Greenland, MD.,have documented all relevant documentation on the behalf of Whitney Greenland, MD,as directed by  Whitney Greenland, MD while in the presence of Whitney Greenland, MD.   Subjective:     Patient ID: Whitney Sanchez , female    DOB: 1955/05/16 , 66 y.o.   MRN: [id number]   Chief Complaint  Patient presents with   Annual Exam   Diabetes   Hypertension    HPI  Patient presents today for HM.  She is no longer followed by GYN. Patient reports compliance with medications and has no other issues. She denies having any headaches, chest pain and shortness of breath. However, she does complain of palpitations. She is unable to state what triggers her sx. They usually occur at rest.   BP Readings from Last 3 Encounters: 12/01/21 : 136/72 09/16/21 : 122/80 05/13/21 : 110/80    Diabetes She presents for her follow-up diabetic visit. She has type 2 diabetes mellitus. Pertinent negatives for diabetes include no blurred vision and no chest pain. There are no hypoglycemic complications. Risk factors for coronary artery disease include diabetes mellitus, dyslipidemia, hypertension, sedentary lifestyle, post-menopausal and obesity. An ACE inhibitor/angiotensin II receptor blocker is being taken.  Hypertension This is a chronic problem. The current episode started more than 1 year ago. The problem has been gradually improving since onset. The problem is uncontrolled. Pertinent negatives include no blurred vision or chest pain. Risk factors for coronary artery disease include dyslipidemia and diabetes mellitus. The current treatment provides moderate improvement.     Past Medical History:  Diagnosis Date   DM (diabetes mellitus) ([place])    High cholesterol    HTN (hypertension)      Family History  Problem Relation Age of Onset   Diabetes Mother    Healthy Father    Breast cancer Neg Hx    Neuropathy Neg Hx      Current Outpatient  Medications:    amLODipine (NORVASC) 5 MG tablet, Take 1 tablet (5 mg total) by mouth at bedtime., Disp: 90 tablet, Rfl: 1   aspirin EC 81 MG tablet, Take 1 tablet (81 mg total) by mouth daily. Swallow whole., Disp: 90 tablet, Rfl: 1   blood glucose meter kit and supplies KIT, Dispense based on patient and insurance preference. Use up to four times daily as directed. Dx code e11.65, Disp: 1 each, Rfl: 4   carvedilol (COREG) 6.25 MG tablet, Take 1 tablet (6.25 mg total) by mouth 2 (two) times daily with a meal., Disp: 180 tablet, Rfl: 3   DULoxetine (CYMBALTA) 30 MG capsule, Take 1 capsule (30 mg total) by mouth daily., Disp: 30 capsule, Rfl: 2   isosorbide mononitrate (IMDUR) 30 MG 24 hr tablet, Take 1 tablet (30 mg total) by mouth daily., Disp: 90 tablet, Rfl: 3   nitroGLYCERIN (NITROSTAT) 0.4 MG SL tablet, Place 1 tablet (0.4 mg total) under the tongue every 5 (five) minutes x 3 doses as needed for chest pain., Disp: 25 tablet, Rfl: 3   rosuvastatin (CRESTOR) 20 MG tablet, Take 1 tablet (20 mg total) by mouth daily., Disp: 90 tablet, Rfl: 3   Semaglutide, 1 MG/DOSE, (OZEMPIC, 1 MG/DOSE,) 4 MG/3ML SOPN, DIAL AND INJECT UNDER THE SKIN 1 MG WEEKLY (Patient taking differently: Inject 1 mg into the skin once a week.), Disp: 6 mL, Rfl: 2   valsartan (DIOVAN) 160 MG tablet, Take 1 tablet (160 mg total) by mouth daily.,  Disp: 90 tablet, Rfl: 2   [DISCONTINUED] valsartan (DIOVAN) 160 MG tablet, Take 1 tablet (160 mg total) by mouth daily., Disp: 90 tablet, Rfl: 2   No Known Allergies    The patient states she uses status post hysterectomy for birth control. Last LMP was No LMP recorded. Patient has had a hysterectomy.. Negative for Dysmenorrhea. Negative for: breast discharge, breast lump(s), breast pain and breast self exam. Associated symptoms include abnormal vaginal bleeding. Pertinent negatives include abnormal bleeding (hematology), anxiety, decreased libido, depression, difficulty falling sleep,  dyspareunia, history of infertility, nocturia, sexual dysfunction, sleep disturbances, urinary incontinence, urinary urgency, vaginal discharge and vaginal itching. Diet regular.The patient states her exercise level is  intermittent.   . The patient's tobacco use is:  Social History   Tobacco Use  Smoking Status Never   Passive exposure: Never  Smokeless Tobacco Never  . She has been exposed to passive smoke. The patient's alcohol use is:  Social History   Substance and Sexual Activity  Alcohol Use Never   Review of Systems  Constitutional: Negative.   HENT: Negative.    Eyes: Negative.  Negative for blurred vision.  Respiratory: Negative.    Cardiovascular: Negative.  Negative for chest pain.  Gastrointestinal: Negative.   Endocrine: Negative.   Genitourinary: Negative.   Musculoskeletal: Negative.   Skin: Negative.   Allergic/Immunologic: Negative.   Neurological: Negative.   Hematological: Negative.   Psychiatric/Behavioral: Negative.       Today's Vitals   12/01/21 0919  BP: 136/72  Pulse: 89  Temp: 98.5 F (36.9 C)  TempSrc: Oral  Weight: 174 lb 12.8 oz (79.3 kg)  Height: _0  (1.626 m)  PainSc: 3   PainLoc: Arm   Body mass index is 30 kg/m.  Wt Readings from Last 3 Encounters:  12/01/21 174 lb 12.8 oz (79.3 kg)  09/16/21 180 lb 3.2 oz (81.7 kg)  05/13/21 181 lb (82.1 kg)    Objective:  Physical Exam Constitutional:      General: She is not in acute distress.    Appearance: Normal appearance. She is well-developed. She is obese.  HENT:     Head: Normocephalic and atraumatic.     Right Ear: Hearing, tympanic membrane, ear canal and external ear normal. There is no impacted cerumen.     Left Ear: Hearing, tympanic membrane, ear canal and external ear normal. There is no impacted cerumen.     Nose:     Comments: Deferred - masked    Mouth/Throat:     Comments: Deferred - masked Eyes:     General: Lids are normal.     Extraocular Movements:  Extraocular movements intact.     Conjunctiva/sclera: Conjunctivae normal.     Pupils: Pupils are equal, round, and reactive to light.     Funduscopic exam:    Right eye: No papilledema.        Left eye: No papilledema.  Neck:     Thyroid: No thyroid mass.     Vascular: No carotid bruit.  Cardiovascular:     Rate and Rhythm: Normal rate and regular rhythm.     Pulses: Normal pulses.     Heart sounds: Normal heart sounds. No murmur heard. Pulmonary:     Effort: Pulmonary effort is normal.     Breath sounds: Normal breath sounds.  Chest:     Chest wall: No mass.  Breasts:    Tanner Score is 5.     Right: Normal. No mass or tenderness.  Left: Normal. No mass or tenderness.  Abdominal:     General: Abdomen is flat. Bowel sounds are normal. There is no distension.     Palpations: Abdomen is soft.     Tenderness: There is no abdominal tenderness.  Musculoskeletal:        General: No swelling. Normal range of motion.     Cervical back: Full passive range of motion without pain, normal range of motion and neck supple.     Right lower leg: No edema.     Left lower leg: No edema.  Lymphadenopathy:     Upper Body:     Right upper body: No supraclavicular, axillary or pectoral adenopathy.     Left upper body: No supraclavicular, axillary or pectoral adenopathy.  Skin:    General: Skin is warm and dry.     Capillary Refill: Capillary refill takes less than 2 seconds.  Neurological:     General: No focal deficit present.     Mental Status: She is alert and oriented to person, place, and time.     Cranial Nerves: No cranial nerve deficit.     Sensory: No sensory deficit.  Psychiatric:        Mood and Affect: Mood normal.        Behavior: Behavior normal.        Thought Content: Thought content normal.        Judgment: Judgment normal.         Assessment And Plan:     1. Annual physical exam Comments: A full exam was performed. Importance of monthly self breast exams was  discussed with the patient.  2. Uncontrolled type 2 diabetes mellitus with hyperglycemia (HCC) - Microalbumin / Creatinine Urine Ratio - POCT Urinalysis Dipstick (81002) - CMP14+EGFR - Hemoglobin A1c  3. Hypertensive heart disease without heart failure Comments: Chronic, fair control. EKG performed, NSR w/ incomplete RBBB. - EKG 12-Lead - CBC no Diff  4. Chronic right shoulder pain  5. Essential hypertension, benign  6. Class 1 obesity due to excess calories with serious comorbidity and body mass index (BMI) of 32.0 to 32.9 in adult  7. Pure hypercholesterolemia  8. Need for influenza vaccination - Flu Vaccine QUAD High Dose(Fluad)  9. Palpitations - Magnesium  10. Anxiety - DULoxetine (CYMBALTA) 30 MG capsule; Take 1 capsule (30 mg total) by mouth daily.  Dispense: 30 capsule; Refill: 2   Patient was given opportunity to ask questions. Patient verbalized understanding of the plan and was able to repeat key elements of the plan. All questions were answered to their satisfaction.   I, Whitney Greenland, MD, have reviewed all documentation for this visit. The documentation on 12/01/21 for the exam, diagnosis, procedures, and orders are all accurate and complete.  THE PATIENT IS ENCOURAGED TO PRACTICE SOCIAL DISTANCING DUE TO THE COVID-19 PANDEMIC.

## 2021-12-01 NOTE — Patient Instructions (Signed)

## 2021-12-02 LAB — CMP14+EGFR
ALT: 9 IU/L (ref 0–32)
AST: 19 IU/L (ref 0–40)
Albumin/Globulin Ratio: 1.8 (ref 1.2–2.2)
Albumin: 4.7 g/dL (ref 3.9–4.9)
Alkaline Phosphatase: 75 IU/L (ref 44–121)
BUN/Creatinine Ratio: 21 (ref 12–28)
BUN: 19 mg/dL (ref 8–27)
Bilirubin Total: 0.5 mg/dL (ref 0.0–1.2)
CO2: 23 mmol/L (ref 20–29)
Calcium: 9.8 mg/dL (ref 8.7–10.3)
Chloride: 102 mmol/L (ref 96–106)
Creatinine, Ser: 0.92 mg/dL (ref 0.57–1.00)
Globulin, Total: 2.6 g/dL (ref 1.5–4.5)
Glucose: 100 mg/dL — ABNORMAL HIGH (ref 70–99)
Potassium: 4.8 mmol/L (ref 3.5–5.2)
Sodium: 139 mmol/L (ref 134–144)
Total Protein: 7.3 g/dL (ref 6.0–8.5)
eGFR: 68 mL/min/{1.73_m2} (ref >59)

## 2021-12-02 LAB — CBC
Hematocrit: 38.4 % (ref 34.0–46.6)
Hemoglobin: 11.7 g/dL (ref 11.1–15.9)
MCH: 27 pg (ref 26.6–33.0)
MCHC: 30.5 g/dL — ABNORMAL LOW (ref 31.5–35.7)
MCV: 89 fL (ref 79–97)
Platelets: 310 10*3/uL (ref 150–450)
RBC: 4.34 x10E6/uL (ref 3.77–5.28)
RDW: 14 % (ref 11.7–15.4)
WBC: 6.4 10*3/uL (ref 3.4–10.8)

## 2021-12-02 LAB — MICROALBUMIN / CREATININE URINE RATIO
Creatinine, Urine: 169.2 mg/dL
Microalb/Creat Ratio: 19 mg/g creat (ref 0–29)
Microalbumin, Urine: 32.8 ug/mL

## 2021-12-02 LAB — HEMOGLOBIN A1C
Est. average glucose Bld gHb Est-mCnc: 120 mg/dL
Hgb A1c MFr Bld: 5.8 % — ABNORMAL HIGH (ref 4.8–5.6)

## 2021-12-02 LAB — MAGNESIUM: Magnesium: 2.1 mg/dL (ref 1.6–2.3)

## 2021-12-03 DIAGNOSIS — G8929 Other chronic pain: Secondary | ICD-10-CM | POA: Insufficient documentation

## 2021-12-03 DIAGNOSIS — I119 Hypertensive heart disease without heart failure: Secondary | ICD-10-CM | POA: Insufficient documentation

## 2021-12-08 ENCOUNTER — Ambulatory Visit: Payer: Medicare PPO | Admitting: Internal Medicine

## 2021-12-08 ENCOUNTER — Encounter: Payer: Self-pay | Admitting: Internal Medicine

## 2021-12-08 VITALS — BP 120/74 | HR 83 | Ht 64.0 in | Wt 175.4 lb

## 2021-12-08 DIAGNOSIS — R0609 Other forms of dyspnea: Secondary | ICD-10-CM | POA: Insufficient documentation

## 2021-12-08 DIAGNOSIS — E782 Mixed hyperlipidemia: Secondary | ICD-10-CM

## 2021-12-08 DIAGNOSIS — R002 Palpitations: Secondary | ICD-10-CM

## 2021-12-08 DIAGNOSIS — R0602 Shortness of breath: Secondary | ICD-10-CM | POA: Insufficient documentation

## 2021-12-08 DIAGNOSIS — I10 Essential (primary) hypertension: Secondary | ICD-10-CM | POA: Diagnosis not present

## 2021-12-08 NOTE — Progress Notes (Unsigned)
Primary Physician/Referring:  Glendale Chard, MD  Patient ID: Whitney Sanchez, female    DOB: [birth year], 66 y.o.   MRN: [id number]  Chief Complaint  Patient presents with   aortic atherosclerosis   Shortness of Breath   New Patient (Initial Visit)        HPI:    Whitney Sanchez  is a 66 y.o. female with past medical history significant for hypertension, diabetes, and hyperlipidemia who is here to establish care with cardiology.  Patient has been complaining of palpitations recently.  Additionally she had been having chest pain and her PCP sent her for coronary calcium score.  Her score was then sent for FFR, none of her major arteries were significant for flow-limiting stenosis.  Her echocardiogram was also within normal limits.  It appears that her chest pain was not due to coronary artery disease.  Patient comes to me with persistent palpitations.  During our conversation she realizes that she has been taking her blood pressure pills with her morning coffee and she does not drink any water until later in the day.  She realizes that her palpitations and chest pain happened right after she does this and she should probably follow the instructions on her pill bottles that state to take her blood pressure pills with a full glass of water.  Patient had extensive work-up for this and no underlying cardiac disease was found.  It is likely dehydration and taking her pills with caffeine that has been causing this.  Otherwise patient denies shortness of breath, diaphoresis, syncope, edema, claudication, PND, orthopnea.  Past Medical History:  Diagnosis Date   DM (diabetes mellitus) ([place])    High cholesterol    HTN (hypertension)    Past Surgical History:  Procedure Laterality Date   ABDOMINAL HYSTERECTOMY  1996   total   Family History  Problem Relation Age of Onset   Diabetes Mother    Healthy Father    Breast cancer Neg Hx    Neuropathy Neg Hx     Social History   Tobacco Use    Smoking status: Never    Passive exposure: Never   Smokeless tobacco: Never  Substance Use Topics   Alcohol use: Never   Marital Status: Married  ROS  Review of Systems  Cardiovascular:  Positive for irregular heartbeat and palpitations.   Objective  Blood pressure 120/74, pulse 83, height _0  (1.626 m), weight 175 lb 6.4 oz (79.6 kg), SpO2 97 %. Body mass index is 30.11 kg/m.     12/08/2021    1:28 PM 12/01/2021    9:19 AM 09/16/2021    9:18 AM  Vitals with BMI  Height _1  _2    Weight 175 lbs 6 oz 174 lbs 13 oz   BMI 53.66 44.03   Systolic 474 259 563  Diastolic 74 72 80  Pulse 83 89      Physical Exam Vitals reviewed.  HENT:     Head: Normocephalic and atraumatic.  Cardiovascular:     Rate and Rhythm: Normal rate and regular rhythm.     Pulses: Normal pulses.     Heart sounds: Normal heart sounds. No murmur heard. Pulmonary:     Effort: Pulmonary effort is normal.     Breath sounds: Normal breath sounds.  Abdominal:     General: Bowel sounds are normal.  Musculoskeletal:     Right lower leg: No edema.     Left lower leg: No edema.  Skin:  General: Skin is warm and dry.  Neurological:     Mental Status: She is alert.     Medications and allergies  No Known Allergies   Medication list after today's encounter   Current Outpatient Medications:    amLODipine (NORVASC) 5 MG tablet, Take 1 tablet (5 mg total) by mouth at bedtime., Disp: 90 tablet, Rfl: 1   aspirin EC 81 MG tablet, Take 1 tablet (81 mg total) by mouth daily. Swallow whole., Disp: 90 tablet, Rfl: 1   blood glucose meter kit and supplies KIT, Dispense based on patient and insurance preference. Use up to four times daily as directed. Dx code e11.65, Disp: 1 each, Rfl: 4   carvedilol (COREG) 6.25 MG tablet, Take 1 tablet (6.25 mg total) by mouth 2 (two) times daily with a meal., Disp: 180 tablet, Rfl: 3   DULoxetine (CYMBALTA) 30 MG capsule, Take 1 capsule (30 mg total) by mouth daily.,  Disp: 30 capsule, Rfl: 2   nitroGLYCERIN (NITROSTAT) 0.4 MG SL tablet, Place 1 tablet (0.4 mg total) under the tongue every 5 (five) minutes x 3 doses as needed for chest pain., Disp: 25 tablet, Rfl: 3   rosuvastatin (CRESTOR) 20 MG tablet, Take 1 tablet (20 mg total) by mouth daily., Disp: 90 tablet, Rfl: 3   Semaglutide, 1 MG/DOSE, (OZEMPIC, 1 MG/DOSE,) 4 MG/3ML SOPN, DIAL AND INJECT UNDER THE SKIN 1 MG WEEKLY (Patient taking differently: Inject 1 mg into the skin once a week.), Disp: 6 mL, Rfl: 2   [DISCONTINUED] valsartan (DIOVAN) 160 MG tablet, Take 1 tablet (160 mg total) by mouth daily., Disp: 90 tablet, Rfl: 2   isosorbide mononitrate (IMDUR) 30 MG 24 hr tablet, Take 1 tablet (30 mg total) by mouth daily., Disp: 90 tablet, Rfl: 3  Laboratory examination:   Lab Results  Component Value Date   NA 139 12/01/2021   K 4.8 12/01/2021   CO2 23 12/01/2021   GLUCOSE 100 (H) 12/01/2021   BUN 19 12/01/2021   CREATININE 0.92 12/01/2021   CALCIUM 9.8 12/01/2021   EGFR 68 12/01/2021   GFRNONAA >60 04/21/2021       Latest Ref Rng & Units 12/01/2021   10:24 AM 09/16/2021    9:39 AM 04/21/2021    2:22 AM  CMP  Glucose 70 - 99 mg/dL 100  102  98   BUN 8 - 27 mg/dL _0 Creatinine 0.57 - 1.00 mg/dL 0.92  0.72  0.93   Sodium 134 - 144 mmol/L 139  141  133   Potassium 3.5 - 5.2 mmol/L 4.8  4.6  3.8   Chloride 96 - 106 mmol/L 102  105  104   CO2 20 - 29 mmol/L _1 Calcium 8.7 - 10.3 mg/dL 9.8  9.5  8.5   Total Protein 6.0 - 8.5 g/dL 7.3  7.0  6.1   Total Bilirubin 0.0 - 1.2 mg/dL 0.5  0.4  0.5   Alkaline Phos 44 - 121 IU/L 75  71  47   AST 0 - 40 IU/L _2 ALT 0 - 32 IU/L _3 Latest Ref Rng & Units 12/01/2021   10:24 AM 09/16/2021    9:39 AM 04/21/2021    2:22 AM  CBC  WBC 3.4 - 10.8 x10E3/uL 6.4  6.4  5.9   Hemoglobin 11.1 - 15.9 g/dL 11.7  11.2  10.8   Hematocrit 34.0 - 46.6 % 38.4  36.3  33.5   Platelets 150 - 450 x10E3/uL 310  259  225      Lipid Panel Recent Labs    04/21/21 0222 09/16/21 0939  CHOL 187 145  TRIG 225* 150*  LDLCALC 95 61  VLDL 45*  --   HDL 47 59  CHOLHDL 4.0 2.5    HEMOGLOBIN A1C Lab Results  Component Value Date   HGBA1C 5.8 (H) 12/01/2021   TSH Recent Labs    04/24/21 0914  TSH 1.850    External labs:     Radiology:    Cardiac Studies:   04/2021 ECHO IMPRESSIONS   1. Left ventricular ejection fraction, by estimation, is 60 to 65%. The  left ventricle has normal function. The left ventricle has no regional  wall motion abnormalities. There is moderate asymmetric left ventricular  hypertrophy of the septal segment.  Left ventricular diastolic parameters are consistent with Grade I  diastolic dysfunction (impaired relaxation).   2. Right ventricular systolic function is normal. The right ventricular  size is normal.   3. The mitral valve is normal in structure. Trivial mitral valve  regurgitation. No evidence of mitral stenosis.   4. The aortic valve is tricuspid. Aortic valve regurgitation is not  visualized. Aortic valve sclerosis is present, with no evidence of aortic  valve stenosis.   5. The inferior vena cava is normal in size with greater than 50%  respiratory variability, suggesting right atrial pressure of 3 mmHg.    04/2021 CCTA FINDINGS: CT-FFR analysis was performed on the original cardiac CT angiogram dataset. Diagrammatic representation of the CT-FFR analysis is provided in a separate PDF document in PACS. This dictation was created using the PDF document and an interactive 3D model of the results. 3D model is not available in the EMR/PACS. Normal FFR range is >0.80.   1. Left Main: No significant functional stenosis, CT-FFR 0.99.   2. LAD: No significant functional stenosis, CT-FFR 0.83 distal vessel. 3. LCX: No significant functional stenosis, CT-FFR 0.97 and luminal narrowing. 4. RCA: No significant functional stenosis, CT-FFR 0.85 distal vessel.    IMPRESSION: 1. CT FFR analysis shows no evidence of significant functional stenosis.   04/2021 CCTA FINDINGS: Scan was triggered in the descending thoracic aorta. Axial non-contrast 3 mm slices were carried out through the heart. The data set was analyzed on a dedicated work station and scored using the [place]. Gantry rotation speed was 250 msecs and collimation was .6 mm. 0.8 mg of sl NTG was given. The 3D data set was reconstructed in 5% intervals of the 67-82 % of the R-R cycle. Diastolic phases were analyzed on a dedicated work station using MPR, MIP and VRT modes. The patient received 100 cc of contrast.   Aorta:  Normal size.  No calcifications.  No dissection.   Main Pulmonary Artery: Normal size of the pulmonary artery.   Aortic Valve:  Tri-leaflet.  No calcifications.   Coronary Arteries:  Normal coronary origin.  Right dominance.   Coronary Calcium Score:   Left main: 0   Left anterior descending artery: 33   Left circumflex artery: 0   Right coronary artery: 41   Total: 74   Percentile: 80th for age, sex, and race matched control.   RCA is a large right dominant artery that gives rise to PDA and PLA. Mild calcified plaques in the proximal vessel. Mild mixed plaque distal vessel.   Left  main is a large artery that gives rise to LAD and LCX arteries. There is no significant plaque.   LAD is a large vessel that gives rise to one two diagonal branches. Mild soft plaque mid LAD. Mild mixed plaque body of D1   LCX is a non-dominant artery that gives rise to one large OM1 branch. There is a moderate luminal narrowing in the body the mid circumflex   Other findings:   Normal pulmonary vein drainage into the left atrium.   Normal left atrial appendage without a thrombus.   Small PFO.   Extra-cardiac findings: See attached radiology report for non-cardiac structures.   Stair-step artifact notably in the RCA and LAD. This is from patient motion  artifact.   IMPRESSION: 1. Coronary calcium score of 74. This was 80th percentile for age, sex, and race matched control.   2. Normal coronary origin with right dominance.   3. CAD-RADS 3. Moderate stenosis. Consider symptom-guided anti-ischemic pharmacotherapy as well as risk factor modification per guideline directed care. Additional analysis with CT FFR will be submitted.  EKG:   12/08/2021: normal sinus rhythm, normal R wave progression, normal axis  Assessment     ICD-10-CM   1. DOE (dyspnea on exertion)  R06.09 EKG 12-Lead    2. Essential hypertension  I10     3. Mixed hyperlipidemia  E78.2     4. Palpitations  R00.2        Orders Placed This Encounter  Procedures   EKG 12-Lead    No orders of the defined types were placed in this encounter.   Medications Discontinued During This Encounter  Medication Reason   valsartan (DIOVAN) 165 MG tablet Duplicate     Recommendations:   Whitney Sanchez is a 66 y.o.  female with palpitations   Essential hypertension Continue current cardiac medications. Encourage low-sodium diet, less than 2000 mg daily.   Mixed hyperlipidemia Continue statin   Palpitations Patient instructed to take BP meds with water as directed rather than with coffee No further work-up necessary Follow-up PRN with cardiology     [name], DO, [facility]  12/09/2021, 1:22 PM Office: [phone] Pager: [phone]

## 2021-12-09 ENCOUNTER — Ambulatory Visit: Payer: Medicare PPO | Admitting: Orthopaedic Surgery

## 2021-12-09 DIAGNOSIS — R002 Palpitations: Secondary | ICD-10-CM | POA: Insufficient documentation

## 2021-12-19 LAB — HM DIABETES EYE EXAM

## 2021-12-23 ENCOUNTER — Telehealth: Payer: Self-pay

## 2021-12-23 NOTE — Chronic Care Management (AMB) (Signed)
Faxed 2024 re-enrollment application to [employer] patient assistance for Ozempic.    Tamala Melvin, CMA Clinical Pharmacist Assistant [phone]' 

## 2021-12-24 ENCOUNTER — Telehealth: Payer: Self-pay

## 2021-12-24 ENCOUNTER — Other Ambulatory Visit: Payer: Self-pay

## 2021-12-24 MED ORDER — AMLODIPINE BESYLATE 5 MG PO TABS: 5.0000 mg | ORAL_TABLET | Freq: Every evening | ORAL | 1 refills | Status: DC

## 2021-12-24 NOTE — Telephone Encounter (Signed)
Spoke with cardiology CMA who is reviewing patients orders, per CMA at [facility] she is uncertain of why the order was deleted possibly by accident. Lequita Halt the CMA is going to follow up with the cardiologist to determine if patient should be on Valsartan 160 mg tablet daily. Of note, on the note written by the cardiologist on  medication was discontinued by CMA Tara due to it being a duplicate medication on the list. CMA Lequita Halt reports that I will be notified today.   [name], CPP, PharmD Clinical Pharmacist Practitioner [facility] [phone]

## 2021-12-24 NOTE — Progress Notes (Addendum)
Chronic Care Management Pharmacy Assistant   Name: Whitney Sanchez  MRN: [id number] DOB: July 05, 1955   Reason for Encounter: Medication Review/Medication coordination  Recent office visits:  12-01-2021 Whitney Chard, MD. MCHC= 30.5. Glucose= 100. A1C= 5.8. Abnormal UA. Mammogram and bone density ordered. EKG completed. Referral placed to orthopedic therapy.  Recent consult visits:  12-08-2021 Whitney Flock, DO (Cardiology). Following up visit.  Hospital visits:  None in previous 6 months  Medications: Outpatient Encounter Medications as of 12/24/2021  Medication Sig   amLODipine (NORVASC) 5 MG tablet Take 1 tablet (5 mg total) by mouth at bedtime.   aspirin EC 81 MG tablet Take 1 tablet (81 mg total) by mouth daily. Swallow whole.   blood glucose meter kit and supplies KIT Dispense based on patient and insurance preference. Use up to four times daily as directed. Dx code e11.65   carvedilol (COREG) 6.25 MG tablet Take 1 tablet (6.25 mg total) by mouth 2 (two) times daily with a meal.   DULoxetine (CYMBALTA) 30 MG capsule Take 1 capsule (30 mg total) by mouth daily.   isosorbide mononitrate (IMDUR) 30 MG 24 hr tablet Take 1 tablet (30 mg total) by mouth daily.   nitroGLYCERIN (NITROSTAT) 0.4 MG SL tablet Place 1 tablet (0.4 mg total) under the tongue every 5 (five) minutes x 3 doses as needed for chest pain.   rosuvastatin (CRESTOR) 20 MG tablet Take 1 tablet (20 mg total) by mouth daily.   Semaglutide, 1 MG/DOSE, (OZEMPIC, 1 MG/DOSE,) 4 MG/3ML SOPN DIAL AND INJECT UNDER THE SKIN 1 MG WEEKLY (Patient taking differently: Inject 1 mg into the skin once a week.)   [DISCONTINUED] valsartan (DIOVAN) 160 MG tablet Take 1 tablet (160 mg total) by mouth daily.   No facility-administered encounter medications on file as of 12/24/2021.  Reviewed chart for medication changes ahead of medication coordination call.  No hospital visits since last care coordination call/Pharmacist visit.    No medication changes indicated   BP Readings from Last 3 Encounters:  12/08/21 120/74  12/01/21 136/72  09/16/21 122/80    Lab Results  Component Value Date   HGBA1C 5.8 (H) 12/01/2021     Patient obtains medications through Adherence Packaging  90 Days   Last adherence delivery included:  Aspirin 81 mg at breakfast Carvedilol 6.25 mg at breakfast and with evening meal Duloxetine 20 mg at breakfast Imdur 30 mg at breakfast Rosuvastatin 20 mg at breakfast Valsartan 160 mg at breakfast Amlodipine 5 mg at bedtime  Patient declined (meds) last month delivery: None   Patient is due for next adherence delivery on: 01-05-2022  Called patient and reviewed medications and coordinated delivery.  This delivery to include: Aspirin 81 mg at breakfast Carvedilol 6.25 mg at breakfast and with evening meal Duloxetine 20 mg at breakfast Imdur 30 mg at breakfast Rosuvastatin 20 mg at breakfast Valsartan 160 mg at breakfast Amlodipine 5 mg at bedtime  No acute/ short fills needed  Patient declined the following medications: Nitrostat- plenty supply since PRN  Patient needs refills for: Sent refills to PCP Amlodipine  Confirmed delivery date of 01-05-2022, advised patient that pharmacy will contact them the morning of delivery.  12-26-2021: patient reminded of appointment with Whitney Sanchez on 12-12 -2023 at 9:00. No concerns she would like to discuss.  Care Gaps: Tdap overdue Yearly ophthalmology overdue  Covid booster overdue  Shingrix overdue  Star Rating Drugs: Ozempic 1 mg- Patient assistance Valsartan 160 mg- Last filled 10-02-2021 90 [facility]. Previous 07-02-2021 90 [facility] Rosuvastatin 20 mg- Last filled 10-01-2021 90 [facility]. Previous 08-06-2021 62 [facility]  [place] Pharmacist Assistant [phone]

## 2021-12-24 NOTE — Telephone Encounter (Signed)
Pt's PCP is calling to ensure it is okay for the patient to still take Valsartan. It was deleted out of the patients chart and the PCP doesn't see why and thinks it may have been an accident. Please advice, and I will call them back.  Call back number: [phone] [place])

## 2021-12-25 ENCOUNTER — Telehealth: Payer: Self-pay

## 2021-12-25 NOTE — Progress Notes (Signed)
12-07-2023Lequita Halt called back from cardiology and stated patient is to continue with Valsartan.  [name] [facility] Clinical Pharmacist Assistant [phone]

## 2021-12-25 NOTE — Telephone Encounter (Signed)
Pt PCP called wanting to know if it was still safe for the patient to take Valsartan. After consulting with Dr. Melton Alar, she says it is fine for the patient to continue taking the Valsartan. Called the PCP and let them know.

## 2021-12-30 ENCOUNTER — Telehealth: Payer: Medicare PPO

## 2021-12-30 ENCOUNTER — Ambulatory Visit (INDEPENDENT_AMBULATORY_CARE_PROVIDER_SITE_OTHER): Payer: Medicare PPO

## 2021-12-30 DIAGNOSIS — E1165 Type 2 diabetes mellitus with hyperglycemia: Secondary | ICD-10-CM

## 2021-12-30 DIAGNOSIS — I119 Hypertensive heart disease without heart failure: Secondary | ICD-10-CM

## 2021-12-30 MED ORDER — VALSARTAN 160 MG PO TABS: 160.0000 mg | ORAL_TABLET | Freq: Every day | ORAL | 2 refills | Status: AC

## 2021-12-30 NOTE — Patient Instructions (Addendum)
Visit Information It was great speaking with you today!  Please let me know if you have any questions about our visit.   Goals Addressed             This Visit's Progress    Manage My Medicine       Timeframe:  Long-Range Goal Priority:  High Start Date:                             Expected End Date:                       Follow Up Date 12/29/2022   In Progress:  - call for medicine refill 2 or 3 days before it runs out - call if I am sick and can't take my medicine - keep a list of all the medicines I take; vitamins and herbals too - learn to read medicine labels - use a pillbox to sort medicine    Why is this important?   These steps will help you keep on track with your medicines.   Notes:  -Please make sure to check your mail for follow up from patient assistance for Ozempic.         Patient Care Plan: CCM Pharmacy Care Plan     Problem Identified: HTN, DM II   Priority: High     Goal: Disease Management   Recent Progress: On track  Priority: High  Note:   Current Barriers:  Unable to independently afford treatment regimen Unable to independently monitor therapeutic efficacy  Pharmacist Clinical Goal(s):  Patient will achieve adherence to monitoring guidelines and medication adherence to achieve therapeutic efficacy through collaboration with Whitney Sanchez and provider.   Interventions: 1:1 collaboration with [name], MD regarding development and update of comprehensive plan of care as evidenced by provider attestation and co-signature Inter-disciplinary care team collaboration (see longitudinal plan of care) Comprehensive medication review performed; medication list updated in electronic medical record  Hypertension (BP goal <130/80) -Controlled -Current treatment: Amlodipine 5 mg tablet once a day Appropriate, Effective, Safe, Accessible Isosorbide mononitrate 30 mg tablet once per day Appropriate, Effective, Safe, Accessible Carvedilol 6.25 mg  tablet twice per day Appropriate, Effective, Safe, Accessible Valsartan 160 mg tablet once per day Appropriate, Effective, Safe, Accessible -Current home readings: she is checking her BP but she does not always write down the readings 133/81 -Denies hypotensive/hypertensive symptoms -Educated on BP goals and benefits of medications for prevention of heart attack, stroke and kidney damage; Daily salt intake goal < 2300 mg; Importance of home blood pressure monitoring; -Counseled to monitor BP at home at least two times per week, document, and provide log at future appointments -Recommended to continue current medication  Diabetes (A1c goal <7%) -Controlled -Current medications: Ozempic 1 mg once weekly on Tuesday Appropriate, Effective, Safe, Accessible  Xigduo XR 10-998 mg tablet once per day Appropriate, Effective, Query Safe -Current home glucose readings: she is checking it daily  fasting glucose: 113, 90,96 -Denies hypoglycemic/hyperglycemic symptoms -Current meal patterns: she is getting full quickly and does not usually finish her meals  breakfast: piece of bacon with an egg and sometimes a bowl of cereal with a banana   dinner: meat and vegetable maybe a sweet potato  drinks: water with lemon, cup of coffee -Current exercise: she reports not exercising like she is supposed too, sometimes she walks on the treadmill but not always  -[name] reports that  in the past she has had tingling in the feet,  -Educated on A1c and blood sugar goals; Prevention and management of hypoglycemic episodes; -Counseled to check feet daily and get yearly eye exams -Recommended to continue current medication  Patient Goals/Self-Care Activities Patient will:  - take medications as prescribed as evidenced by patient report and record review collaborate with provider on medication access solutions  Follow Up Plan: The patient has been provided with contact information for the care management  team and has been advised to call with any health related questions or concerns.      Patient agreed to services and verbal consent obtained.   The patient verbalized understanding of instructions, educational materials, and care plan provided today and agreed to receive a mailed copy of patient instructions, educational materials, and care plan.   Whitney Sanchez, Whitney Sanchez Clinical Pharmacist [facility] [phone]

## 2021-12-30 NOTE — Progress Notes (Signed)
Chronic Care Management Pharmacy Note  12/30/2021 Name:  Whitney Sanchez MRN:  [id number] DOB:  07-04-1955  Summary: Patient reports that she is doing okay and taking her medication at the same time each day.  Recommendations/Changes made from today's visit: Recommend Ms. Kunsman get vaccinations completed  Recommend increasing the amount of exercise she is doing   Plan: Plant to exercise 2-3 days per week for 15 minutes and then increase her exercise to 15 minutes per day per week.  Ms. Carlton is going to review her next immunization choice once she gets her second shingrix vaccine.    Subjective: Whitney Sanchez is an 66 y.o. year old female who is a primary patient of Glendale Chard, MD.  The CCM team was consulted for assistance with disease management and care coordination needs.    Engaged with patient by telephone for follow up visit in response to provider referral for pharmacy case management and/or care coordination services.   Consent to Services:  The patient was given information about Chronic Care Management services, agreed to services, and gave verbal consent prior to initiation of services.  Please see initial visit note for detailed documentation.   Patient Care Team: Glendale Chard, MD as PCP - General (Internal Medicine) Werner Lean, MD as PCP - Cardiology (Cardiology) Mayford Knife, [facility] (Pharmacist)  Recent office visits: 12/01/2021 Annual physical exam 09/16/2021 PCP OV 04/24/2021 PCP OV  Recent consult visits: 12/08/2021 Cardiology [facility] visits: None in previous 6 months   Objective:  Lab Results  Component Value Date   CREATININE 0.92 12/01/2021   BUN 19 12/01/2021   EGFR 68 12/01/2021   GFRNONAA >60 04/21/2021   GFRAA 80 02/23/2020   NA 139 12/01/2021   K 4.8 12/01/2021   CALCIUM 9.8 12/01/2021   CO2 23 12/01/2021   GLUCOSE 100 (H) 12/01/2021    Lab Results  Component Value Date/Time   HGBA1C 5.8  (H) 12/01/2021 10:24 AM   HGBA1C 5.8 (H) 09/16/2021 09:39 AM   HGBA1C 6.4 08/16/2017 12:00 AM   MICROALBUR 10 10/10/2018 04:56 PM    Last diabetic Eye exam:  Lab Results  Component Value Date/Time   HMDIABEYEEXA No Retinopathy 05/29/2020 12:00 AM    Last diabetic Foot exam: No results found for: "HMDIABFOOTEX"   Lab Results  Component Value Date   CHOL 145 09/16/2021   HDL 59 09/16/2021   LDLCALC 61 09/16/2021   TRIG 150 (H) 09/16/2021   CHOLHDL 2.5 09/16/2021       Latest Ref Rng & Units 12/01/2021   10:24 AM 09/16/2021    9:39 AM 04/21/2021    2:22 AM  Hepatic Function  Total Protein 6.0 - 8.5 g/dL 7.3  7.0  6.1   Albumin 3.9 - 4.9 g/dL 4.7  4.5  3.4   AST 0 - 40 IU/L _0 ALT 0 - 32 IU/L _1 Alk Phosphatase 44 - 121 IU/L 75  71  47   Total Bilirubin 0.0 - 1.2 mg/dL 0.5  0.4  0.5   Bilirubin, Direct 0.0 - 0.2 mg/dL   0.1     Lab Results  Component Value Date/Time   TSH 1.850 04/24/2021 09:14 AM       Latest Ref Rng & Units 12/01/2021   10:24 AM 09/16/2021    9:39 AM 04/21/2021    2:22 AM  CBC  WBC 3.4 - 10.8 x10E3/uL 6.4  6.4  5.9   Hemoglobin 11.1 - 15.9 g/dL 11.7  11.2  10.8   Hematocrit 34.0 - 46.6 % 38.4  36.3  33.5   Platelets 150 - 450 x10E3/uL 310  259  225     Lab Results  Component Value Date/Time   VD25OH 36.9 11/10/2016 12:00 AM   VITAMINB12 858 09/16/2021 09:39 AM   VITAMINB12 1,687 (H) 04/24/2021 09:14 AM    Clinical ASCVD: No  The 10-year ASCVD risk score (Arnett DK, et al., 2019) is: 16.1%   Values used to calculate the score:     Age: 66 years     Sex: Female     Is Non-Hispanic African American: Yes     Diabetic: Yes     Tobacco smoker: No     Systolic Blood Pressure: 767 mmHg     Is BP treated: Yes     HDL Cholesterol: 59 mg/dL     Total Cholesterol: 145 mg/dL       12/01/2021    9:17 AM 03/06/2021    3:32 PM 10/31/2020   11:22 AM  Depression screen PHQ 2/9  Decreased Interest 1 0 0  Down, Depressed, Hopeless  1 0 0  PHQ - 2 Score 2 0 0  Altered sleeping 0  0  Tired, decreased energy 0  0  Change in appetite 0  0  Feeling bad or failure about yourself  0  0  Trouble concentrating 1  0  Moving slowly or fidgety/restless 0  0  Suicidal thoughts 0  0  PHQ-9 Score 3  0  Difficult doing work/chores Somewhat difficult        Social History   Tobacco Use  Smoking Status Never   Passive exposure: Never  Smokeless Tobacco Never   BP Readings from Last 3 Encounters:  12/08/21 120/74  12/01/21 136/72  09/16/21 122/80   Pulse Readings from Last 3 Encounters:  12/08/21 83  12/01/21 89  09/16/21 74   Wt Readings from Last 3 Encounters:  12/08/21 175 lb 6.4 oz (79.6 kg)  12/01/21 174 lb 12.8 oz (79.3 kg)  09/16/21 180 lb 3.2 oz (81.7 kg)   BMI Readings from Last 3 Encounters:  12/08/21 30.11 kg/m  12/01/21 30.00 kg/m  09/16/21 30.93 kg/m    Assessment/Interventions: Review of patient past medical history, allergies, medications, health status, including review of consultants reports, laboratory and other test data, was performed as part of comprehensive evaluation and provision of chronic care management services.   SDOH:  (Social Determinants of Health) assessments and interventions performed: Yes SDOH Interventions    Flowsheet Row Chronic Care Management from 12/30/2021 in [place] [facility] from 07/01/2021 in [facility] Chronic Care Management from 12/26/2020 in [facility] Chronic Care Management from 09/25/2020 in [facility] Chronic Care Management from 06/12/2020 in [facility] Clinical Support from 02/23/2020 in [facility]  SDOH Interventions        Food Insecurity Interventions -- -- -- -- -- Intervention Not Indicated  Housing Interventions -- -- -- -- -- Intervention Not Indicated  Financial Strain Interventions --  [patient  assistance for Ozempic] --  [Patient assistance application for Ozempic] --  [Patient assistance completed for Ozempic] --  [Patient assistance application] Other (Comment)  [apply for patient assistance through drug companies.] Intervention Not Indicated  Physical Activity Interventions -- -- -- -- -- Intervention Not Indicated  Social Connections Interventions -- -- -- -- --  Intervention Not Indicated      SDOH Screenings   Food Insecurity: No Food Insecurity (03/06/2021)  Housing: Low Risk  (02/23/2020)  Transportation Needs: No Transportation Needs (03/06/2021)  Depression (PHQ2-9): Low Risk  (12/01/2021)  Financial Resource Strain: High Risk (12/30/2021)  Physical Activity: Insufficiently Active (03/06/2021)  Social Connections: Moderately Integrated (02/23/2020)  Stress: No Stress Concern Present (03/06/2021)  Tobacco Use: Low Risk  (12/08/2021)    CCM Care Plan  No Known Allergies  Medications Reviewed Today     Reviewed by Milderd Meager, CMA (Certified Medical Assistant) on 12/08/21 at 1328  Med List Status: <None>   Medication Order Taking? Sig Documenting Provider Last Dose Status Informant  amLODipine (NORVASC) 5 MG tablet [id number] Yes Take 1 tablet (5 mg total) by mouth at bedtime. Glendale Chard, MD Taking Active   aspirin EC 81 MG tablet [id number] Yes Take 1 tablet (81 mg total) by mouth daily. Swallow whole. Glendale Chard, MD Taking Active   blood glucose meter kit and supplies KIT [id number] Yes Dispense based on patient and insurance preference. Use up to four times daily as directed. Dx code e11.65 Glendale Chard, MD Taking Active   carvedilol (COREG) 6.25 MG tablet [id number] Yes Take 1 tablet (6.25 mg total) by mouth 2 (two) times daily with a meal. Glendale Chard, MD Taking Active   DULoxetine (CYMBALTA) 30 MG capsule [id number] Yes Take 1 capsule (30 mg total) by mouth daily. Glendale Chard, MD Taking Active   isosorbide mononitrate (IMDUR) 30 MG 24 hr tablet  [id number]  Take 1 tablet (30 mg total) by mouth daily. Glendale Chard, MD  Active   nitroGLYCERIN (NITROSTAT) 0.4 MG SL tablet [id number] Yes Place 1 tablet (0.4 mg total) under the tongue every 5 (five) minutes x 3 doses as needed for chest pain. Glendale Chard, MD Taking Active   rosuvastatin (CRESTOR) 20 MG tablet [id number] Yes Take 1 tablet (20 mg total) by mouth daily. Glendale Chard, MD Taking Active   Semaglutide, 1 MG/DOSE, (OZEMPIC, 1 MG/DOSE,) 4 MG/3ML SOPN [id number] Yes DIAL AND INJECT UNDER THE SKIN 1 MG WEEKLY  Patient taking differently: Inject 1 mg into the skin once a week.   Glendale Chard, MD Taking Active Self  Discontinued 07/18/21 1258 (Reorder)             Patient Active Problem List   Diagnosis Date Noted   Palpitations 12/09/2021   DOE (dyspnea on exertion) 12/08/2021   Hypertensive heart disease without heart failure 12/03/2021   Chronic right shoulder pain 12/03/2021   Mixed hyperlipidemia 04/21/2021   Hypertensive urgency 04/21/2021   DM (diabetes mellitus), type 2 ([place]) 04/21/2021   Chest pain 04/20/2021   Myalgia 11/02/2020   Arthralgia 11/02/2020   Numbness and tingling of hand 09/18/2020   Uncontrolled type 2 diabetes mellitus with hyperglycemia ([place]) 03/17/2018   Essential hypertension 03/17/2018   Pure hypercholesterolemia 03/17/2018   Class 1 obesity due to excess calories with serious comorbidity and body mass index (BMI) of 32.0 to 32.9 in adult 03/17/2018    Immunization History  Administered Date(s) Administered   Fluad Quad(high Dose 65+) 10/18/2019, 12/31/2020, 12/01/2021   Influenza-Unspecified 10/19/2017   Moderna Covid-19 Vaccine Bivalent Booster 59yr & up 01/14/2021   Moderna SARS-COV2 Booster Vaccination 11/17/2019   Moderna Sars-Covid-2 Vaccination 01/21/2019, 02/18/2019, 01/14/2021   PNEUMOCOCCAL CONJUGATE-20 02/18/2021   Pneumococcal Polysaccharide-23 02/05/2015   Zoster Recombinat (Shingrix) 09/16/2021    Conditions to be  addressed/monitored:  Hypertension and Diabetes  Care Plan :  CCM Pharmacy Care Plan  Updates made by Mayford Knife, RPH since 12/30/2021 12:00 AM     Problem: HTN, DM II   Priority: High     Goal: Disease Management   Recent Progress: On track  Priority: High  Note:   Current Barriers:  Unable to independently afford treatment regimen Unable to independently monitor therapeutic efficacy  Pharmacist Clinical Goal(s):  Patient will achieve adherence to monitoring guidelines and medication adherence to achieve therapeutic efficacy through collaboration with PharmD and provider.   Interventions: 1:1 collaboration with Glendale Chard, MD regarding development and update of comprehensive plan of care as evidenced by provider attestation and co-signature Inter-disciplinary care team collaboration (see longitudinal plan of care) Comprehensive medication review performed; medication list updated in electronic medical record  Hypertension (BP goal <130/80) -Controlled -Current treatment: Amlodipine 5 mg tablet once a day Appropriate, Effective, Safe, Accessible Isosorbide mononitrate 30 mg tablet once per day Appropriate, Effective, Safe, Accessible Carvedilol 6.25 mg tablet twice per day Appropriate, Effective, Safe, Accessible Valsartan 160 mg tablet once per day Appropriate, Effective, Safe, Accessible -Current home readings: she is checking her BP but she does not always write down the readings 133/81 -Denies hypotensive/hypertensive symptoms -Educated on BP goals and benefits of medications for prevention of heart attack, stroke and kidney damage; Daily salt intake goal < 2300 mg; Importance of home blood pressure monitoring; -Counseled to monitor BP at home at least two times per week, document, and provide log at future appointments -Recommended to continue current medication  Diabetes (A1c goal <7%) -Controlled -Current medications: Ozempic 1 mg once weekly on Tuesday  Appropriate, Effective, Safe, Accessible  -Current home glucose readings: she is checking it daily  fasting glucose: 113, 90,96 -Denies hypoglycemic/hyperglycemic symptoms -Current meal patterns: she is getting full quickly and does not usually finish her meals  breakfast: piece of bacon with an egg and sometimes a bowl of cereal with a banana   dinner: meat and vegetable maybe a sweet potato  drinks: water with lemon, cup of coffee -Current exercise: she reports not exercising like she is supposed too, sometimes she walks on the treadmill but not always  -Ms. Hudler reports that in the past she has had tingling in the feet,  -Educated on A1c and blood sugar goals; Prevention and management of hypoglycemic episodes; -Counseled to check feet daily and get yearly eye exams -Recommended to continue current medication  Patient Goals/Self-Care Activities Patient will:  - take medications as prescribed as evidenced by patient report and record review collaborate with provider on medication access solutions  Follow Up Plan: The patient has been provided with contact information for the care management team and has been advised to call with any health related questions or concerns.       Medication Assistance: Application for Ozempic  medication assistance program. in process.  Anticipated assistance start date 01/2022 - pending next year.  See plan of care for additional detail.  Compliance/Adherence/Medication fill history: Care Gaps: Ophthalmology Exam - patient reports that she had an eye exam a couple of weeks ago at [place]- will complete next she has OV in [id number] Tdap - Ms. Ducey is going complete this vaccination after getting her second Shingrix vaccine COVID Booster  Star-Rating Drugs: Rosuvastatin 20 mg tablet  Semaglutide 1 mg/dose Valsartan 160 mg tablet   Patient's preferred pharmacy is:  [facility] - [place], [place] - [address] [place] [address]  Phone: [phone] Fax: [phone]  Uses pill box? No - patient is currently in adherence packaging  Pt endorses 95% compliance  We discussed: Benefits of medication synchronization, packaging and delivery as well as enhanced pharmacist oversight with [facility]. Patient decided to: Continue current medication management strategy  Care Plan and Follow Up Patient Decision:  Patient agrees to Care Plan and Follow-up.  Plan: The patient has been provided with contact information for the care management team and has been advised to call with any health related questions or concerns.   [name], CPP, PharmD Clinical Pharmacist Practitioner [facility] [phone]

## 2021-12-31 NOTE — Telephone Encounter (Signed)
error 

## 2022-01-18 DIAGNOSIS — I10 Essential (primary) hypertension: Secondary | ICD-10-CM | POA: Diagnosis not present

## 2022-01-18 DIAGNOSIS — Z7985 Long-term (current) use of injectable non-insulin antidiabetic drugs: Secondary | ICD-10-CM

## 2022-01-18 DIAGNOSIS — E1159 Type 2 diabetes mellitus with other circulatory complications: Secondary | ICD-10-CM | POA: Diagnosis not present

## 2022-01-22 ENCOUNTER — Other Ambulatory Visit: Payer: Self-pay

## 2022-01-22 ENCOUNTER — Telehealth: Payer: Self-pay

## 2022-01-22 DIAGNOSIS — F419 Anxiety disorder, unspecified: Secondary | ICD-10-CM

## 2022-01-22 MED ORDER — DULOXETINE HCL 30 MG PO CPEP: 30.0000 mg | ORAL_CAPSULE | Freq: Every day | ORAL | 2 refills | Status: DC

## 2022-01-22 NOTE — Progress Notes (Cosign Needed)
Care Management & Coordination Services Pharmacy Team  Reason for Encounter: Medication coordination and delivery  Contacted patient on 01/22/2022 to discuss medications   Recent office visits:  None  Recent consult visits:  None  Hospital visits:  None in previous 6 months  Medications: Outpatient Encounter Medications as of 01/22/2022  Medication Sig   amLODipine (NORVASC) 5 MG tablet Take 1 tablet (5 mg total) by mouth at bedtime.   aspirin EC 81 MG tablet Take 1 tablet (81 mg total) by mouth daily. Swallow whole.   blood glucose meter kit and supplies KIT Dispense based on patient and insurance preference. Use up to four times daily as directed. Dx code e11.65   carvedilol (COREG) 6.25 MG tablet Take 1 tablet (6.25 mg total) by mouth 2 (two) times daily with a meal.   DULoxetine (CYMBALTA) 30 MG capsule Take 1 capsule (30 mg total) by mouth daily.   isosorbide mononitrate (IMDUR) 30 MG 24 hr tablet Take 1 tablet (30 mg total) by mouth daily.   nitroGLYCERIN (NITROSTAT) 0.4 MG SL tablet Place 1 tablet (0.4 mg total) under the tongue every 5 (five) minutes x 3 doses as needed for chest pain.   rosuvastatin (CRESTOR) 20 MG tablet Take 1 tablet (20 mg total) by mouth daily.   Semaglutide, 1 MG/DOSE, (OZEMPIC, 1 MG/DOSE,) 4 MG/3ML SOPN DIAL AND INJECT UNDER THE SKIN 1 MG WEEKLY (Patient taking differently: Inject 1 mg into the skin once a week.)   valsartan (DIOVAN) 160 MG tablet Take 1 tablet (160 mg total) by mouth daily.   No facility-administered encounter medications on file as of 01/22/2022.   BP Readings from Last 3 Encounters:  12/08/21 120/74  12/01/21 136/72  09/16/21 122/80    Pulse Readings from Last 3 Encounters:  12/08/21 83  12/01/21 89  09/16/21 74    Lab Results  Component Value Date/Time   HGBA1C 5.8 (H) 12/01/2021 10:24 AM   HGBA1C 5.8 (H) 09/16/2021 09:39 AM   HGBA1C 6.4 08/16/2017 12:00 AM   Lab Results  Component Value Date   CREATININE 0.92 12/01/2021    BUN 19 12/01/2021   GFRNONAA >60 04/21/2021   GFRAA 80 02/23/2020   NA 139 12/01/2021   K 4.8 12/01/2021   CALCIUM 9.8 12/01/2021   CO2 23 12/01/2021     Last adherence delivery date: 01-05-2022  Patient is due for next adherence delivery on: 02-03-2022  Spoke with patient on 01-23-2022 reviewed medications and coordinated delivery.  This delivery to include: Adherence Packaging  30 Days  Aspirin 81 mg at breakfast Carvedilol 6.25 mg at breakfast and with evening meal Duloxetine 20 mg at breakfast Imdur 30 mg at breakfast Rosuvastatin 20 mg at breakfast Valsartan 160 mg at breakfast Amlodipine 5 mg at bedtime  Patient declined the following medications this month: Nitrostat- plenty supply since PRN  Ozempic- PAP  Refills requested from providers include: Duloxetine  Confirmed delivery date of 02-03-2022, advised patient that pharmacy will contact them the morning of delivery.   Any concerns about your medications? No  How often do you forget or accidentally miss a dose? Never  Do you use a pillbox? No  Is patient in packaging Yes  If yes  What is the date on your next pill pack? 01-24-2022  Any concerns or issues with your packaging? No   Recent blood pressure readings are as follows: 120/74 12-08-2021  Recent blood glucose readings are as follows: 5.8 12-01-2021    Cycle dispensing form sent to  Whitney Sanchez for review.  01-22-2022: 1st attempt left VM   Whitney Sanchez

## 2022-01-23 ENCOUNTER — Telehealth: Payer: Self-pay

## 2022-01-23 NOTE — Progress Notes (Addendum)
Novo Nordisk patient assistance program notification:  Patient has been approved for 2024 re-enrollment year for Ozempic, enrollment ends 01/19/2023.    Tamala Melvin, CMA Clinical Pharmacist Assistant [phone]   

## 2022-02-04 ENCOUNTER — Ambulatory Visit
Admission: RE | Admit: 2022-02-04 | Discharge: 2022-02-04 | Disposition: A | Discharge status: Home / Self Care | Payer: Medicare PPO | Source: Ambulatory Visit | Attending: Internal Medicine | Admitting: Internal Medicine

## 2022-02-04 DIAGNOSIS — Z1231 Encounter for screening mammogram for malignant neoplasm of breast: Secondary | ICD-10-CM | POA: Diagnosis not present

## 2022-02-16 ENCOUNTER — Telehealth: Payer: Self-pay

## 2022-02-16 NOTE — Progress Notes (Addendum)
As of 02/10/2022 patient picked up 4 boxes of Ozempic 1 mg- Novo Nordisk patient assistance.    Tamala Melvin, CMA Clinical Pharmacist Assistant [phone]  

## 2022-02-23 ENCOUNTER — Telehealth: Payer: Self-pay

## 2022-02-23 NOTE — Progress Notes (Signed)
Care Management & Coordination Services Pharmacy Team  Reason for Encounter: Medication coordination and delivery  Contacted patient to discuss medications and coordinate delivery from [facility]. Spoke with patient on 02/23/2022  Cycle dispensing form sent to Whitney Sanchez for review.   Last adherence delivery date: 02-03-2022  Patient is due for next adherence delivery on: 03-05-2022  This delivery to include: Adherence Packaging  30 Days  Aspirin 81 mg at breakfast Carvedilol 6.25 mg at breakfast and with evening meal Duloxetine 20 mg at breakfast Imdur 30 mg at breakfast Rosuvastatin 20 mg at breakfast Valsartan 160 mg at breakfast Amlodipine 5 mg at bedtime  Patient declined the following medications this month: Nitrostat- plenty supply since PRN  Ozempic- PAP  No refill request needed.  Confirmed delivery date of 03-05-2022, advised patient that pharmacy will contact them the morning of delivery.   Any concerns about your medications? No  How often do you forget or accidentally miss a dose? Never  Do you use a pillbox? No  Is patient in packaging Yes  If yes  What is the date on your next pill pack? 02-24-2022  Any concerns or issues with your packaging? No   Recent blood pressure readings are as follows: 122/78, 120/80  Recent blood glucose readings are as follows: 100, 97   Chart review: Recent office visits:  None  Recent consult visits:  None  Hospital visits:  None in previous 6 months  Medications: Outpatient Encounter Medications as of 02/23/2022  Medication Sig   amLODipine (NORVASC) 5 MG tablet Take 1 tablet (5 mg total) by mouth at bedtime.   aspirin EC 81 MG tablet Take 1 tablet (81 mg total) by mouth daily. Swallow whole.   blood glucose meter kit and supplies KIT Dispense based on patient and insurance preference. Use up to four times daily as directed. Dx code e11.65   carvedilol (COREG) 6.25 MG tablet Take 1 tablet (6.25 mg  total) by mouth 2 (two) times daily with a meal.   DULoxetine (CYMBALTA) 30 MG capsule Take 1 capsule (30 mg total) by mouth daily.   isosorbide mononitrate (IMDUR) 30 MG 24 hr tablet Take 1 tablet (30 mg total) by mouth daily.   nitroGLYCERIN (NITROSTAT) 0.4 MG SL tablet Place 1 tablet (0.4 mg total) under the tongue every 5 (five) minutes x 3 doses as needed for chest pain.   rosuvastatin (CRESTOR) 20 MG tablet Take 1 tablet (20 mg total) by mouth daily.   Semaglutide, 1 MG/DOSE, (OZEMPIC, 1 MG/DOSE,) 4 MG/3ML SOPN DIAL AND INJECT UNDER THE SKIN 1 MG WEEKLY (Patient taking differently: Inject 1 mg into the skin once a week.)   valsartan (DIOVAN) 160 MG tablet Take 1 tablet (160 mg total) by mouth daily.   No facility-administered encounter medications on file as of 02/23/2022.   BP Readings from Last 3 Encounters:  12/08/21 120/74  12/01/21 136/72  09/16/21 122/80    Pulse Readings from Last 3 Encounters:  12/08/21 83  12/01/21 89  09/16/21 74    Lab Results  Component Value Date/Time   HGBA1C 5.8 (H) 12/01/2021 10:24 AM   HGBA1C 5.8 (H) 09/16/2021 09:39 AM   HGBA1C 6.4 08/16/2017 12:00 AM   Lab Results  Component Value Date   CREATININE 0.92 12/01/2021   BUN 19 12/01/2021   GFRNONAA >60 04/21/2021   GFRAA 80 02/23/2020   NA 139 12/01/2021   K 4.8 12/01/2021   CALCIUM 9.8 12/01/2021   CO2 23 12/01/2021  [place] Pharmacist Assistant [phone]

## 2022-03-11 ENCOUNTER — Encounter: Payer: Self-pay | Admitting: Internal Medicine

## 2022-03-11 ENCOUNTER — Ambulatory Visit: Payer: Medicare PPO

## 2022-03-11 ENCOUNTER — Ambulatory Visit (INDEPENDENT_AMBULATORY_CARE_PROVIDER_SITE_OTHER): Payer: Medicare PPO | Admitting: Internal Medicine

## 2022-03-11 VITALS — BP 106/74 | HR 84 | Temp 98.3°F | Ht 64.0 in | Wt 172.4 lb

## 2022-03-11 DIAGNOSIS — E785 Hyperlipidemia, unspecified: Secondary | ICD-10-CM

## 2022-03-11 DIAGNOSIS — I2510 Atherosclerotic heart disease of native coronary artery without angina pectoris: Secondary | ICD-10-CM

## 2022-03-11 DIAGNOSIS — Z6829 Body mass index (BMI) 29.0-29.9, adult: Secondary | ICD-10-CM | POA: Diagnosis not present

## 2022-03-11 DIAGNOSIS — I119 Hypertensive heart disease without heart failure: Secondary | ICD-10-CM | POA: Diagnosis not present

## 2022-03-11 DIAGNOSIS — Z23 Encounter for immunization: Secondary | ICD-10-CM

## 2022-03-11 DIAGNOSIS — E1165 Type 2 diabetes mellitus with hyperglycemia: Secondary | ICD-10-CM

## 2022-03-11 DIAGNOSIS — E2839 Other primary ovarian failure: Secondary | ICD-10-CM

## 2022-03-11 DIAGNOSIS — E1169 Type 2 diabetes mellitus with other specified complication: Secondary | ICD-10-CM | POA: Diagnosis not present

## 2022-03-11 DIAGNOSIS — R002 Palpitations: Secondary | ICD-10-CM | POA: Diagnosis not present

## 2022-03-11 DIAGNOSIS — F419 Anxiety disorder, unspecified: Secondary | ICD-10-CM

## 2022-03-11 NOTE — Patient Instructions (Signed)

## 2022-03-11 NOTE — Progress Notes (Signed)
I,Whitney Sanchez,acting as a scribe for Whitney Greenland, MD.,have documented all relevant documentation on the behalf of Whitney Greenland, MD,as directed by  Whitney Greenland, MD while in the presence of Whitney Greenland, MD.    Subjective:     Patient ID: Whitney Sanchez , female    DOB: 03/25/1955 , 67 y.o.   MRN: [id number]   Chief Complaint  Patient presents with  . Diabetes  . Hypertension    HPI  She presents today for diabetes/HTN f/u.  She reports compliance with meds.  She denies headaches, chest pain and palpitations. However, she does admit to shortness of breath. She states she sometimes has issues walking to her car from the store. No associated diaphoresis.  She states visiting cardiologist about a month ago. No abnormalities found.   Diabetes She presents for her follow-up diabetic visit. She has type 2 diabetes mellitus. Her disease course has been stable. Hypoglycemia symptoms include dizziness. Pertinent negatives for diabetes include no blurred vision and no chest pain. There are no hypoglycemic complications. Risk factors for coronary artery disease include diabetes mellitus, dyslipidemia, hypertension, sedentary lifestyle, post-menopausal and obesity. She participates in exercise intermittently. An ACE inhibitor/angiotensin II receptor blocker is being taken.  Hypertension This is a chronic problem. The current episode started more than 1 year ago. The problem has been gradually improving since onset. The problem is uncontrolled. Pertinent negatives include no blurred vision, chest pain or palpitations. Risk factors for coronary artery disease include dyslipidemia and diabetes mellitus. The current treatment provides moderate improvement.     Past Medical History:  Diagnosis Date  . DM (diabetes mellitus) ([place])   . High cholesterol   . HTN (hypertension)      Family History  Problem Relation Age of Onset  . Diabetes Mother   . Healthy Father   . Breast  cancer Neg Hx   . Neuropathy Neg Hx      Current Outpatient Medications:  .  amLODipine (NORVASC) 5 MG tablet, Take 1 tablet (5 mg total) by mouth at bedtime., Disp: 90 tablet, Rfl: 1 .  aspirin EC 81 MG tablet, Take 1 tablet (81 mg total) by mouth daily. Swallow whole., Disp: 90 tablet, Rfl: 1 .  blood glucose meter kit and supplies KIT, Dispense based on patient and insurance preference. Use up to four times daily as directed. Dx code e11.65, Disp: 1 each, Rfl: 4 .  carvedilol (COREG) 6.25 MG tablet, Take 1 tablet (6.25 mg total) by mouth 2 (two) times daily with a meal., Disp: 180 tablet, Rfl: 3 .  DULoxetine (CYMBALTA) 30 MG capsule, Take 1 capsule (30 mg total) by mouth daily., Disp: 30 capsule, Rfl: 2 .  isosorbide mononitrate (IMDUR) 30 MG 24 hr tablet, Take 1 tablet (30 mg total) by mouth daily., Disp: 90 tablet, Rfl: 3 .  nitroGLYCERIN (NITROSTAT) 0.4 MG SL tablet, Place 1 tablet (0.4 mg total) under the tongue every 5 (five) minutes x 3 doses as needed for chest pain., Disp: 25 tablet, Rfl: 3 .  rosuvastatin (CRESTOR) 20 MG tablet, Take 1 tablet (20 mg total) by mouth daily., Disp: 90 tablet, Rfl: 3 .  Semaglutide, 1 MG/DOSE, (OZEMPIC, 1 MG/DOSE,) 4 MG/3ML SOPN, DIAL AND INJECT UNDER THE SKIN 1 MG WEEKLY (Patient taking differently: Inject 1 mg into the skin once a week.), Disp: 6 mL, Rfl: 2 .  valsartan (DIOVAN) 160 MG tablet, Take 1 tablet (160 mg total) by mouth daily., Disp: 90 tablet,  Rfl: 2   No Known Allergies   Review of Systems  Constitutional: Negative.   Eyes:  Negative for blurred vision.  Respiratory: Negative.    Cardiovascular: Negative.  Negative for chest pain and palpitations.  Gastrointestinal: Negative.   Neurological:  Positive for dizziness.  Psychiatric/Behavioral: Negative.       Today's Vitals   03/11/22 1435  BP: 106/74  Pulse: 84  Temp: 98.3 F (36.8 C)  SpO2: 98%  Weight: 172 lb 6.4 oz (78.2 kg)  Height: 5' 4"$  (1.626 m)   Body mass index is  29.59 kg/m.  Wt Readings from Last 3 Encounters:  03/11/22 172 lb 6.4 oz (78.2 kg)  12/08/21 175 lb 6.4 oz (79.6 kg)  12/01/21 174 lb 12.8 oz (79.3 kg)    Objective:  Physical Exam Vitals and nursing note reviewed.  Constitutional:      Appearance: Normal appearance.  HENT:     Head: Normocephalic and atraumatic.  Eyes:     Extraocular Movements: Extraocular movements intact.  Cardiovascular:     Rate and Rhythm: Normal rate and regular rhythm.     Heart sounds: Normal heart sounds.  Pulmonary:     Effort: Pulmonary effort is normal.     Breath sounds: Normal breath sounds.  Musculoskeletal:     Cervical back: Normal range of motion.  Skin:    General: Skin is warm.  Neurological:     General: No focal deficit present.     Mental Status: She is alert.  Psychiatric:        Mood and Affect: Mood normal.        Behavior: Behavior normal.        Assessment And Plan:     1. Uncontrolled type 2 diabetes mellitus with hyperglycemia ([place])  2. Hypertensive heart disease without heart failure  3. Anxiety  4. Estrogen deficiency  5. Need for shingles vaccine - Zoster Recombinant (Shingrix )  6. Body mass index (BMI) 29.0-29.9, adult     Patient was given opportunity to ask questions. Patient verbalized understanding of the plan and was able to repeat key elements of the plan. All questions were answered to their satisfaction.   I, Whitney Greenland, MD, have reviewed all documentation for this visit. The documentation on 03/11/22 for the exam, diagnosis, procedures, and orders are all accurate and complete.   IF YOU HAVE BEEN REFERRED TO A SPECIALIST, IT MAY TAKE 1-2 WEEKS TO SCHEDULE/PROCESS THE REFERRAL. IF YOU HAVE NOT HEARD FROM US/SPECIALIST IN TWO WEEKS, PLEASE GIVE [place] A CALL AT [phone].   THE PATIENT IS ENCOURAGED TO PRACTICE SOCIAL DISTANCING DUE TO THE COVID-19 PANDEMIC.

## 2022-03-12 ENCOUNTER — Ambulatory Visit (INDEPENDENT_AMBULATORY_CARE_PROVIDER_SITE_OTHER): Payer: Medicare PPO

## 2022-03-12 VITALS — Ht 64.0 in | Wt 172.0 lb

## 2022-03-12 DIAGNOSIS — Z0000 Encounter for general adult medical examination without abnormal findings: Secondary | ICD-10-CM

## 2022-03-12 DIAGNOSIS — Z6829 Body mass index (BMI) 29.0-29.9, adult: Secondary | ICD-10-CM | POA: Insufficient documentation

## 2022-03-12 DIAGNOSIS — I2510 Atherosclerotic heart disease of native coronary artery without angina pectoris: Secondary | ICD-10-CM | POA: Insufficient documentation

## 2022-03-12 DIAGNOSIS — E2839 Other primary ovarian failure: Secondary | ICD-10-CM | POA: Insufficient documentation

## 2022-03-12 LAB — CMP14+EGFR
ALT: 14 IU/L (ref 0–32)
AST: 18 IU/L (ref 0–40)
Albumin/Globulin Ratio: 2 (ref 1.2–2.2)
Albumin: 4.7 g/dL (ref 3.9–4.9)
Alkaline Phosphatase: 68 IU/L (ref 44–121)
BUN/Creatinine Ratio: 24 (ref 12–28)
BUN: 18 mg/dL (ref 8–27)
Bilirubin Total: 0.3 mg/dL (ref 0.0–1.2)
CO2: 23 mmol/L (ref 20–29)
Calcium: 9.6 mg/dL (ref 8.7–10.3)
Chloride: 107 mmol/L — ABNORMAL HIGH (ref 96–106)
Creatinine, Ser: 0.76 mg/dL (ref 0.57–1.00)
Globulin, Total: 2.4 g/dL (ref 1.5–4.5)
Glucose: 96 mg/dL (ref 70–99)
Potassium: 4.7 mmol/L (ref 3.5–5.2)
Sodium: 144 mmol/L (ref 134–144)
Total Protein: 7.1 g/dL (ref 6.0–8.5)
eGFR: 85 mL/min/{1.73_m2} (ref >59)

## 2022-03-12 LAB — HEMOGLOBIN A1C
Est. average glucose Bld gHb Est-mCnc: 120 mg/dL
Hgb A1c MFr Bld: 5.8 % — ABNORMAL HIGH (ref 4.8–5.6)

## 2022-03-12 NOTE — Progress Notes (Signed)
I connected with  Whitney Sanchez on 03/12/22 by a audio enabled telemedicine application and verified that I am speaking with the correct person using two identifiers.  Patient Location: Home  Provider Location: Office/Clinic  I discussed the limitations of evaluation and management by telemedicine. The patient expressed understanding and agreed to proceed.  Subjective:   Whitney Sanchez is a 67 y.o. female who presents for Medicare Annual (Subsequent) preventive examination.  Review of Systems     Cardiac Risk Factors include: advanced age (>43mn, >>65women);diabetes mellitus;dyslipidemia;hypertension     Objective:    Today's Vitals   03/12/22 1426  Weight: 172 lb (78 kg)  Height: 5' 4"$  (1.626 m)   Body mass index is 29.52 kg/m.     03/12/2022    2:29 PM 04/20/2021    9:00 PM 03/06/2021    3:32 PM 02/23/2020   10:54 AM  Advanced Directives  Does Patient Have a Medical Advance Directive? No No No No  Would patient like information on creating a medical advance directive?  No - Patient declined      Current Medications (verified) Outpatient Encounter Medications as of 03/12/2022  Medication Sig   amLODipine (NORVASC) 5 MG tablet Take 1 tablet (5 mg total) by mouth at bedtime.   aspirin EC 81 MG tablet Take 1 tablet (81 mg total) by mouth daily. Swallow whole.   blood glucose meter kit and supplies KIT Dispense based on patient and insurance preference. Use up to four times daily as directed. Dx code e11.65   carvedilol (COREG) 6.25 MG tablet Take 1 tablet (6.25 mg total) by mouth 2 (two) times daily with a meal.   DULoxetine (CYMBALTA) 30 MG capsule Take 1 capsule (30 mg total) by mouth daily.   isosorbide mononitrate (IMDUR) 30 MG 24 hr tablet Take 1 tablet (30 mg total) by mouth daily.   nitroGLYCERIN (NITROSTAT) 0.4 MG SL tablet Place 1 tablet (0.4 mg total) under the tongue every 5 (five) minutes x 3 doses as needed for chest pain.   rosuvastatin (CRESTOR) 20 MG  tablet Take 1 tablet (20 mg total) by mouth daily.   Semaglutide, 1 MG/DOSE, (OZEMPIC, 1 MG/DOSE,) 4 MG/3ML SOPN DIAL AND INJECT UNDER THE SKIN 1 MG WEEKLY (Patient taking differently: Inject 1 mg into the skin once a week.)   valsartan (DIOVAN) 160 MG tablet Take 1 tablet (160 mg total) by mouth daily.   No facility-administered encounter medications on file as of 03/12/2022.    Allergies (verified) Patient has no known allergies.   History: Past Medical History:  Diagnosis Date   [place]    High cholesterol    HTN (hypertension)    Past Surgical History:  Procedure Laterality Date   ABDOMINAL HYSTERECTOMY  1996   total   Family History  Problem Relation Age of Onset   Diabetes Mother    Healthy Father    Breast cancer Neg Hx    Neuropathy Neg Hx    Social History   Socioeconomic History   Marital status: Married    Spouse name: Not on file   Number of children: Not on file   Years of education: Not on file   Highest education level: Not on file  Occupational History   Not on file  Tobacco Use   Smoking status: Never    Passive exposure: Never   Smokeless tobacco: Never  Vaping Use   Vaping Use: Never used  Substance and Sexual Activity  Alcohol use: Never   Drug use: Never   Sexual activity: Not on file  Other Topics Concern   Not on file  Social History Narrative   Not on file   Social Determinants of Health   Financial Resource Strain: Low Risk  (03/12/2022)   Overall Financial Resource Strain (CARDIA)    Difficulty of Paying Living Expenses: Not hard at all  Recent Concern: Financial Resource Strain - High Risk (12/30/2021)   Overall Financial Resource Strain (CARDIA)    Difficulty of Paying Living Expenses: Very hard  Food Insecurity: No Food Insecurity (03/12/2022)   Hunger Vital Sign    Worried About Running Out of Food in the Last Year: Never true    Ran Out of Food in the Last Year: Never true  Transportation Needs: No  Transportation Needs (03/12/2022)   PRAPARE - Hydrologist (Medical): No    Lack of Transportation (Non-Medical): No  Physical Activity: Inactive (03/12/2022)   Exercise Vital Sign    Days of Exercise per Week: 0 days    Minutes of Exercise per Session: 0 min  Stress: No Stress Concern Present (03/12/2022)   [place]    Feeling of Stress : Not at all  Social Connections: Moderately Integrated (02/23/2020)   Social Connection and Isolation Panel [NHANES]    Frequency of Communication with Friends and Family: More than three times a week    Frequency of Social Gatherings with Friends and Family: Once a week    Attends Religious Services: More than 4 times per year    Active Member of [employer] or Organizations: No    Attends Music therapist: Never    Marital Status: Married    Tobacco Counseling Counseling given: Not Answered   Clinical Intake:  Pre-visit preparation completed: Yes  Pain : No/denies pain     Nutritional Status: BMI 25 -29 Overweight Nutritional Risks: None Diabetes: Yes  How often do you need to have someone help you when you read instructions, pamphlets, or other written materials from your doctor or pharmacy?: 1 - Never  Diabetic? Yes Nutrition Risk Assessment:  Has the patient had any N/V/D within the last 2 months?  No  Does the patient have any non-healing wounds?  No  Has the patient had any unintentional weight loss or weight gain?  No   Diabetes:  Is the patient diabetic?  Yes  If diabetic, was a CBG obtained today?  No  Did the patient bring in their glucometer from home?  No  How often do you monitor your CBG's? daily.   Financial Strains and Diabetes Management:  Are you having any financial strains with the device, your supplies or your medication? No .  Does the patient want to be seen by Chronic Care Management for management of  their diabetes?  No  Would the patient like to be referred to a Nutritionist or for Diabetic Management?  No   Diabetic Exams:  Diabetic Eye Exam: Overdue for diabetic eye exam. Pt has been advised about the importance in completing this exam. Patient advised to call and schedule an eye exam. Diabetic Foot Exam: Completed 12/01/2021   Interpreter Needed?: No  Information entered by :: NAllen LPN   Activities of Daily Living    03/12/2022    2:30 PM 04/20/2021    9:00 PM  In your present state of health, do you have any difficulty performing the following  activities:  Hearing? 0 0  Vision? 0 0  Difficulty concentrating or making decisions? 0 0  Walking or climbing stairs? 0 0  Dressing or bathing? 0 0  Doing errands, shopping? 0 0  Preparing Food and eating ? N   Using the Toilet? N   In the past six months, have you accidently leaked urine? N   Do you have problems with loss of bowel control? N   Managing your Medications? N   Managing your Finances? N   Housekeeping or managing your Housekeeping? N     Patient Care Team: Glendale Chard, MD as PCP - General (Internal Medicine) Werner Lean, MD as PCP - Cardiology (Cardiology) Mayford Knife, [facility] (Pharmacist)  Indicate any recent Medical Services you may have received from other than [facility] providers in the past year (date may be approximate).     Assessment:   This is a routine wellness examination for Whitney Sanchez.  Hearing/Vision screen Vision Screening - Comments:: Regular eye exams,   Dietary issues and exercise activities discussed: Current Exercise Habits: The patient does not participate in regular exercise at present   Goals Addressed             This Visit's Progress    Patient Stated       03/12/2022, no goals       Depression Screen    03/12/2022    2:30 PM 03/11/2022    2:34 PM 12/01/2021    9:17 AM 03/06/2021    3:32 PM 10/31/2020   11:22 AM 10/18/2019   11:01 AM 10/10/2018    2:25  PM  PHQ 2/9 Scores  PHQ - 2 Score 0 0 2 0 0 0 0  PHQ- 9 Score   3  0      Fall Risk    03/12/2022    2:29 PM 03/11/2022    2:33 PM 12/01/2021    9:17 AM 03/06/2021    3:32 PM 02/23/2020   10:57 AM  Fall Risk   Falls in the past year? 0 0 0 0 0  Number falls in past yr: 0 0 0    Injury with Fall? 0 0 0    Risk for fall due to : Medication side effect No Fall Risks No Fall Risks Medication side effect   Follow up Falls prevention discussed;Education provided;Falls evaluation completed Falls evaluation completed Falls evaluation completed Falls evaluation completed;Education provided;Falls prevention discussed     FALL RISK PREVENTION PERTAINING TO THE HOME:  Any stairs in or around the home? No  If so, are there any without handrails? N/a Home free of loose throw rugs in walkways, pet beds, electrical cords, etc? Yes  Adequate lighting in your home to reduce risk of falls? Yes   ASSISTIVE DEVICES UTILIZED TO PREVENT FALLS:  Life alert? No  Use of a cane, walker or w/c? No  Grab bars in the bathroom? Yes  Shower chair or bench in shower? No  Elevated toilet seat or a handicapped toilet? Yes   TIMED UP AND GO:  Was the test performed? No .       Cognitive Function:        03/12/2022    2:31 PM 03/06/2021    3:33 PM 02/23/2020    9:57 AM  6CIT Screen  What Year? 0 points 0 points 0 points  What month? 0 points 0 points 0 points  What time? 0 points 0 points 0 points  Count back from 20 0  points 0 points 0 points  Months in reverse 0 points 0 points 0 points  Repeat phrase 0 points 2 points 0 points  Total Score 0 points 2 points 0 points    Immunizations Immunization History  Administered Date(s) Administered   Fluad Quad(high Dose 65+) 10/18/2019, 12/31/2020, 12/01/2021   Influenza-Unspecified 10/19/2017   Moderna Covid-19 Vaccine Bivalent Booster 31yr & up 01/14/2021   Moderna SARS-COV2 Booster Vaccination 11/17/2019   Moderna Sars-Covid-2 Vaccination  01/21/2019, 02/18/2019, 01/14/2021   PNEUMOCOCCAL CONJUGATE-20 02/18/2021   Pneumococcal Polysaccharide-23 02/05/2015   Tdap 04/03/2014   Zoster Recombinat (Shingrix) 09/16/2021, 03/11/2022    TDAP status: Up to date  Flu Vaccine status: Up to date  Pneumococcal vaccine status: Up to date  Covid-19 vaccine status: Completed vaccines  Qualifies for Shingles Vaccine? Yes   Zostavax completed Yes   Shingrix Completed?: Yes  Screening Tests Health Maintenance  Topic Date Due   OPHTHALMOLOGY EXAM  05/29/2021   COVID-19 Vaccine (4 - 2023-24 season) 09/19/2021   Medicare Annual Wellness (AWV)  03/06/2022   HEMOGLOBIN A1C  09/09/2022   Diabetic kidney evaluation - Urine ACR  12/02/2022   FOOT EXAM  12/02/2022   Diabetic kidney evaluation - eGFR measurement  03/12/2023   MAMMOGRAM  02/05/2024   DTaP/Tdap/Td (2 - Td or Tdap) 04/02/2024   COLONOSCOPY (Pts 45-470yrInsurance coverage will need to be confirmed)  07/18/2024   Pneumonia Vaccine 69Years old  Completed   INFLUENZA VACCINE  Completed   DEXA SCAN  Completed   Hepatitis C Screening  Completed   Zoster Vaccines- Shingrix  Completed   HPV VACCINES  Aged Out    Health Maintenance  Health Maintenance Due  Topic Date Due   OPHTHALMOLOGY EXAM  05/29/2021   COVID-19 Vaccine (4 - 2023-24 season) 09/19/2021   Medicare Annual Wellness (AWV)  03/06/2022    Colorectal cancer screening: Type of screening: Colonoscopy. Completed 07/19/2014. Repeat every 10 years  Mammogram status: Completed 02/04/2022. Repeat every year  Bone Density status: scheduled for 05/25/2022  Lung Cancer Screening: (Low Dose CT Chest recommended if Age 69-80ears, 30 pack-year currently smoking OR have quit w/in 15years.) does not qualify.   Lung Cancer Screening Referral: no  Additional Screening:  Hepatitis C Screening: does qualify; Completed 11/10/2016  Vision Screening: Recommended annual ophthalmology exams for early detection of glaucoma  and other disorders of the eye. Is the patient up to date with their annual eye exam?  No  Who is the provider or what is the name of the office in which the patient attends annual eye exams? none If pt is not established with a provider, would they like to be referred to a provider to establish care? No .   Dental Screening: Recommended annual dental exams for proper oral hygiene  Community Resource Referral / Chronic Care Management: CRR required this visit?  No   CCM required this visit?  No      Plan:     I have personally reviewed and noted the following in the patient's chart:   Medical and social history Use of alcohol, tobacco or illicit drugs  Current medications and supplements including opioid prescriptions. Patient is not currently taking opioid prescriptions. Functional ability and status Nutritional status Physical activity Advanced directives List of other physicians Hospitalizations, surgeries, and ER visits in previous 12 months Vitals Screenings to include cognitive, depression, and falls Referrals and appointments  In addition, I have reviewed and discussed with patient certain preventive protocols, quality  metrics, and best practice recommendations. A written personalized care plan for preventive services as well as general preventive health recommendations were provided to patient.     Kellie Simmering, LPN   [id number]   Nurse Notes: none  Due to this being a virtual visit, the after visit summary with patients personalized plan was offered to patient via mail or my-chart.  to pick up at office at next visit

## 2022-03-12 NOTE — Patient Instructions (Signed)
Whitney Sanchez , Thank you for taking time to come for your Medicare Wellness Visit. I appreciate your ongoing commitment to your health goals. Please review the following plan we discussed and let me know if I can assist you in the future.   These are the goals we discussed:  Goals      Exercise 3x per week (30 min per time)     I would like to exercise at least 3-4 times a day     Manage My Medicine     Timeframe:  Long-Range Goal Priority:  High Start Date:                             Expected End Date:                       Follow Up Date 12/29/2022   In Progress:  - call for medicine refill 2 or 3 days before it runs out - call if I am sick and can't take my medicine - keep a list of all the medicines I take; vitamins and herbals too - learn to read medicine labels - use a pillbox to sort medicine    Why is this important?   These steps will help you keep on track with your medicines.   Notes:  -Please make sure to check your mail for follow up from patient assistance for Ozempic.      Patient Stated     03/06/2021, no goals     Patient Stated     03/12/2022, no goals        This is a list of the screening recommended for you and due dates:  Health Maintenance  Topic Date Due   Eye exam for diabetics  05/29/2021   COVID-19 Vaccine (4 - 2023-24 season) 09/19/2021   Hemoglobin A1C  09/09/2022   Yearly kidney health urinalysis for diabetes  12/02/2022   Complete foot exam   12/02/2022   Yearly kidney function blood test for diabetes  03/12/2023   Medicare Annual Wellness Visit  03/13/2023   Mammogram  02/05/2024   DTaP/Tdap/Td vaccine (2 - Td or Tdap) 04/02/2024   Colon Cancer Screening  07/18/2024   Pneumonia Vaccine  Completed   Flu Shot  Completed   DEXA scan (bone density measurement)  Completed   Hepatitis C Screening: USPSTF Recommendation to screen - Ages 33-79 yo.  Completed   Zoster (Shingles) Vaccine  Completed   HPV Vaccine  Aged Out    Advanced  directives: Advance directive discussed with you today. .  Conditions/risks identified: none  Next appointment: Follow up in one year for your annual wellness visit    Preventive Care 65 Years and Older, Female Preventive care refers to lifestyle choices and visits with your health care provider that can promote health and wellness. What does preventive care include? A yearly physical exam. This is also called an annual well check. Dental exams once or twice a year. Routine eye exams. Ask your health care provider how often you should have your eyes checked. Personal lifestyle choices, including: Daily care of your teeth and gums. Regular physical activity. Eating a healthy diet. Avoiding tobacco and drug use. Limiting alcohol use. Practicing safe sex. Taking low-dose aspirin every day. Taking vitamin and mineral supplements as recommended by your health care provider. What happens during an annual well check? The services and screenings done by your health care  provider during your annual well check will depend on your age, overall health, lifestyle risk factors, and family history of disease. Counseling  Your health care provider may ask you questions about your: Alcohol use. Tobacco use. Drug use. Emotional well-being. Home and relationship well-being. Sexual activity. Eating habits. History of falls. Memory and ability to understand (cognition). Work and work Statistician. Reproductive health. Screening  You may have the following tests or measurements: Height, weight, and BMI. Blood pressure. Lipid and cholesterol levels. These may be checked every 5 years, or more frequently if you are over 75 years old. Skin check. Lung cancer screening. You may have this screening every year starting at age 56 if you have a 30-pack-year history of smoking and currently smoke or have quit within the past 15 years. Fecal occult blood test (FOBT) of the stool. You may have this test  every year starting at age 51. Flexible sigmoidoscopy or colonoscopy. You may have a sigmoidoscopy every 5 years or a colonoscopy every 10 years starting at age 66. Hepatitis C blood test. Hepatitis B blood test. Sexually transmitted disease (STD) testing. Diabetes screening. This is done by checking your blood sugar (glucose) after you have not eaten for a while (fasting). You may have this done every 1-3 years. Bone density scan. This is done to screen for osteoporosis. You may have this done starting at age 47. Mammogram. This may be done every 1-2 years. Talk to your health care provider about how often you should have regular mammograms. Talk with your health care provider about your test results, treatment options, and if necessary, the need for more tests. Vaccines  Your health care provider may recommend certain vaccines, such as: Influenza vaccine. This is recommended every year. Tetanus, diphtheria, and acellular pertussis (Tdap, Td) vaccine. You may need a Td booster every 10 years. Zoster vaccine. You may need this after age 59. Pneumococcal 13-valent conjugate (PCV13) vaccine. One dose is recommended after age 64. Pneumococcal polysaccharide (PPSV23) vaccine. One dose is recommended after age 110. Talk to your health care provider about which screenings and vaccines you need and how often you need them. This information is not intended to replace advice given to you by your health care provider. Make sure you discuss any questions you have with your health care provider. Document Released: 02/01/2015 Document Revised: 09/25/2015 Document Reviewed: 11/06/2014 Elsevier Interactive Patient Education  2017 [employer] Prevention in the Home Falls can cause injuries. They can happen to people of all ages. There are many things you can do to make your home safe and to help prevent falls. What can I do on the outside of my home? Regularly fix the edges of walkways and driveways  and fix any cracks. Remove anything that might make you trip as you walk through a door, such as a raised step or threshold. Trim any bushes or trees on the path to your home. Use bright outdoor lighting. Clear any walking paths of anything that might make someone trip, such as rocks or tools. Regularly check to see if handrails are loose or broken. Make sure that both sides of any steps have handrails. Any raised decks and porches should have guardrails on the edges. Have any leaves, snow, or ice cleared regularly. Use sand or salt on walking paths during winter. Clean up any spills in your garage right away. This includes oil or grease spills. What can I do in the bathroom? Use night lights. Install grab bars by the  toilet and in the tub and shower. Do not use towel bars as grab bars. Use non-skid mats or decals in the tub or shower. If you need to sit down in the shower, use a plastic, non-slip stool. Keep the floor dry. Clean up any water that spills on the floor as soon as it happens. Remove soap buildup in the tub or shower regularly. Attach bath mats securely with double-sided non-slip rug tape. Do not have throw rugs and other things on the floor that can make you trip. What can I do in the bedroom? Use night lights. Make sure that you have a light by your bed that is easy to reach. Do not use any sheets or blankets that are too big for your bed. They should not hang down onto the floor. Have a firm chair that has side arms. You can use this for support while you get dressed. Do not have throw rugs and other things on the floor that can make you trip. What can I do in the kitchen? Clean up any spills right away. Avoid walking on wet floors. Keep items that you use a lot in easy-to-reach places. If you need to reach something above you, use a strong step stool that has a grab bar. Keep electrical cords out of the way. Do not use floor polish or wax that makes floors slippery. If  you must use wax, use non-skid floor wax. Do not have throw rugs and other things on the floor that can make you trip. What can I do with my stairs? Do not leave any items on the stairs. Make sure that there are handrails on both sides of the stairs and use them. Fix handrails that are broken or loose. Make sure that handrails are as long as the stairways. Check any carpeting to make sure that it is firmly attached to the stairs. Fix any carpet that is loose or worn. Avoid having throw rugs at the top or bottom of the stairs. If you do have throw rugs, attach them to the floor with carpet tape. Make sure that you have a light switch at the top of the stairs and the bottom of the stairs. If you do not have them, ask someone to add them for you. What else can I do to help prevent falls? Wear shoes that: Do not have high heels. Have rubber bottoms. Are comfortable and fit you well. Are closed at the toe. Do not wear sandals. If you use a stepladder: Make sure that it is fully opened. Do not climb a closed stepladder. Make sure that both sides of the stepladder are locked into place. Ask someone to hold it for you, if possible. Clearly mark and make sure that you can see: Any grab bars or handrails. First and last steps. Where the edge of each step is. Use tools that help you move around (mobility aids) if they are needed. These include: Canes. Walkers. Scooters. Crutches. Turn on the lights when you go into a dark area. Replace any light bulbs as soon as they burn out. Set up your furniture so you have a clear path. Avoid moving your furniture around. If any of your floors are uneven, fix them. If there are any pets around you, be aware of where they are. Review your medicines with your doctor. Some medicines can make you feel dizzy. This can increase your chance of falling. Ask your doctor what other things that you can do to help prevent falls.  This information is not intended to  replace advice given to you by your health care provider. Make sure you discuss any questions you have with your health care provider. Document Released: 11/01/2008 Document Revised: 06/13/2015 Document Reviewed: 02/09/2014 Elsevier Interactive Patient Education  2017 [employer].

## 2022-03-13 ENCOUNTER — Telehealth: Payer: Self-pay

## 2022-03-13 NOTE — Telephone Encounter (Signed)
VMT pt requesting call back reference PREP referral.

## 2022-03-24 ENCOUNTER — Telehealth: Payer: Self-pay

## 2022-03-24 NOTE — Progress Notes (Signed)
Care Management & Coordination Services Pharmacy Team  Reason for Encounter: Medication coordination and delivery  Contacted patient to discuss medications and coordinate delivery from [facility]. Spoke with patient on 03/24/2022  Cycle dispensing form sent to Whitney Sanchez for review.   Last adherence delivery date: 03-05-2022  Patient is due for next adherence delivery on: 04-03-2022  This delivery to include: Adherence Packaging  30 Days  Aspirin 81 mg at breakfast Carvedilol 6.25 mg at breakfast and with evening meal Duloxetine 20 mg at breakfast Imdur 30 mg at breakfast Rosuvastatin 20 mg at breakfast Valsartan 160 mg at breakfast Amlodipine 5 mg at bedtime Nitrostat 0.4 mg in vials Place 1 tablet (0.4 mg total) under the tongue every 5 (five) minutes x 3 doses as needed for chest pain. Add to delivery  Patient declined the following medications this month: Ozempic- PAP   No refill request needed.  Confirmed delivery date of 04-03-2022, advised patient that pharmacy will contact them the morning of delivery.   Any concerns about your medications? No  How often do you forget or accidentally miss a dose? Never  Do you use a pillbox? No  Is patient in packaging Yes  If yes  What is the date on your next pill pack? 03-06  Any concerns or issues with your packaging? No   Recent blood pressure readings are as follows: 106/74  Recent blood glucose readings are as follows: 95, 111   Chart review: Recent office visits:  03-12-2022 Kellie Simmering, LPN. Medicare wellness visit.   03-11-2022 Glendale Chard, MD. Chloride= 107. A1C= 5.8. Referral placed to PREP class. Shingrix given.  Recent consult visits:  None  Hospital visits:  None in previous 6 months  Medications: Outpatient Encounter Medications as of 03/24/2022  Medication Sig   amLODipine (NORVASC) 5 MG tablet Take 1 tablet (5 mg total) by mouth at bedtime.   aspirin EC 81 MG tablet Take 1 tablet  (81 mg total) by mouth daily. Swallow whole.   blood glucose meter kit and supplies KIT Dispense based on patient and insurance preference. Use up to four times daily as directed. Dx code e11.65   carvedilol (COREG) 6.25 MG tablet Take 1 tablet (6.25 mg total) by mouth 2 (two) times daily with a meal.   DULoxetine (CYMBALTA) 30 MG capsule Take 1 capsule (30 mg total) by mouth daily.   isosorbide mononitrate (IMDUR) 30 MG 24 hr tablet Take 1 tablet (30 mg total) by mouth daily.   nitroGLYCERIN (NITROSTAT) 0.4 MG SL tablet Place 1 tablet (0.4 mg total) under the tongue every 5 (five) minutes x 3 doses as needed for chest pain.   rosuvastatin (CRESTOR) 20 MG tablet Take 1 tablet (20 mg total) by mouth daily.   Semaglutide, 1 MG/DOSE, (OZEMPIC, 1 MG/DOSE,) 4 MG/3ML SOPN DIAL AND INJECT UNDER THE SKIN 1 MG WEEKLY (Patient taking differently: Inject 1 mg into the skin once a week.)   valsartan (DIOVAN) 160 MG tablet Take 1 tablet (160 mg total) by mouth daily.   No facility-administered encounter medications on file as of 03/24/2022.   BP Readings from Last 3 Encounters:  03/11/22 106/74  12/08/21 120/74  12/01/21 136/72    Pulse Readings from Last 3 Encounters:  03/11/22 84  12/08/21 83  12/01/21 89    Lab Results  Component Value Date/Time   HGBA1C 5.8 (H) 03/11/2022 03:32 PM   HGBA1C 5.8 (H) 12/01/2021 10:24 AM   HGBA1C 6.4 08/16/2017 12:00 AM   Lab  Results  Component Value Date   CREATININE 0.76 03/11/2022   BUN 18 03/11/2022   GFRNONAA >60 04/21/2021   GFRAA 80 02/23/2020   NA 144 03/11/2022   K 4.7 03/11/2022   CALCIUM 9.6 03/11/2022   CO2 23 03/11/2022     [place] Pharmacist Assistant [phone]

## 2022-04-23 ENCOUNTER — Telehealth: Payer: Self-pay

## 2022-04-23 ENCOUNTER — Other Ambulatory Visit: Payer: Self-pay

## 2022-04-23 DIAGNOSIS — F419 Anxiety disorder, unspecified: Secondary | ICD-10-CM

## 2022-04-23 MED ORDER — DULOXETINE HCL 30 MG PO CPEP: 30.0000 mg | ORAL_CAPSULE | Freq: Every day | ORAL | 2 refills | Status: DC

## 2022-04-23 NOTE — Progress Notes (Signed)
Care Management & Coordination Services Pharmacy Team  Reason for Encounter: Medication coordination and delivery  Contacted patient to discuss medications and coordinate delivery from [facility]. Spoke with patient on 04/23/2022  Cycle dispensing form sent to [name] for review.   Last adherence delivery date: 04-03-2022  Patient is due for next adherence delivery on: 05-05-2022  This delivery to include: Adherence Packaging  30 Days  Aspirin 81 mg at breakfast Carvedilol 6.25 mg at breakfast and with evening meal Duloxetine 20 mg at breakfast Imdur 30 mg at breakfast Rosuvastatin 20 mg at breakfast Valsartan 160 mg at breakfast Amlodipine 5 mg at bedtime  Patient declined the following medications this month: Nitrostat- [facility] supply Ozempic- PAP  Refills requested from providers include: Duloxetine   Confirmed delivery date of 05-05-2022, advised patient that pharmacy will contact them the morning of delivery.   Any concerns about your medications? No  How often do you forget or accidentally miss a dose? Never  Do you use a pillbox? No  Is patient in packaging Yes  If yes  What is the date on your next pill pack? 04-24-2022  Any concerns or issues with your packaging? No   Recent blood pressure readings are as follows: None at the moment. Patient stated she is working on getting a new machine. Last office BP 03-11-2022 106/74.  Recent blood glucose readings are as follows: 04-04 120, 04-03 97   Chart review: Recent office visits:  None  Recent consult visits:  None  Hospital visits:  None in previous 6 months  Medications: Outpatient Encounter Medications as of 04/23/2022  Medication Sig   amLODipine (NORVASC) 5 MG tablet Take 1 tablet (5 mg total) by mouth at bedtime.   aspirin EC 81 MG tablet Take 1 tablet (81 mg total) by mouth daily. Swallow whole.   blood glucose meter kit and supplies KIT Dispense based on patient and insurance  preference. Use up to four times daily as directed. Dx code e11.65   carvedilol (COREG) 6.25 MG tablet Take 1 tablet (6.25 mg total) by mouth 2 (two) times daily with a meal.   DULoxetine (CYMBALTA) 30 MG capsule Take 1 capsule (30 mg total) by mouth daily.   isosorbide mononitrate (IMDUR) 30 MG 24 hr tablet Take 1 tablet (30 mg total) by mouth daily.   nitroGLYCERIN (NITROSTAT) 0.4 MG SL tablet Place 1 tablet (0.4 mg total) under the tongue every 5 (five) minutes x 3 doses as needed for chest pain.   rosuvastatin (CRESTOR) 20 MG tablet Take 1 tablet (20 mg total) by mouth daily.   Semaglutide, 1 MG/DOSE, (OZEMPIC, 1 MG/DOSE,) 4 MG/3ML SOPN DIAL AND INJECT UNDER THE SKIN 1 MG WEEKLY (Patient taking differently: Inject 1 mg into the skin once a week.)   valsartan (DIOVAN) 160 MG tablet Take 1 tablet (160 mg total) by mouth daily.   No facility-administered encounter medications on file as of 04/23/2022.   BP Readings from Last 3 Encounters:  03/11/22 106/74  12/08/21 120/74  12/01/21 136/72    Pulse Readings from Last 3 Encounters:  03/11/22 84  12/08/21 83  12/01/21 89    Lab Results  Component Value Date/Time   HGBA1C 5.8 (H) 03/11/2022 03:32 PM   HGBA1C 5.8 (H) 12/01/2021 10:24 AM   HGBA1C 6.4 08/16/2017 12:00 AM   Lab Results  Component Value Date   CREATININE 0.76 03/11/2022   BUN 18 03/11/2022   GFRNONAA >60 04/21/2021   GFRAA 80 02/23/2020   NA  144 03/11/2022   K 4.7 03/11/2022   CALCIUM 9.6 03/11/2022   CO2 23 03/11/2022     [place] Clinical Pharmacist Assistant [phone]

## 2022-05-06 ENCOUNTER — Telehealth: Payer: Self-pay

## 2022-05-06 NOTE — Progress Notes (Addendum)
[employer] patient assistance program notification:  120- day supply of Ozempic was filled on 04/28/2022 and should arrive to the office in 10-14 business days. Patient has 1  refill remaining and enrollment will expire on 01/19/2023. Next refill will be fulfilled on 07/17/2022.  Refill form request completed by [name], RPH.  [name], CMA Clinical Pharmacist Assistant [phone]

## 2022-05-08 ENCOUNTER — Telehealth: Payer: Self-pay

## 2022-05-08 NOTE — Telephone Encounter (Signed)
Called patient to inform her that her Ozempic came in 1 mg, 4 boxes. Patient voiced understanding.  [name], CPP, PharmD Clinical Pharmacist Practitioner [facility] [phone]

## 2022-05-11 ENCOUNTER — Encounter: Payer: Self-pay | Admitting: Internal Medicine

## 2022-05-11 ENCOUNTER — Ambulatory Visit (INDEPENDENT_AMBULATORY_CARE_PROVIDER_SITE_OTHER): Payer: Medicare PPO | Admitting: Internal Medicine

## 2022-05-11 VITALS — BP 118/82 | HR 76 | Temp 98.4°F | Ht 64.0 in | Wt 172.0 lb

## 2022-05-11 DIAGNOSIS — I700 Atherosclerosis of aorta: Secondary | ICD-10-CM | POA: Diagnosis not present

## 2022-05-11 DIAGNOSIS — I119 Hypertensive heart disease without heart failure: Secondary | ICD-10-CM

## 2022-05-11 DIAGNOSIS — M25562 Pain in left knee: Secondary | ICD-10-CM | POA: Diagnosis not present

## 2022-05-11 MED ORDER — HYDROCODONE-ACETAMINOPHEN 5-325 MG PO TABS: 1.0000 | ORAL_TABLET | Freq: Four times a day (QID) | ORAL | 0 refills | Status: DC | PRN

## 2022-05-11 NOTE — Progress Notes (Signed)
I,[name],acting as a scribe for Gwynneth Aliment, MD.,have documented all relevant documentation on the behalf of Gwynneth Aliment, MD,as directed by  Gwynneth Aliment, MD while in the presence of Gwynneth Aliment, MD.    Subjective:     Patient ID: Whitney Sanchez , female    DOB: [birth year] , 67 y.o.   MRN: [id number]   Chief Complaint  Patient presents with   Leg Pain    HPI  Pt presents today for further evaluation of left leg pain.   Sx initially started on Friday.  Initially, she felt like there was something pulling in the back of her left knee. Denies fall/trauma.  She denies h/o blood clots.  She has taken Tylenol, Advil and used Vicks Vaporub topically w/o relief of the pain. Today, she rates the pain 10/10.   She denies recent long travel. Admits she is not exercising regularly. No h/o DVT. Denies cp/worsening sob on exertion.       Past Medical History:  Diagnosis Date   DM (diabetes mellitus) (HCC)    High cholesterol    HTN (hypertension)      Family History  Problem Relation Age of Onset   Diabetes Mother    Healthy Father    Breast cancer Neg Hx    Neuropathy Neg Hx      Current Outpatient Medications:    amLODipine (NORVASC) 5 MG tablet, Take 1 tablet (5 mg total) by mouth at bedtime., Disp: 90 tablet, Rfl: 1   aspirin EC 81 MG tablet, Take 1 tablet (81 mg total) by mouth daily. Swallow whole., Disp: 90 tablet, Rfl: 1   blood glucose meter kit and supplies KIT, Dispense based on patient and insurance preference. Use up to four times daily as directed. Dx code e11.65, Disp: 1 each, Rfl: 4   carvedilol (COREG) 6.25 MG tablet, Take 1 tablet (6.25 mg total) by mouth 2 (two) times daily with a meal., Disp: 180 tablet, Rfl: 3   DULoxetine (CYMBALTA) 30 MG capsule, Take 1 capsule (30 mg total) by mouth daily., Disp: 30 capsule, Rfl: 2   HYDROcodone-acetaminophen (NORCO/VICODIN) 5-325 MG tablet, Take 1 tablet by mouth every 6 (six) hours as needed for  moderate pain., Disp: 20 tablet, Rfl: 0   isosorbide mononitrate (IMDUR) 30 MG 24 hr tablet, Take 1 tablet (30 mg total) by mouth daily., Disp: 90 tablet, Rfl: 3   nitroGLYCERIN (NITROSTAT) 0.4 MG SL tablet, Place 1 tablet (0.4 mg total) under the tongue every 5 (five) minutes x 3 doses as needed for chest pain., Disp: 25 tablet, Rfl: 3   rosuvastatin (CRESTOR) 20 MG tablet, Take 1 tablet (20 mg total) by mouth daily., Disp: 90 tablet, Rfl: 3   Semaglutide, 1 MG/DOSE, (OZEMPIC, 1 MG/DOSE,) 4 MG/3ML SOPN, DIAL AND INJECT UNDER THE SKIN 1 MG WEEKLY (Patient taking differently: Inject 1 mg into the skin once a week.), Disp: 6 mL, Rfl: 2   valsartan (DIOVAN) 160 MG tablet, Take 1 tablet (160 mg total) by mouth daily., Disp: 90 tablet, Rfl: 2   No Known Allergies   Review of Systems  Constitutional: Negative.   Respiratory: Negative.    Cardiovascular: Negative.   Musculoskeletal:  Positive for arthralgias.  Skin: Negative.   Neurological: Negative.   Psychiatric/Behavioral: Negative.       Today's Vitals   05/11/22 1458  BP: 118/82  Pulse: 76  Temp: 98.4 F (36.9 C)  SpO2: 98%  Weight: 172 lb (78  kg)  Height: 5\' 4"  (1.626 m)   Wt Readings from Last 3 Encounters:  05/11/22 172 lb (78 kg)  03/12/22 172 lb (78 kg)  03/11/22 172 lb 6.4 oz (78.2 kg)      Objective:  Physical Exam Vitals and nursing note reviewed.  Constitutional:      Appearance: Normal appearance.  HENT:     Head: Normocephalic and atraumatic.     Mouth/Throat:     Pharynx: Posterior oropharyngeal erythema (hydrocodone) present.  Eyes:     Extraocular Movements: Extraocular movements intact.  Cardiovascular:     Rate and Rhythm: Normal rate and regular rhythm.     Heart sounds: Normal heart sounds.  Pulmonary:     Effort: Pulmonary effort is normal.     Breath sounds: Normal breath sounds.  Musculoskeletal:        General: Tenderness present.     Cervical back: Normal range of motion.     Comments:  Right calf  14.25 inches Left calf 14 inches  Skin:    General: Skin is warm.  Neurological:     General: No focal deficit present.     Mental Status: She is alert.  Psychiatric:        Mood and Affect: Mood normal.        Behavior: Behavior normal.         Assessment And Plan:     1. Posterior knee pain, left Comments: I will check D-dimer since she appears to be more sedentary recently. - D-dimer, quantitative (not at [facility])  2. Acute pain of left knee Comments: I think her sx are acute on chronic knee pain. She has b/l crepitus, not currently established w/ Ortho. Agrees to referral for radiographic studies and eval. - Ambulatory referral to Orthopedic Surgery  3. Hypertensive heart disease without heart failure Comments: Chronic, well controlled. She will c/w carvedilol 6.25mg  twice daily, amlodipine 5mg  and valsartan 160mg  daily. Encouraged to follow low sodium diet.  4. Aortic atherosclerosis (HCC) Comments: Chronic, LDL goal < 70.  She will c/w ASA 81mg  and rosuvastatin 20mg  daily. She is encouraged to follow a heart healthy lifestyle.     Patient was given opportunity to ask questions. Patient verbalized understanding of the plan and was able to repeat key elements of the plan. All questions were answered to their satisfaction.   I, Gwynneth Aliment, MD, have reviewed all documentation for this visit. The documentation on 05/11/22 for the exam, diagnosis, procedures, and orders are all accurate and complete.   IF YOU HAVE BEEN REFERRED TO A SPECIALIST, IT MAY TAKE 1-2 WEEKS TO SCHEDULE/PROCESS THE REFERRAL. IF YOU HAVE NOT HEARD FROM US/SPECIALIST IN TWO WEEKS, PLEASE GIVE [place] A CALL AT [phone].   THE PATIENT IS ENCOURAGED TO PRACTICE SOCIAL DISTANCING DUE TO THE COVID-19 PANDEMIC.

## 2022-05-11 NOTE — Patient Instructions (Signed)
Acute Knee Pain, Adult Acute knee pain is sudden and may be caused by damage, swelling, or irritation of the muscles and tissues that support the knee. Pain may result from: A fall. An injury to the knee from twisting motions. A hit to the knee. Infection. Acute knee pain may go away on its own with time and rest. If it does not, your health care provider may order tests to find the cause of the pain. These may include: Imaging tests, such as an X-ray, MRI, CT scan, or ultrasound. Joint aspiration. In this test, fluid is removed from the knee and evaluated. Arthroscopy. In this test, a lighted tube is inserted into the knee and an image is projected onto a TV screen. Biopsy. In this test, a sample of tissue is removed from the body and studied under a microscope. Follow these instructions at home: If you have a knee sleeve or brace:  Wear the knee sleeve or brace as told by your health care provider. Remove it only as told by your health care provider. Loosen it if your toes tingle, become numb, or turn cold and blue. Keep it clean. If the knee sleeve or brace is not waterproof: Do not let it get wet. Cover it with a watertight covering when you take a bath or shower. Activity Rest your knee. Do not do things that cause pain or make pain worse. Avoid high-impact activities or exercises, such as running, jumping rope, or doing jumping jacks. Work with a physical therapist to make a safe exercise program, as recommended by your health care provider. Do exercises as told by your physical therapist. Managing pain, stiffness, and swelling  If directed, put ice on the affected knee. To do this: If you have a removable knee sleeve or brace, remove it as told by your health care provider. Put ice in a plastic bag. Place a towel between your skin and the bag. Leave the ice on for 20 minutes, 2-3 times a day. Remove the ice if your skin turns bright red. This is very important. If you cannot  feel pain, heat, or cold, you have a greater risk of damage to the area. If directed, use an elastic bandage to put pressure (compression) on your injured knee. This may control swelling, give support, and help with discomfort. Raise (elevate) your knee above the level of your heart while you are sitting or lying down. Sleep with a pillow under your knee. General instructions Take over-the-counter and prescription medicines only as told by your health care provider. Do not use any products that contain nicotine or tobacco, such as cigarettes, e-cigarettes, and chewing tobacco. If you need help quitting, ask your health care provider. If you are overweight, work with your health care provider and a dietitian to set a weight-loss goal that is healthy and reasonable for you. Extra weight can put pressure on your knee. Pay attention to any changes in your symptoms. Keep all follow-up visits. This is important. Contact a health care provider if: Your knee pain continues, changes, or gets worse. You have a fever along with knee pain. Your knee feels warm to the touch or is red. Your knee buckles or locks up. Get help right away if: Your knee swells, and the swelling becomes worse. You cannot move your knee. You have severe pain in your knee that cannot be managed with pain medicine. Summary Acute knee pain can be caused by a fall, an injury, an infection, or damage, swelling, or irritation   of the tissues that support your knee. Your health care provider may perform tests to find out the cause of the pain. Pay attention to any changes in your symptoms. Relieve your pain with rest, medicines, light activity, and the use of ice. Get help right away if your knee swells, you cannot move your knee, or you have severe pain that cannot be managed with medicine. This information is not intended to replace advice given to you by your health care provider. Make sure you discuss any questions you have with  your health care provider. Document Revised: 06/21/2019 Document Reviewed: 06/21/2019 Elsevier Patient Education  2023 Elsevier Inc.  

## 2022-05-12 LAB — D-DIMER, QUANTITATIVE: D-DIMER: 0.66 mg/L FEU — ABNORMAL HIGH (ref 0.00–0.49)

## 2022-05-13 ENCOUNTER — Other Ambulatory Visit: Payer: Self-pay | Admitting: Internal Medicine

## 2022-05-13 DIAGNOSIS — M79605 Pain in left leg: Secondary | ICD-10-CM

## 2022-05-15 ENCOUNTER — Ambulatory Visit (HOSPITAL_COMMUNITY)
Admission: RE | Admit: 2022-05-15 | Discharge: 2022-05-15 | Disposition: A | Discharge status: Home / Self Care | Payer: Medicare PPO | Source: Ambulatory Visit | Attending: Internal Medicine | Admitting: Internal Medicine

## 2022-05-15 DIAGNOSIS — M79605 Pain in left leg: Secondary | ICD-10-CM | POA: Diagnosis not present

## 2022-05-15 NOTE — Progress Notes (Signed)
Left lower extremity venous study completed.   Attempted to call preliminary results to [name]' office, but office is closed on Fridays.  Please see CV Procedures for preliminary results.  Rashid [place]enight, RVT  9:15 AM 05/15/22

## 2022-05-19 ENCOUNTER — Telehealth: Payer: Self-pay

## 2022-05-19 NOTE — Progress Notes (Signed)
As of 05/11/2022 patient picked up 4 boxes of Ozempic, [employer] patient assistance from PCP office.    [name], CMA Clinical Pharmacist Assistant [phone]

## 2022-05-20 ENCOUNTER — Encounter: Payer: Self-pay | Admitting: Orthopaedic Surgery

## 2022-05-20 ENCOUNTER — Ambulatory Visit (INDEPENDENT_AMBULATORY_CARE_PROVIDER_SITE_OTHER): Payer: Medicare PPO | Admitting: Orthopaedic Surgery

## 2022-05-20 ENCOUNTER — Other Ambulatory Visit (INDEPENDENT_AMBULATORY_CARE_PROVIDER_SITE_OTHER): Payer: Medicare PPO

## 2022-05-20 VITALS — Ht 64.0 in | Wt 172.0 lb

## 2022-05-20 DIAGNOSIS — M25562 Pain in left knee: Secondary | ICD-10-CM

## 2022-05-20 NOTE — Progress Notes (Signed)
Office Visit Note   Patient: Whitney Sanchez           Date of Birth: 03-Mar-1955           MRN: [id number] Visit Date: 05/20/2022              Requested by: [name], MD [address] [place],  [place] [address] PCP: [name], MD   Assessment & Plan: Visit Diagnoses:  1. Acute pain of left knee     Plan: Impression is 67 year old female with left knee pain likely resolving osteoarthritis flare.  Treatment options discussed and she would like to take a prescription NSAID for a week or 2.  She will follow-up if symptoms persist.  Follow-Up Instructions: Return if symptoms worsen or fail to improve.   Orders:  Orders Placed This Encounter  Procedures   XR KNEE 3 VIEW LEFT   No orders of the defined types were placed in this encounter.     Procedures: No procedures performed   Clinical Data: No additional findings.   Subjective: Chief Complaint  Patient presents with   Left Knee - Pain    HPI  Patient is a very pleasant 67 year old female comes in for evaluation of 1 week history of left knee pain.  Denies any injuries or trauma.  Originally she was unable to weight-bear and went to the ER.  She was given an IM injection and was ruled out for DVT and Baker's cyst.  Pain has improved overall.  She endorses pain on the medial side and some pain on the posterior region.  Review of Systems  Constitutional: Negative.   HENT: Negative.    Eyes: Negative.   Respiratory: Negative.    Cardiovascular: Negative.   Endocrine: Negative.   Musculoskeletal: Negative.   Neurological: Negative.   Hematological: Negative.   Psychiatric/Behavioral: Negative.    All other systems reviewed and are negative.    Objective: Vital Signs: Ht 5\' 4"  (1.626 m)   Wt 172 lb (78 kg)   BMI 29.52 kg/m   Physical Exam Vitals and nursing note reviewed.  Constitutional:      Appearance: She is well-developed.  HENT:     Head: Atraumatic.     Nose: Nose  normal.  Eyes:     Extraocular Movements: Extraocular movements intact.  Cardiovascular:     Pulses: Normal pulses.  Pulmonary:     Effort: Pulmonary effort is normal.  Abdominal:     Palpations: Abdomen is soft.  Musculoskeletal:     Cervical back: Neck supple.  Skin:    General: Skin is warm.     Capillary Refill: Capillary refill takes less than 2 seconds.  Neurological:     Mental Status: She is alert. Mental status is at baseline.  Psychiatric:        Behavior: Behavior normal.        Thought Content: Thought content normal.        Judgment: Judgment normal.     Ortho Exam  Examination left knee shows no joint effusion.  No joint line tenderness.  Collaterals and cruciates are stable.  Full range of motion.  Specialty Comments:  No specialty comments available.  Imaging: XR KNEE 3 VIEW LEFT  Result Date: 05/20/2022 Small periarticular spurring of the medial compartment.  Preserved joint spaces.  No acute abnormalities.    PMFS History: Patient Active Problem List   Diagnosis Date Noted   Aortic atherosclerosis (HCC) 05/11/2022   Nonobstructive atherosclerosis  of coronary artery 03/12/2022   Body mass index (BMI) 29.0-29.9, adult 03/12/2022   Estrogen deficiency 03/12/2022   Palpitations 12/09/2021   DOE (dyspnea on exertion) 12/08/2021   Hypertensive heart disease without heart failure 12/03/2021   Chronic right shoulder pain 12/03/2021   Mixed hyperlipidemia 04/21/2021   Hypertensive urgency 04/21/2021   DM (diabetes mellitus), type 2 (HCC) 04/21/2021   Chest pain 04/20/2021   Myalgia 11/02/2020   Arthralgia 11/02/2020   Numbness and tingling of hand 09/18/2020   Uncontrolled type 2 diabetes mellitus with hyperglycemia (HCC) 03/17/2018   Essential hypertension 03/17/2018   Pure hypercholesterolemia 03/17/2018   Class 1 obesity due to excess calories with serious comorbidity and body mass index (BMI) of 32.0 to 32.9 in adult 03/17/2018   Past Medical  History:  Diagnosis Date   DM (diabetes mellitus) (HCC)    High cholesterol    HTN (hypertension)     Family History  Problem Relation Age of Onset   Diabetes Mother    Healthy Father    Breast cancer Neg Hx    Neuropathy Neg Hx     Past Surgical History:  Procedure Laterality Date   ABDOMINAL HYSTERECTOMY  1996   total   Social History   Occupational History   Not on file  Tobacco Use   Smoking status: Never    Passive exposure: Never   Smokeless tobacco: Never  Vaping Use   Vaping Use: Never used  Substance and Sexual Activity   Alcohol use: Never   Drug use: Never   Sexual activity: Not on file

## 2022-05-21 ENCOUNTER — Telehealth: Payer: Self-pay | Admitting: Orthopaedic Surgery

## 2022-05-21 MED ORDER — DICLOFENAC SODIUM 75 MG PO TBEC
75.0000 mg | DELAYED_RELEASE_TABLET | Freq: Two times a day (BID) | ORAL | 2 refills | Status: DC
Start: 2022-05-21 — End: 2023-04-28

## 2022-05-21 NOTE — Telephone Encounter (Signed)
Done.  diclofenac

## 2022-05-21 NOTE — Addendum Note (Signed)
Addended by: Mayra Reel on: 05/21/2022 12:22 PM   Modules accepted: Orders

## 2022-05-21 NOTE — Telephone Encounter (Signed)
Patient asking about medication that Dr. Roda Shutters advised would help her. Please call into [facility] on [address] in [place]. Please advise patient when done

## 2022-05-22 ENCOUNTER — Telehealth: Payer: Self-pay

## 2022-05-22 NOTE — Progress Notes (Signed)
Care Management & Coordination Services Pharmacy Team  Reason for Encounter: Medication coordination and delivery  Contacted patient to discuss medications and coordinate delivery from [facility]. Spoke with patient on 05/22/2022  Cycle dispensing form sent to [name] for review.   Last adherence delivery date: 05-05-2022  Patient is due for next adherence delivery on: 06-03-2022  This delivery to include: Adherence Packaging  30 Days  Aspirin 81 mg at breakfast Carvedilol 6.25 mg at breakfast and with evening meal Duloxetine 20 mg at breakfast Imdur 30 mg at breakfast Rosuvastatin 20 mg at breakfast Valsartan 160 mg at breakfast Amlodipine 5 mg at bedtime  Patient declined the following medications this month: Nitrostat- [facility] supply Ozempic- PAP Hydro/Ace- Filled at [facility] on 05-11-2022 5 DS for temporary use  No refill request needed.  Confirmed delivery date of 06-03-2022, advised patient that pharmacy will contact them the morning of delivery.   Any concerns about your medications? No  How often do you forget or accidentally miss a dose? Never  Do you use a pillbox? No  Is patient in packaging Yes  If yes  What is the date on your next pill pack? Patient wasn't around packs to clarify  Any concerns or issues with your packaging? No   Recent blood pressure readings are as follows: Patient stated she is working on getting a new machine. Last office visit BP 05-11-2022 118/82  Recent blood glucose readings are as follows: 05-02 97, 05-01 124, 04-30 92   Chart review: Recent office visits:  05-11-2022 [name], MD (PCP). Visit for Posterior knee pain, left. Start hydrocodone-Ace 5-325 mg every 6 hours PRN. Referral placed to Orthopedic surgery.  Recent consult visits:  05-20-2022 Tarry Kos, MD (Orthopedic surgery). Visit for acute pain in left knee. XR left knee completed.  05-15-2022 Leonie Douglas, MD (Vascular ultrasound). PR  DUP-SCAN XTR VEINS UNILATERAL/LIMITED STUDY and HC US VENOUS IMG UNILATERAL completed.  Hospital visits:  None in previous 6 months  Medications: Outpatient Encounter Medications as of 05/22/2022  Medication Sig   amLODipine (NORVASC) 5 MG tablet Take 1 tablet (5 mg total) by mouth at bedtime.   aspirin EC 81 MG tablet Take 1 tablet (81 mg total) by mouth daily. Swallow whole.   blood glucose meter kit and supplies KIT Dispense based on patient and insurance preference. Use up to four times daily as directed. Dx code e11.65   carvedilol (COREG) 6.25 MG tablet Take 1 tablet (6.25 mg total) by mouth 2 (two) times daily with a meal.   diclofenac (VOLTAREN) 75 MG EC tablet Take 1 tablet (75 mg total) by mouth 2 (two) times daily.   DULoxetine (CYMBALTA) 30 MG capsule Take 1 capsule (30 mg total) by mouth daily.   HYDROcodone-acetaminophen (NORCO/VICODIN) 5-325 MG tablet Take 1 tablet by mouth every 6 (six) hours as needed for moderate pain.   isosorbide mononitrate (IMDUR) 30 MG 24 hr tablet Take 1 tablet (30 mg total) by mouth daily.   nitroGLYCERIN (NITROSTAT) 0.4 MG SL tablet Place 1 tablet (0.4 mg total) under the tongue every 5 (five) minutes x 3 doses as needed for chest pain.   rosuvastatin (CRESTOR) 20 MG tablet Take 1 tablet (20 mg total) by mouth daily.   Semaglutide, 1 MG/DOSE, (OZEMPIC, 1 MG/DOSE,) 4 MG/3ML SOPN DIAL AND INJECT UNDER THE SKIN 1 MG WEEKLY (Patient taking differently: Inject 1 mg into the skin once a week.)   valsartan (DIOVAN) 160 MG tablet Take 1 tablet (160  mg total) by mouth daily.   No facility-administered encounter medications on file as of 05/22/2022.   BP Readings from Last 3 Encounters:  05/11/22 118/82  03/11/22 106/74  12/08/21 120/74    Pulse Readings from Last 3 Encounters:  05/11/22 76  03/11/22 84  12/08/21 83    Lab Results  Component Value Date/Time   HGBA1C 5.8 (H) 03/11/2022 03:32 PM   HGBA1C 5.8 (H) 12/01/2021 10:24 AM   HGBA1C 6.4  08/16/2017 12:00 AM   Lab Results  Component Value Date   CREATININE 0.76 03/11/2022   BUN 18 03/11/2022   GFRNONAA >60 04/21/2021   GFRAA 80 02/23/2020   NA 144 03/11/2022   K 4.7 03/11/2022   CALCIUM 9.6 03/11/2022   CO2 23 03/11/2022     [name] [facility] Clinical Pharmacist Assistant [phone]

## 2022-05-22 NOTE — Telephone Encounter (Signed)
Called Ms. Arcidiacono due to a review of her medication and noted hat she was started on pain medicine. She reports that she is feeling much better. I discussed the importance of bowel regimen, and the common side effect of constipation with pain medication, in particular Hydrocone/APAP. She reported understanding and confirmed that she has colace  and or senokot available to take at home. She reports that she started the Hydrocodone last Monday and she has not had any issues with going to the bathroom.  [name], CPP, PharmD Clinical Pharmacist Practitioner [facility] [phone]

## 2022-05-25 ENCOUNTER — Ambulatory Visit
Admission: RE | Admit: 2022-05-25 | Discharge: 2022-05-25 | Disposition: A | Discharge status: Home / Self Care | Payer: Medicare PPO | Source: Ambulatory Visit | Attending: Internal Medicine | Admitting: Internal Medicine

## 2022-05-25 DIAGNOSIS — E349 Endocrine disorder, unspecified: Secondary | ICD-10-CM | POA: Diagnosis not present

## 2022-05-25 DIAGNOSIS — E119 Type 2 diabetes mellitus without complications: Secondary | ICD-10-CM | POA: Diagnosis not present

## 2022-05-25 DIAGNOSIS — Z90710 Acquired absence of both cervix and uterus: Secondary | ICD-10-CM | POA: Diagnosis not present

## 2022-05-25 DIAGNOSIS — E2839 Other primary ovarian failure: Secondary | ICD-10-CM

## 2022-05-25 DIAGNOSIS — M810 Age-related osteoporosis without current pathological fracture: Secondary | ICD-10-CM | POA: Diagnosis not present

## 2022-05-25 DIAGNOSIS — Z1231 Encounter for screening mammogram for malignant neoplasm of breast: Secondary | ICD-10-CM

## 2022-05-25 DIAGNOSIS — R29890 Loss of height: Secondary | ICD-10-CM | POA: Diagnosis not present

## 2022-05-25 DIAGNOSIS — Z90722 Acquired absence of ovaries, bilateral: Secondary | ICD-10-CM | POA: Diagnosis not present

## 2022-06-09 ENCOUNTER — Ambulatory Visit (INDEPENDENT_AMBULATORY_CARE_PROVIDER_SITE_OTHER): Payer: Medicare PPO | Admitting: Internal Medicine

## 2022-06-09 ENCOUNTER — Encounter: Payer: Self-pay | Admitting: Internal Medicine

## 2022-06-09 VITALS — BP 116/70 | HR 80 | Temp 98.1°F | Ht 61.8 in | Wt 171.2 lb

## 2022-06-09 DIAGNOSIS — Z6831 Body mass index (BMI) 31.0-31.9, adult: Secondary | ICD-10-CM | POA: Diagnosis not present

## 2022-06-09 DIAGNOSIS — E1169 Type 2 diabetes mellitus with other specified complication: Secondary | ICD-10-CM | POA: Diagnosis not present

## 2022-06-09 DIAGNOSIS — E785 Hyperlipidemia, unspecified: Secondary | ICD-10-CM | POA: Diagnosis not present

## 2022-06-09 DIAGNOSIS — E6609 Other obesity due to excess calories: Secondary | ICD-10-CM

## 2022-06-09 DIAGNOSIS — I700 Atherosclerosis of aorta: Secondary | ICD-10-CM | POA: Diagnosis not present

## 2022-06-09 DIAGNOSIS — I119 Hypertensive heart disease without heart failure: Secondary | ICD-10-CM | POA: Diagnosis not present

## 2022-06-09 NOTE — Progress Notes (Signed)
Jeri Cos [place],acting as a Neurosurgeon for Gwynneth Aliment, MD.,have documented all relevant documentation on the behalf of Gwynneth Aliment, MD,as directed by  Gwynneth Aliment, MD while in the presence of Gwynneth Aliment, MD.    Subjective:     Patient ID: Whitney Sanchez , female    DOB: November 13, 1955 , 67 y.o.   MRN: [id number]   Chief Complaint  Patient presents with   Diabetes   Hypertension    HPI  She presents today for diabetes/HTN f/u.  She reports compliance with meds.  She denies headaches, chest pain and SOB.  Patient does not have any questions or concerns at this time.  Wt Readings from Last 3 Encounters: 06/09/22 : 171 lb 3.2 oz (77.7 kg) 05/20/22 : 172 lb (78 kg) 05/11/22 : 172 lb (78 kg)    Diabetes She presents for her follow-up diabetic visit. She has type 2 diabetes mellitus. Her disease course has been stable. Pertinent negatives for hypoglycemia include no dizziness. Pertinent negatives for diabetes include no blurred vision, no polydipsia, no polyphagia and no polyuria. There are no hypoglycemic complications. Risk factors for coronary artery disease include diabetes mellitus, dyslipidemia, hypertension, sedentary lifestyle, post-menopausal and obesity. She participates in exercise intermittently. An ACE inhibitor/angiotensin II receptor blocker is being taken.  Hypertension This is a chronic problem. The current episode started more than 1 year ago. The problem has been gradually improving since onset. The problem is uncontrolled. Pertinent negatives include no blurred vision. Risk factors for coronary artery disease include dyslipidemia and diabetes mellitus. The current treatment provides moderate improvement.     Past Medical History:  Diagnosis Date   DM (diabetes mellitus) (HCC)    High cholesterol    HTN (hypertension)      Family History  Problem Relation Age of Onset   Diabetes Mother    Healthy Father    Breast cancer Neg Hx    Neuropathy  Neg Hx      Current Outpatient Medications:    amLODipine (NORVASC) 5 MG tablet, Take 1 tablet (5 mg total) by mouth at bedtime., Disp: 90 tablet, Rfl: 1   aspirin EC 81 MG tablet, Take 1 tablet (81 mg total) by mouth daily. Swallow whole., Disp: 90 tablet, Rfl: 1   blood glucose meter kit and supplies KIT, Dispense based on patient and insurance preference. Use up to four times daily as directed. Dx code e11.65, Disp: 1 each, Rfl: 4   carvedilol (COREG) 6.25 MG tablet, Take 1 tablet (6.25 mg total) by mouth 2 (two) times daily with a meal., Disp: 180 tablet, Rfl: 3   diclofenac (VOLTAREN) 75 MG EC tablet, Take 1 tablet (75 mg total) by mouth 2 (two) times daily., Disp: 30 tablet, Rfl: 2   DULoxetine (CYMBALTA) 30 MG capsule, Take 1 capsule (30 mg total) by mouth daily., Disp: 30 capsule, Rfl: 2   isosorbide mononitrate (IMDUR) 30 MG 24 hr tablet, Take 1 tablet (30 mg total) by mouth daily., Disp: 90 tablet, Rfl: 3   nitroGLYCERIN (NITROSTAT) 0.4 MG SL tablet, Place 1 tablet (0.4 mg total) under the tongue every 5 (five) minutes x 3 doses as needed for chest pain., Disp: 25 tablet, Rfl: 3   rosuvastatin (CRESTOR) 20 MG tablet, Take 1 tablet (20 mg total) by mouth daily., Disp: 90 tablet, Rfl: 3   Semaglutide, 1 MG/DOSE, (OZEMPIC, 1 MG/DOSE,) 4 MG/3ML SOPN, DIAL AND INJECT UNDER THE SKIN 1 MG WEEKLY (Patient taking differently: Inject 1  mg into the skin once a week.), Disp: 6 mL, Rfl: 2   valsartan (DIOVAN) 160 MG tablet, Take 1 tablet (160 mg total) by mouth daily., Disp: 90 tablet, Rfl: 2   No Known Allergies   Review of Systems  Constitutional: Negative.   HENT: Negative.    Eyes: Negative.  Negative for blurred vision.  Cardiovascular: Negative.   Gastrointestinal: Negative.   Endocrine: Negative for polydipsia, polyphagia and polyuria.  Musculoskeletal: Negative.   Skin: Negative.   Neurological:  Negative for dizziness.  Psychiatric/Behavioral: Negative.       Today's Vitals    06/09/22 1156  BP: 116/70  Pulse: 80  Temp: 98.1 F (36.7 C)  Weight: 171 lb 3.2 oz (77.7 kg)  Height: 5' 1.8" (1.57 m)  PainSc: 0-No pain   Body mass index is 31.52 kg/m.  The 10-year ASCVD risk score (Arnett DK, et al., 2019) is: 14.8%   Values used to calculate the score:     Age: 79 years     Sex: Female     Is Non-Hispanic African American: Yes     Diabetic: Yes     Tobacco smoker: No     Systolic Blood Pressure: 116 mmHg     Is BP treated: Yes     HDL Cholesterol: 52 mg/dL     Total Cholesterol: 133 mg/dL ++ Objective:  Physical Exam Vitals and nursing note reviewed.  Constitutional:      Appearance: Normal appearance.  HENT:     Head: Normocephalic and atraumatic.  Eyes:     Extraocular Movements: Extraocular movements intact.  Cardiovascular:     Rate and Rhythm: Normal rate and regular rhythm.     Heart sounds: Normal heart sounds.  Pulmonary:     Effort: Pulmonary effort is normal.     Breath sounds: Normal breath sounds.  Musculoskeletal:     Cervical back: Normal range of motion.  Skin:    General: Skin is warm.  Neurological:     General: No focal deficit present.     Mental Status: She is alert.  Psychiatric:        Mood and Affect: Mood normal.        Behavior: Behavior normal.         Assessment And Plan:     1. Dyslipidemia associated with type 2 diabetes mellitus (HCC) Comments: Chronic, LDL goal < 70. She will c/w weekly semaglutide and rosuvastatin daily. - Lipid panel - Hemoglobin A1c - BMP8+EGFR  2. Hypertensive heart disease without heart failure Comments: Chronic, well controlled. She will c/w amlodipine 5mg , valsartan 160mg , carvedilol 6.25mg  bid and metoprolol daily. - BMP8+EGFR  3. Aortic atherosclerosis (HCC) Comments: Chronic, LDL goal < 70. She will c/w ASA 81mg  and rosuvastatin 20mg  daily.  4. Class 1 obesity due to excess calories with serious comorbidity and body mass index (BMI) of 31.0 to 31.9 in adult Comments:  She is encouraged to aim for at least 150 minutes of exercise/week, while striving for BMI<29 to decrease cardiac risk.    Return for controlled DM check-4 months.  Patient was given opportunity to ask questions. Patient verbalized understanding of the plan and was able to repeat key elements of the plan. All questions were answered to their satisfaction.   I, Gwynneth Aliment, MD, have reviewed all documentation for this visit. The documentation on 06/09/22 for the exam, diagnosis, procedures, and orders are all accurate and complete.   IF YOU HAVE BEEN REFERRED TO A SPECIALIST,  IT MAY TAKE 1-2 WEEKS TO SCHEDULE/PROCESS THE REFERRAL. IF YOU HAVE NOT HEARD FROM US/SPECIALIST IN TWO WEEKS, PLEASE GIVE [place] A CALL AT [phone] X [phone].   THE PATIENT IS ENCOURAGED TO PRACTICE SOCIAL DISTANCING DUE TO THE COVID-19 PANDEMIC.

## 2022-06-09 NOTE — Patient Instructions (Signed)

## 2022-06-10 LAB — BMP8+EGFR
BUN/Creatinine Ratio: 19 (ref 12–28)
BUN: 15 mg/dL (ref 8–27)
CO2: 21 mmol/L (ref 20–29)
Calcium: 9.5 mg/dL (ref 8.7–10.3)
Chloride: 105 mmol/L (ref 96–106)
Creatinine, Ser: 0.79 mg/dL (ref 0.57–1.00)
Glucose: 78 mg/dL (ref 70–99)
Potassium: 4.5 mmol/L (ref 3.5–5.2)
Sodium: 139 mmol/L (ref 134–144)
eGFR: 81 mL/min/{1.73_m2} (ref >59)

## 2022-06-10 LAB — HEMOGLOBIN A1C
Est. average glucose Bld gHb Est-mCnc: 123 mg/dL
Hgb A1c MFr Bld: 5.9 % — ABNORMAL HIGH (ref 4.8–5.6)

## 2022-06-10 LAB — LIPID PANEL
Chol/HDL Ratio: 2.6 ratio (ref 0.0–4.4)
Cholesterol, Total: 133 mg/dL (ref 100–199)
HDL: 52 mg/dL (ref >39)
LDL Chol Calc (NIH): 56 mg/dL (ref 0–99)
Triglycerides: 148 mg/dL (ref 0–149)
VLDL Cholesterol Cal: 25 mg/dL (ref 5–40)

## 2022-06-22 ENCOUNTER — Telehealth: Payer: Self-pay

## 2022-06-22 ENCOUNTER — Other Ambulatory Visit: Payer: Self-pay

## 2022-06-22 MED ORDER — ROSUVASTATIN CALCIUM 20 MG PO TABS
20.0000 mg | ORAL_TABLET | Freq: Every day | ORAL | 3 refills | Status: DC
  Filled 2022-10-14: qty 30, 30d supply, fill #0
  Filled 2022-11-09: qty 30, 30d supply, fill #1
  Filled 2022-11-30 – 2022-12-02 (×2): qty 30, 30d supply, fill #2
  Filled 2022-12-30 – 2022-12-31 (×2): qty 30, 30d supply, fill #3
  Filled 2023-01-18 – 2023-01-26 (×2): qty 30, 30d supply, fill #4
  Filled 2023-02-15 – 2023-02-18 (×2): qty 30, 30d supply, fill #5
  Filled 2023-03-04 – 2023-03-16 (×2): qty 30, 30d supply, fill #6
  Filled 2023-03-25 – 2023-04-08 (×2): qty 30, 30d supply, fill #7

## 2022-06-22 MED ORDER — AMLODIPINE BESYLATE 5 MG PO TABS
5.0000 mg | ORAL_TABLET | Freq: Every evening | ORAL | 1 refills | Status: DC
  Filled 2022-10-14: qty 30, 30d supply, fill #0
  Filled 2022-11-09: qty 30, 30d supply, fill #1
  Filled 2022-11-30 – 2022-12-02 (×2): qty 30, 30d supply, fill #2

## 2022-06-22 MED ORDER — ISOSORBIDE MONONITRATE ER 30 MG PO TB24
30.0000 mg | ORAL_TABLET | Freq: Every day | ORAL | 3 refills | Status: DC
  Filled 2022-10-14: qty 30, 30d supply, fill #0
  Filled 2022-11-09: qty 30, 30d supply, fill #1
  Filled 2022-11-30 – 2022-12-02 (×2): qty 30, 30d supply, fill #2
  Filled 2022-12-30 – 2022-12-31 (×2): qty 30, 30d supply, fill #3
  Filled 2023-01-18 – 2023-01-26 (×2): qty 30, 30d supply, fill #4
  Filled 2023-02-15 – 2023-02-18 (×2): qty 30, 30d supply, fill #5
  Filled 2023-03-04 – 2023-03-16 (×2): qty 30, 30d supply, fill #6
  Filled 2023-03-25 – 2023-04-08 (×2): qty 30, 30d supply, fill #7

## 2022-06-22 MED ORDER — CARVEDILOL 6.25 MG PO TABS
6.2500 mg | ORAL_TABLET | Freq: Two times a day (BID) | ORAL | 3 refills | Status: DC
  Filled 2022-10-14: qty 60, 30d supply, fill #0
  Filled 2022-11-09: qty 60, 30d supply, fill #1
  Filled 2022-11-30 – 2022-12-02 (×2): qty 60, 30d supply, fill #2
  Filled 2022-12-30 – 2022-12-31 (×2): qty 60, 30d supply, fill #3
  Filled 2023-01-18 – 2023-01-26 (×2): qty 60, 30d supply, fill #4
  Filled 2023-02-15 – 2023-02-18 (×2): qty 60, 30d supply, fill #5
  Filled 2023-03-04 – 2023-03-16 (×2): qty 60, 30d supply, fill #6
  Filled 2023-03-25 – 2023-04-08 (×2): qty 60, 30d supply, fill #7

## 2022-06-22 NOTE — Progress Notes (Signed)
Care Management & Coordination Services Pharmacy Team  Reason for Encounter: Medication coordination and delivery  Contacted patient to discuss medications and coordinate delivery from [facility]. Spoke with patient on 06/22/2022  Cycle dispensing form sent to Whitney Sanchez for review.   Last adherence delivery date: 06-03-2022   Patient is due for next adherence delivery on: 07-02-2022  This delivery to include: Adherence Packaging  30 Days  Aspirin 81 mg at breakfast Carvedilol 6.25 mg at breakfast and with evening meal Duloxetine 20 mg at breakfast Imdur 30 mg at breakfast Rosuvastatin 20 mg at breakfast Valsartan 160 mg at breakfast Amlodipine 5 mg at bedtime  Patient declined the following medications this month: Nitrostat- [facility] supply Ozempic- PAP  Refills requested from providers include: Imdur  Carvedilol  Amlodipine  Rosuvastatin   Confirmed delivery date of 07-02-2022, advised patient that pharmacy will contact them the morning of delivery.   Any concerns about your medications? No  How often do you forget or accidentally miss a dose? Never  Do you use a pillbox? No  Is patient in packaging Yes  Any concerns or issues with your packaging? No   Recent blood pressure readings are as follows: None available due to not having BP monitor. Sent message to the [facility] team.  Recent blood glucose readings are as follows: 06-03 108, 06-02 97   Chart review: Recent office visits:  06-09-2022 Whitney Peng, MD. Follow up visit for DM and HTN. Return visit for DM check in 4 months.  Recent consult visits:  None  Hospital visits:  None in previous 6 months  Medications: Outpatient Encounter Medications as of 06/22/2022  Medication Sig   amLODipine (NORVASC) 5 MG tablet Take 1 tablet (5 mg total) by mouth at bedtime.   aspirin EC 81 MG tablet Take 1 tablet (81 mg total) by mouth daily. Swallow whole.   blood glucose meter kit and supplies KIT Dispense  based on patient and insurance preference. Use up to four times daily as directed. Dx code e11.65   carvedilol (COREG) 6.25 MG tablet Take 1 tablet (6.25 mg total) by mouth 2 (two) times daily with a meal.   diclofenac (VOLTAREN) 75 MG EC tablet Take 1 tablet (75 mg total) by mouth 2 (two) times daily.   DULoxetine (CYMBALTA) 30 MG capsule Take 1 capsule (30 mg total) by mouth daily.   isosorbide mononitrate (IMDUR) 30 MG 24 hr tablet Take 1 tablet (30 mg total) by mouth daily.   nitroGLYCERIN (NITROSTAT) 0.4 MG SL tablet Place 1 tablet (0.4 mg total) under the tongue every 5 (five) minutes x 3 doses as needed for chest pain.   rosuvastatin (CRESTOR) 20 MG tablet Take 1 tablet (20 mg total) by mouth daily.   Semaglutide, 1 MG/DOSE, (OZEMPIC, 1 MG/DOSE,) 4 MG/3ML SOPN DIAL AND INJECT UNDER THE SKIN 1 MG WEEKLY (Patient taking differently: Inject 1 mg into the skin once a week.)   valsartan (DIOVAN) 160 MG tablet Take 1 tablet (160 mg total) by mouth daily.   No facility-administered encounter medications on file as of 06/22/2022.   BP Readings from Last 3 Encounters:  06/09/22 116/70  05/11/22 118/82  03/11/22 106/74    Pulse Readings from Last 3 Encounters:  06/09/22 80  05/11/22 76  03/11/22 84    Lab Results  Component Value Date/Time   HGBA1C 5.9 (H) 06/09/2022 04:27 PM   HGBA1C 5.8 (H) 03/11/2022 03:32 PM   HGBA1C 6.4 08/16/2017 12:00 AM   Lab Results  Component Value Date   CREATININE 0.79 06/09/2022   BUN 15 06/09/2022   GFRNONAA >60 04/21/2021   GFRAA 80 02/23/2020   NA 139 06/09/2022   K 4.5 06/09/2022   CALCIUM 9.5 06/09/2022   CO2 21 06/09/2022     Whitney Sanchez [facility] Clinical Pharmacist Assistant [phone]

## 2022-07-27 ENCOUNTER — Other Ambulatory Visit: Payer: Self-pay | Admitting: Internal Medicine

## 2022-07-27 DIAGNOSIS — F419 Anxiety disorder, unspecified: Secondary | ICD-10-CM

## 2022-08-18 ENCOUNTER — Telehealth: Payer: Self-pay

## 2022-08-18 NOTE — Telephone Encounter (Signed)
Spoke with patient. PA ready for pick up.

## 2022-09-03 ENCOUNTER — Ambulatory Visit (INDEPENDENT_AMBULATORY_CARE_PROVIDER_SITE_OTHER): Payer: Medicare PPO | Admitting: Internal Medicine

## 2022-09-03 ENCOUNTER — Encounter: Payer: Self-pay | Admitting: Internal Medicine

## 2022-09-03 VITALS — BP 130/82 | HR 75 | Temp 98.6°F | Ht 61.0 in | Wt 173.6 lb

## 2022-09-03 DIAGNOSIS — E1169 Type 2 diabetes mellitus with other specified complication: Secondary | ICD-10-CM | POA: Diagnosis not present

## 2022-09-03 DIAGNOSIS — E6609 Other obesity due to excess calories: Secondary | ICD-10-CM

## 2022-09-03 DIAGNOSIS — D649 Anemia, unspecified: Secondary | ICD-10-CM | POA: Diagnosis not present

## 2022-09-03 DIAGNOSIS — E66811 Other obesity due to excess calories: Secondary | ICD-10-CM

## 2022-09-03 DIAGNOSIS — I119 Hypertensive heart disease without heart failure: Secondary | ICD-10-CM | POA: Diagnosis not present

## 2022-09-03 DIAGNOSIS — E785 Hyperlipidemia, unspecified: Secondary | ICD-10-CM

## 2022-09-03 DIAGNOSIS — Z6832 Body mass index (BMI) 32.0-32.9, adult: Secondary | ICD-10-CM

## 2022-09-03 DIAGNOSIS — R0602 Shortness of breath: Secondary | ICD-10-CM | POA: Diagnosis not present

## 2022-09-03 NOTE — Progress Notes (Signed)
7I,[name], CMA,acting as a Neurosurgeon for Gwynneth Aliment, MD.,have documented all relevant documentation on the behalf of Gwynneth Aliment, MD,as directed by  Gwynneth Aliment, MD while in the presence of Gwynneth Aliment, MD.  Subjective:  Patient ID: Whitney Sanchez , female    DOB: [birth year] , 67 y.o.   MRN: [id number]  Chief Complaint  Patient presents with   Shortness of Breath    HPI  Patient presents today for shortness of breath. She states her sx started several months ago.  She thinks one of her medications is contributing to her sx. She admits she does not know which medication caused her sx.  She adds experiencing chest tightness along with dizziness. She has yet to take any meds today, so she feels fine at the moment. She admits she is not getting much exercise.          Past Medical History:  Diagnosis Date   DM (diabetes mellitus) (HCC)    High cholesterol    HTN (hypertension)      Family History  Problem Relation Age of Onset   Diabetes Mother    Healthy Father    Breast cancer Neg Hx    Neuropathy Neg Hx      Current Outpatient Medications:    amLODipine (NORVASC) 5 MG tablet, Take 1 tablet (5 mg total) by mouth at bedtime., Disp: 90 tablet, Rfl: 1   aspirin EC 81 MG tablet, Take 1 tablet (81 mg total) by mouth daily. Swallow whole., Disp: 90 tablet, Rfl: 1   blood glucose meter kit and supplies KIT, Dispense based on patient and insurance preference. Use up to four times daily as directed. Dx code e11.65, Disp: 1 each, Rfl: 4   carvedilol (COREG) 6.25 MG tablet, Take 1 tablet (6.25 mg total) by mouth 2 (two) times daily with a meal., Disp: 180 tablet, Rfl: 3   diclofenac (VOLTAREN) 75 MG EC tablet, Take 1 tablet (75 mg total) by mouth 2 (two) times daily., Disp: 30 tablet, Rfl: 2   DULoxetine (CYMBALTA) 30 MG capsule, TAKE ONE CAPSULE BY MOUTH ONCE DAILY, Disp: 90 capsule, Rfl: 1   isosorbide mononitrate (IMDUR) 30 MG 24 hr tablet, Take 1 tablet  (30 mg total) by mouth daily., Disp: 90 tablet, Rfl: 3   nitroGLYCERIN (NITROSTAT) 0.4 MG SL tablet, Place 1 tablet (0.4 mg total) under the tongue every 5 (five) minutes x 3 doses as needed for chest pain., Disp: 25 tablet, Rfl: 3   rosuvastatin (CRESTOR) 20 MG tablet, Take 1 tablet (20 mg total) by mouth daily., Disp: 90 tablet, Rfl: 3   Semaglutide, 1 MG/DOSE, (OZEMPIC, 1 MG/DOSE,) 4 MG/3ML SOPN, DIAL AND INJECT UNDER THE SKIN 1 MG WEEKLY (Patient taking differently: Inject 1 mg into the skin once a week.), Disp: 6 mL, Rfl: 2   valsartan (DIOVAN) 160 MG tablet, Take 1 tablet (160 mg total) by mouth daily., Disp: 90 tablet, Rfl: 2   No Known Allergies   Review of Systems  Constitutional:  Positive for fatigue.  HENT: Negative.    Eyes: Negative.   Respiratory:  Positive for shortness of breath.   Cardiovascular: Negative.   Gastrointestinal: Negative.   Neurological: Negative.   Psychiatric/Behavioral: Negative.       Today's Vitals   09/03/22 0924 09/03/22 0936  BP: (!) 130/90 130/82  Pulse: 75   Temp: 98.6 F (37 C)   SpO2: 98%   Weight: 173 lb 9.6 oz (78.7  kg)   Height: 5\' 1"  (1.549 m)    Body mass index is 32.8 kg/m.  Wt Readings from Last 3 Encounters:  09/03/22 173 lb 9.6 oz (78.7 kg)  06/09/22 171 lb 3.2 oz (77.7 kg)  05/20/22 172 lb (78 kg)     Objective:  Physical Exam Vitals and nursing note reviewed.  Constitutional:      Appearance: Normal appearance. She is well-developed.  HENT:     Head: Normocephalic and atraumatic.  Eyes:     Extraocular Movements: Extraocular movements intact.  Cardiovascular:     Rate and Rhythm: Normal rate and regular rhythm.     Heart sounds: Normal heart sounds.  Pulmonary:     Effort: Pulmonary effort is normal.     Breath sounds: Normal breath sounds.  Skin:    General: Skin is warm.  Neurological:     General: No focal deficit present.     Mental Status: She is alert.  Psychiatric:        Mood and Affect: Mood  normal.        Behavior: Behavior normal.         Assessment And Plan:  SOB (shortness of breath) Assessment & Plan: I will check labs as below. Unfortunately, her medications are in pill packs from [facility], she is unable to pinpoint which medication is causing her issues. She does not wish to see Cardiology, she states "they stated she was fine". Most recent Cardiology note was reviewed. I will make further recommendations once her labs are available for review.   Orders: -     Brain natriuretic peptide -     CBC -     EKG 12-Lead  Hypertensive heart disease without heart failure Assessment & Plan: Chronic, fair control.  She will continue with amlodipine 5mg , carvedilol 6.25mg  twice daily and valsartan 160mg  daily. Advised to follow low sodium diet.   Orders: -     EKG 12-Lead  Dyslipidemia associated with type 2 diabetes mellitus (HCC) Assessment & Plan: Chronic, currently on weekly semaglutide 1mg  weekly. Importance of dietary/medication compliance was discussed with the patient.   Orders: -     Hemoglobin A1c -     CMP14+EGFR -     TSH  Class 1 obesity due to excess calories with serious comorbidity and body mass index (BMI) of 32.0 to 32.9 in adult Assessment & Plan: She is encouraged to strive for BMI less than 30 to decrease cardiac risk. Advised to aim for at least 150 minutes of exercise per week.     Other orders -     Iron and TIBC -     Ferritin -     Specimen status report  She is encouraged to strive for BMI less than 30 to decrease cardiac risk. Advised to aim for at least 150 minutes of exercise per week.    Return if symptoms worsen or fail to improve.  Patient was given opportunity to ask questions. Patient verbalized understanding of the plan and was able to repeat key elements of the plan. All questions were answered to their satisfaction.   I, Gwynneth Aliment, MD, have reviewed all documentation for this visit. The documentation on 09/03/22 for  the exam, diagnosis, procedures, and orders are all accurate and complete.   IF YOU HAVE BEEN REFERRED TO A SPECIALIST, IT MAY TAKE 1-2 WEEKS TO SCHEDULE/PROCESS THE REFERRAL. IF YOU HAVE NOT HEARD FROM US/SPECIALIST IN TWO WEEKS, PLEASE GIVE [place] A CALL AT [phone]  X [phone].   THE PATIENT IS ENCOURAGED TO PRACTICE SOCIAL DISTANCING DUE TO THE COVID-19 PANDEMIC.

## 2022-09-03 NOTE — Patient Instructions (Signed)
Shortness of Breath, Adult Shortness of breath means you have trouble breathing. Shortness of breath could be a sign of a medical problem. Follow these instructions at home:  Pollution Do not smoke or use any products that contain nicotine or tobacco. If you need help quitting, ask your doctor. Avoid things that can make it harder to breathe, such as: Smoke of all kinds. This includes smoke from campfires or forest fires. Do not smoke or allow others to smoke in your home. Mold. Dust. Air pollution. Chemical smells. Things that can give you an allergic reaction (allergens) if you have allergies. Keep your living space clean. Use products that help remove mold and dust. General instructions Watch for any changes in your symptoms. Take over-the-counter and prescription medicines only as told by your doctor. This includes oxygen therapy and inhaled medicines. Rest as needed. Return to your normal activities when your doctor says that it is safe. Keep all follow-up visits. Contact a doctor if: Your condition does not get better as soon as expected. You have a hard time doing your normal activities, even after you rest. You have new symptoms. You cannot walk up stairs. You cannot exercise the way you normally do. Get help right away if: Your shortness of breath gets worse. You have trouble breathing when you are resting. You feel light-headed or you faint. You have a cough that is not helped by medicines. You cough up blood. You have pain with breathing. You have pain in your chest, arms, shoulders, or belly (abdomen). You have a fever. These symptoms may be an emergency. Get help right away. Call 911. Do not wait to see if the symptoms will go away. Do not drive yourself to the hospital. Summary Shortness of breath is when you have trouble breathing enough air. It can be a sign of a medical problem. Avoid things that make it hard for you to breathe, such as smoking, pollution,  mold, and dust. Watch for any changes in your symptoms. Contact your doctor if you do not get better or you get worse. This information is not intended to replace advice given to you by your health care provider. Make sure you discuss any questions you have with your health care provider. Document Revised: 08/24/2020 Document Reviewed: 08/24/2020 Elsevier Patient Education  2024 [employer] Inc.  

## 2022-09-04 ENCOUNTER — Other Ambulatory Visit: Payer: Self-pay | Admitting: Orthopaedic Surgery

## 2022-09-04 LAB — CBC
Hematocrit: 33.5 % — ABNORMAL LOW (ref 34.0–46.6)
Hemoglobin: 10 g/dL — ABNORMAL LOW (ref 11.1–15.9)
MCH: 24.5 pg — ABNORMAL LOW (ref 26.6–33.0)
MCHC: 29.9 g/dL — ABNORMAL LOW (ref 31.5–35.7)
MCV: 82 fL (ref 79–97)
Platelets: 304 10*3/uL (ref 150–450)
RBC: 4.08 x10E6/uL (ref 3.77–5.28)
RDW: 16.1 % — ABNORMAL HIGH (ref 11.7–15.4)
WBC: 6.7 10*3/uL (ref 3.4–10.8)

## 2022-09-04 LAB — CMP14+EGFR
ALT: 12 IU/L (ref 0–32)
AST: 17 IU/L (ref 0–40)
Albumin: 4.6 g/dL (ref 3.9–4.9)
Alkaline Phosphatase: 79 IU/L (ref 44–121)
BUN/Creatinine Ratio: 15 (ref 12–28)
BUN: 13 mg/dL (ref 8–27)
Bilirubin Total: 0.4 mg/dL (ref 0.0–1.2)
CO2: 20 mmol/L (ref 20–29)
Calcium: 9.3 mg/dL (ref 8.7–10.3)
Chloride: 103 mmol/L (ref 96–106)
Creatinine, Ser: 0.84 mg/dL (ref 0.57–1.00)
Globulin, Total: 2.8 g/dL (ref 1.5–4.5)
Glucose: 88 mg/dL (ref 70–99)
Potassium: 4.5 mmol/L (ref 3.5–5.2)
Sodium: 139 mmol/L (ref 134–144)
Total Protein: 7.4 g/dL (ref 6.0–8.5)
eGFR: 75 mL/min/{1.73_m2} (ref >59)

## 2022-09-04 LAB — BRAIN NATRIURETIC PEPTIDE: BNP: <2.5 pg/mL (ref 0.0–100.0)

## 2022-09-04 LAB — HEMOGLOBIN A1C
Est. average glucose Bld gHb Est-mCnc: 128 mg/dL
Hgb A1c MFr Bld: 6.1 % — ABNORMAL HIGH (ref 4.8–5.6)

## 2022-09-04 LAB — TSH: TSH: 0.98 u[IU]/mL (ref 0.450–4.500)

## 2022-09-08 LAB — IRON AND TIBC
Iron Saturation: 11 % — ABNORMAL LOW (ref 15–55)
Iron: 43 ug/dL (ref 27–139)
Total Iron Binding Capacity: 376 ug/dL (ref 250–450)
UIBC: 333 ug/dL (ref 118–369)

## 2022-09-08 LAB — FERRITIN: Ferritin: 14 ng/mL — ABNORMAL LOW (ref 15–150)

## 2022-09-08 LAB — SPECIMEN STATUS REPORT

## 2022-09-13 NOTE — Assessment & Plan Note (Signed)
Chronic, fair control.  She will continue with amlodipine 5mg , carvedilol 6.25mg  twice daily and valsartan 160mg  daily. Advised to follow low sodium diet.

## 2022-09-13 NOTE — Assessment & Plan Note (Signed)
I will check labs as below. Unfortunately, her medications are in pill packs from [facility], she is unable to pinpoint which medication is causing her issues. She does not wish to see Cardiology, she states "they stated she was fine". Most recent Cardiology note was reviewed. I will make further recommendations once her labs are available for review.

## 2022-09-13 NOTE — Assessment & Plan Note (Signed)
She is encouraged to strive for BMI less than 30 to decrease cardiac risk. Advised to aim for at least 150 minutes of exercise per week.  

## 2022-09-13 NOTE — Assessment & Plan Note (Signed)
Chronic, currently on weekly semaglutide 1mg  weekly. Importance of dietary/medication compliance was discussed with the patient.

## 2022-09-17 ENCOUNTER — Other Ambulatory Visit: Payer: Self-pay

## 2022-09-17 ENCOUNTER — Other Ambulatory Visit (HOSPITAL_COMMUNITY): Payer: Self-pay

## 2022-09-18 ENCOUNTER — Other Ambulatory Visit (HOSPITAL_COMMUNITY): Payer: Self-pay

## 2022-09-22 ENCOUNTER — Other Ambulatory Visit: Payer: Self-pay

## 2022-09-23 ENCOUNTER — Other Ambulatory Visit: Payer: Self-pay

## 2022-09-23 ENCOUNTER — Other Ambulatory Visit (HOSPITAL_COMMUNITY): Payer: Self-pay

## 2022-09-23 ENCOUNTER — Other Ambulatory Visit: Payer: Self-pay | Admitting: Internal Medicine

## 2022-09-23 MED ORDER — NITROGLYCERIN 0.4 MG SL SUBL
SUBLINGUAL_TABLET | SUBLINGUAL | 3 refills | Status: AC
  Filled 2022-09-23: qty 25, 8d supply, fill #0
  Filled 2022-09-29: qty 25, 8d supply, fill #1
  Filled 2022-10-07: qty 25, 8d supply, fill #2
  Filled 2022-10-15: qty 25, 8d supply, fill #3

## 2022-09-24 ENCOUNTER — Other Ambulatory Visit (HOSPITAL_COMMUNITY): Payer: Self-pay

## 2022-09-24 ENCOUNTER — Other Ambulatory Visit: Payer: Self-pay

## 2022-09-26 ENCOUNTER — Other Ambulatory Visit: Payer: Self-pay

## 2022-09-26 MED FILL — Duloxetine HCl Enteric Coated Pellets Cap 30 MG (Base Eq): ORAL | 90 days supply | Qty: 90 | Fill #0 | Status: CN

## 2022-09-28 ENCOUNTER — Other Ambulatory Visit (HOSPITAL_COMMUNITY): Payer: Self-pay

## 2022-10-07 ENCOUNTER — Other Ambulatory Visit: Payer: Self-pay

## 2022-10-12 ENCOUNTER — Encounter: Payer: Self-pay | Admitting: Internal Medicine

## 2022-10-12 ENCOUNTER — Ambulatory Visit: Payer: Medicare PPO | Admitting: Internal Medicine

## 2022-10-12 VITALS — BP 110/70 | HR 77 | Temp 97.1°F | Ht 61.0 in | Wt 171.0 lb

## 2022-10-12 DIAGNOSIS — Z1211 Encounter for screening for malignant neoplasm of colon: Secondary | ICD-10-CM | POA: Diagnosis not present

## 2022-10-12 DIAGNOSIS — K649 Unspecified hemorrhoids: Secondary | ICD-10-CM | POA: Insufficient documentation

## 2022-10-12 DIAGNOSIS — Z2821 Immunization not carried out because of patient refusal: Secondary | ICD-10-CM

## 2022-10-12 DIAGNOSIS — D649 Anemia, unspecified: Secondary | ICD-10-CM | POA: Insufficient documentation

## 2022-10-12 DIAGNOSIS — I119 Hypertensive heart disease without heart failure: Secondary | ICD-10-CM

## 2022-10-12 DIAGNOSIS — I700 Atherosclerosis of aorta: Secondary | ICD-10-CM | POA: Diagnosis not present

## 2022-10-12 DIAGNOSIS — K644 Residual hemorrhoidal skin tags: Secondary | ICD-10-CM | POA: Diagnosis not present

## 2022-10-12 DIAGNOSIS — D508 Other iron deficiency anemias: Secondary | ICD-10-CM

## 2022-10-12 LAB — POC HEMOCCULT BLD/STL (OFFICE/1-CARD/DIAGNOSTIC): Fecal Occult Blood, POC: NEGATIVE

## 2022-10-12 MED ORDER — HYDROCORTISONE ACETATE 25 MG RE SUPP: 25.0000 mg | Freq: Two times a day (BID) | RECTAL | 0 refills | Status: DC | PRN

## 2022-10-12 NOTE — Progress Notes (Signed)
I,Jameka J Llittleton, CMA,acting as a Neurosurgeon for Gwynneth Aliment, MD.,have documented all relevant documentation on the behalf of Gwynneth Aliment, MD,as directed by  Gwynneth Aliment, MD while in the presence of Gwynneth Aliment, MD.  Subjective:  Patient ID: Whitney Sanchez , female    DOB: 11-01-1955 , 67 y.o.   MRN: [id number]  Chief Complaint  Patient presents with   Diabetes    HPI  She presents today for f/u anemia. She was started iron supplementation at her last visit. She finished samples and then started OTC iron. Her shortness of breath has improved.  She denies headaches, chest pain and SOB.  Patient does not have any questions or concerns at this time.    Diabetes She presents for her follow-up diabetic visit. She has type 2 diabetes mellitus. Her disease course has been stable. Pertinent negatives for hypoglycemia include no dizziness. Pertinent negatives for diabetes include no blurred vision, no polydipsia, no polyphagia and no polyuria. There are no hypoglycemic complications. Risk factors for coronary artery disease include diabetes mellitus, dyslipidemia, hypertension, sedentary lifestyle, post-menopausal and obesity. She participates in exercise intermittently. An ACE inhibitor/angiotensin II receptor blocker is being taken.  Hypertension This is a chronic problem. The current episode started more than 1 year ago. The problem has been gradually improving since onset. The problem is uncontrolled. Pertinent negatives include no blurred vision. Risk factors for coronary artery disease include dyslipidemia and diabetes mellitus. The current treatment provides moderate improvement.     Past Medical History:  Diagnosis Date   DM (diabetes mellitus) (HCC)    High cholesterol    HTN (hypertension)      Family History  Problem Relation Age of Onset   Diabetes Mother    Healthy Father    Breast cancer Neg Hx    Neuropathy Neg Hx      Current Outpatient Medications:     amLODipine (NORVASC) 5 MG tablet, Take 1 tablet (5 mg total) by mouth at bedtime., Disp: 90 tablet, Rfl: 1   aspirin EC 81 MG tablet, Take 1 tablet (81 mg total) by mouth daily. Swallow whole., Disp: 90 tablet, Rfl: 1   blood glucose meter kit and supplies KIT, Dispense based on patient and insurance preference. Use up to four times daily as directed. Dx code e11.65, Disp: 1 each, Rfl: 4   carvedilol (COREG) 6.25 MG tablet, Take 1 tablet (6.25 mg total) by mouth 2 (two) times daily with a meal., Disp: 180 tablet, Rfl: 3   diclofenac (VOLTAREN) 75 MG EC tablet, Take 1 tablet (75 mg total) by mouth 2 (two) times daily., Disp: 30 tablet, Rfl: 2   DULoxetine (CYMBALTA) 30 MG capsule, Take 1 capsule (30 mg total) by mouth daily., Disp: 90 capsule, Rfl: 1   hydrocortisone (ANUSOL-HC) 25 MG suppository, Place 1 suppository (25 mg total) rectally 2 (two) times daily as needed for hemorrhoids or anal itching., Disp: 12 suppository, Rfl: 0   isosorbide mononitrate (IMDUR) 30 MG 24 hr tablet, Take 1 tablet (30 mg total) by mouth daily., Disp: 90 tablet, Rfl: 3   nitroGLYCERIN (NITROSTAT) 0.4 MG SL tablet, Place 1 tablet (0.4 mg total) under the tongue every 5 (five) minutes x 3 doses as needed for chest pain. If no relief after 3 doses, call 911., Disp: 25 tablet, Rfl: 3   rosuvastatin (CRESTOR) 20 MG tablet, Take 1 tablet (20 mg total) by mouth daily., Disp: 90 tablet, Rfl: 3   Semaglutide, 1 MG/DOSE, (  OZEMPIC, 1 MG/DOSE,) 4 MG/3ML SOPN, DIAL AND INJECT UNDER THE SKIN 1 MG WEEKLY (Patient taking differently: Inject 1 mg into the skin once a week.), Disp: 6 mL, Rfl: 2   valsartan (DIOVAN) 160 MG tablet, Take 1 tablet (160 mg total) by mouth daily., Disp: 90 tablet, Rfl: 2   No Known Allergies   Review of Systems  Constitutional: Negative.   Eyes: Negative.  Negative for blurred vision.  Respiratory: Negative.    Cardiovascular: Negative.   Endocrine: Negative for polydipsia, polyphagia and polyuria.   Musculoskeletal: Negative.   Skin: Negative.   Neurological:  Negative for dizziness.  Psychiatric/Behavioral: Negative.       Today's Vitals   10/12/22 1120 10/12/22 1208  BP: 116/80 110/70  Pulse: 77   Temp: (!) 97.1 F (36.2 C)   Weight: 171 lb (77.6 kg)   Height: 5\' 1"  (1.549 m)   PainSc: 0-No pain    Body mass index is 32.31 kg/m.  Wt Readings from Last 3 Encounters:  10/12/22 171 lb (77.6 kg)  09/03/22 173 lb 9.6 oz (78.7 kg)  06/09/22 171 lb 3.2 oz (77.7 kg)    The 10-year ASCVD risk score (Arnett DK, et al., 2019) is: 13.4%   Values used to calculate the score:     Age: 14 years     Sex: Female     Is Non-Hispanic African American: Yes     Diabetic: Yes     Tobacco smoker: No     Systolic Blood Pressure: 110 mmHg     Is BP treated: Yes     HDL Cholesterol: 52 mg/dL     Total Cholesterol: 133 mg/dL  Objective:  Physical Exam Vitals and nursing note reviewed.  Constitutional:      Appearance: Normal appearance. She is obese.  HENT:     Head: Normocephalic and atraumatic.  Eyes:     Extraocular Movements: Extraocular movements intact.  Cardiovascular:     Rate and Rhythm: Normal rate and regular rhythm.     Heart sounds: Normal heart sounds.  Pulmonary:     Effort: Pulmonary effort is normal.     Breath sounds: Normal breath sounds.  Genitourinary:    Rectum: Guaiac result negative. External hemorrhoid present. No anal fissure.  Musculoskeletal:     Cervical back: Normal range of motion.  Skin:    General: Skin is warm.  Neurological:     General: No focal deficit present.     Mental Status: She is alert.  Psychiatric:        Mood and Affect: Mood normal.        Behavior: Behavior normal.         Assessment And Plan:  Other iron deficiency anemia Assessment & Plan: Her SOB has improved, I will check CBC and iron studies today. I will make further recommendations once her labs are available for review.   Orders: -     CBC with  Differential/Platelet -     Iron, TIBC and Ferritin Panel  External hemorrhoid Assessment & Plan: I will send rx Anusol suppository for her to use tid prn.    Hypertensive heart disease without heart failure Assessment & Plan: Chronic, well controlled. She will continue with amlodipine 5mg , carvedilol 6.25mg  twice daily and valsartan 160mg  daily. She is reminded to follow low sodium diet.   Aortic atherosclerosis (HCC) Assessment & Plan: Chronic, LDL goal is less than 70.  She will continue with rosuvastatin 20mg  daily. Encouraged to follow  heart healthy lifestyle.    Encounter for Hemoccult screening -     POC Hemoccult Bld/Stl (1-Cd Office Dx)  Influenza vaccination declined  COVID-19 vaccination declined  Other orders -     Hydrocortisone Acetate; Place 1 suppository (25 mg total) rectally 2 (two) times daily as needed for hemorrhoids or anal itching.  Dispense: 12 suppository; Refill: 0    Return for keep next appt as scheduled .  Patient was given opportunity to ask questions. Patient verbalized understanding of the plan and was able to repeat key elements of the plan. All questions were answered to their satisfaction.    I, Gwynneth Aliment, MD, have reviewed all documentation for this visit. The documentation on 10/12/22 for the exam, diagnosis, procedures, and orders are all accurate and complete.   IF YOU HAVE BEEN REFERRED TO A SPECIALIST, IT MAY TAKE 1-2 WEEKS TO SCHEDULE/PROCESS THE REFERRAL. IF YOU HAVE NOT HEARD FROM US/SPECIALIST IN TWO WEEKS, PLEASE GIVE [place] A CALL AT [phone].

## 2022-10-12 NOTE — Assessment & Plan Note (Signed)
Chronic, well controlled. She will continue with amlodipine 5mg , carvedilol 6.25mg  twice daily and valsartan 160mg  daily. She is reminded to follow low sodium diet.

## 2022-10-12 NOTE — Assessment & Plan Note (Signed)
Chronic, LDL goal is less than 70.  She will continue with rosuvastatin 20mg  daily. Encouraged to follow heart healthy lifestyle.

## 2022-10-12 NOTE — Assessment & Plan Note (Signed)
Her SOB has improved, I will check CBC and iron studies today. I will make further recommendations once her labs are available for review.

## 2022-10-12 NOTE — Assessment & Plan Note (Signed)
I will send rx Anusol suppository for her to use tid prn.

## 2022-10-12 NOTE — Patient Instructions (Signed)
Hemorrhoids Hemorrhoids are swollen veins in and around the rectum or the opening of the butt (anus). There are two types of hemorrhoids: Internal. These occur in the veins just inside the rectum. They may poke through to the outside and become irritated and painful. External. These occur in the veins outside the anus. They can be felt as a painful swelling or hard lump near the anus. Most hemorrhoids do not cause severe problems. Often, they can be treated at home with diet and lifestyle changes. If home treatments do not help, you may need a procedure to shrink or remove the hemorrhoids. What are the causes? Hemorrhoids are caused by pressure near the anus. This pressure may be caused by: Constipation or diarrhea. Straining to poop. Pregnancy. Obesity. Sitting or riding a bike for a long time. Heavy lifting or other things that cause you to strain. Anal sex. What are the signs or symptoms? Symptoms of this condition include: Pain. Anal itching or irritation. Bleeding from the rectum. Leakage of poop (stool). Swelling of the anus. One or more lumps around the anus. How is this diagnosed? Hemorrhoids can often be diagnosed through a visual exam. Other exams or tests may also be done, such as: A digital rectal exam. This is when your health care provider feels inside your rectum with a gloved finger. Anoscope. This is an exam of the anus using a small tube. A blood test, if you have lost a lot of blood. A sigmoidoscopy or colonoscopy. These are tests to look inside the colon using a tube with a camera on the end. How is this treated? In most cases, hemorrhoids can be treated at home with diet and lifestyle changes. If these changes do not help, you may need to have a procedure done. These procedures can make the hemorrhoids smaller or fully remove them. Common procedures include: Rubber band ligation. Rubber bands are placed at the base of the hemorrhoids to cut off their blood  supply. Sclerotherapy. Medicine is put into the hemorrhoids to shrink them. Infrared coagulation. A type of light energy is used to get rid of the hemorrhoids. Hemorrhoidectomy surgery. The hemorrhoids are removed during surgery. Then, the veins that supply them are tied off. Stapled hemorrhoidopexy surgery. The base of the hemorrhoid is stapled to the wall of the rectum. Follow these instructions at home: Medicines Take over-the-counter and prescription medicines only as told by your provider. Use medicated creams or medicines that are put in the rectum (suppositories) as told by your provider. Eating and drinking  Eat foods that are high in fiber, such as beans, whole grains, and fresh fruits and vegetables. Ask your provider about taking products that have fiber added to them (fiber supplements). Reduce the amount of fat in your diet. You can do this by eating low-fat dairy products, eating less red meat, and avoiding processed foods. Drink enough fluid to keep your pee (urine) pale yellow. Managing pain and swelling  Take warm sitz baths for 20 minutes, 3-4 times a day. This can help ease pain and discomfort. You may do this in a bathtub or you can use a portable sitz bath that fits over the toilet. If told, put ice on the affected area. It may help to use ice packs between sitz baths. Put ice in a plastic bag. Place a towel between your skin and the bag. Leave the ice on for 20 minutes, 2-3 times a day. If your skin turns bright red, remove the ice right away to prevent  skin damage. The risk of damage is higher if you cannot feel pain, heat, or cold. General instructions Exercise. Ask your provider how much and what kind of exercise is best for you. In general, you should do moderate exercise for at least 30 minutes on most days of the week (150 minutes each week). You may want to try walking, biking, or yoga. Go to the bathroom when you have the urge to poop. Do not wait. Avoid  straining to poop. Keep the anus dry and clean. Use wet toilet paper or moist towelettes after you poop. Do not sit on the toilet for a long time. This can increase blood pooling and pain. Where to find more information [employer] Diabetes and Digestive [employer] Kidney Diseases: [employer] Contact a health care provider if: You have more pain and swelling that do not get better with treatment. You have trouble pooping or you are not able to poop. You have pain or inflammation outside the area of the hemorrhoids. Get help right away if: You are bleeding from your rectum and you cannot get it to stop. This information is not intended to replace advice given to you by your health care provider. Make sure you discuss any questions you have with your health care provider. Document Revised: 09/17/2021 Document Reviewed: 09/17/2021 Elsevier Patient Education  2024 [employer].

## 2022-10-13 LAB — CBC WITH DIFFERENTIAL/PLATELET
Basophils Absolute: 0 10*3/uL (ref 0.0–0.2)
Basos: 1 %
EOS (ABSOLUTE): 0.1 10*3/uL (ref 0.0–0.4)
Eos: 1 %
Hematocrit: 36.3 % (ref 34.0–46.6)
Hemoglobin: 10.7 g/dL — ABNORMAL LOW (ref 11.1–15.9)
Immature Grans (Abs): 0 10*3/uL (ref 0.0–0.1)
Immature Granulocytes: 0 %
Lymphocytes Absolute: 1.5 10*3/uL (ref 0.7–3.1)
Lymphs: 23 %
MCH: 26 pg — ABNORMAL LOW (ref 26.6–33.0)
MCHC: 29.5 g/dL — ABNORMAL LOW (ref 31.5–35.7)
MCV: 88 fL (ref 79–97)
Monocytes Absolute: 0.5 10*3/uL (ref 0.1–0.9)
Monocytes: 7 %
Neutrophils Absolute: 4.2 10*3/uL (ref 1.4–7.0)
Neutrophils: 68 %
Platelets: 246 10*3/uL (ref 150–450)
RBC: 4.12 x10E6/uL (ref 3.77–5.28)
RDW: 18 % — ABNORMAL HIGH (ref 11.7–15.4)
WBC: 6.3 10*3/uL (ref 3.4–10.8)

## 2022-10-13 LAB — IRON,TIBC AND FERRITIN PANEL
Ferritin: 32 ng/mL (ref 15–150)
Iron Saturation: 14 % — ABNORMAL LOW (ref 15–55)
Iron: 46 ug/dL (ref 27–139)
Total Iron Binding Capacity: 334 ug/dL (ref 250–450)
UIBC: 288 ug/dL (ref 118–369)

## 2022-10-14 ENCOUNTER — Other Ambulatory Visit: Payer: Self-pay

## 2022-10-14 MED FILL — Duloxetine HCl Enteric Coated Pellets Cap 30 MG (Base Eq): ORAL | 30 days supply | Qty: 30 | Fill #0 | Status: AC

## 2022-10-15 ENCOUNTER — Other Ambulatory Visit: Payer: Self-pay

## 2022-10-15 ENCOUNTER — Other Ambulatory Visit (HOSPITAL_COMMUNITY): Payer: Self-pay

## 2022-11-09 ENCOUNTER — Other Ambulatory Visit: Payer: Self-pay

## 2022-11-09 ENCOUNTER — Other Ambulatory Visit (HOSPITAL_COMMUNITY): Payer: Self-pay

## 2022-11-09 MED FILL — Duloxetine HCl Enteric Coated Pellets Cap 30 MG (Base Eq): ORAL | 30 days supply | Qty: 30 | Fill #1 | Status: AC

## 2022-11-10 ENCOUNTER — Other Ambulatory Visit: Payer: Self-pay | Admitting: Internal Medicine

## 2022-11-30 ENCOUNTER — Other Ambulatory Visit: Payer: Self-pay

## 2022-11-30 MED FILL — Duloxetine HCl Enteric Coated Pellets Cap 30 MG (Base Eq): ORAL | 30 days supply | Qty: 30 | Fill #2 | Status: CN

## 2022-12-02 ENCOUNTER — Other Ambulatory Visit: Payer: Self-pay

## 2022-12-02 MED FILL — Duloxetine HCl Enteric Coated Pellets Cap 30 MG (Base Eq): ORAL | 30 days supply | Qty: 30 | Fill #2 | Status: AC

## 2022-12-15 ENCOUNTER — Other Ambulatory Visit: Payer: Self-pay

## 2022-12-21 IMAGING — CT CTA CHEST W/ AND/OR W/O CM W/ OR W/O DISSECTION AND GATING
4 series · 8 of 16 positions shown, 9 images · IV contrast (APPLIED)
Comparison: Cardiac CTA concurrently obtained, interpreted
separately by Cardiology
COMPARISON: Cardiac CTA concurrently obtained, interpreted
separately by Cardiology

Addendum:
CLINICAL DATA: Chest pain

EXAM:
CT ANGIOGRAPHY CHEST WITH CONTRAST
TECHNIQUE: Multidetector CT imaging of the chest was performed using the
standard protocol during bolus administration of intravenous
contrast. Multiplanar CT image reconstructions and MIPs were
obtained to evaluate the vascular anatomy.
CLINICAL DATA: 65 Year old African American Female
Cardiac/Coronary  CTA
TECHNIQUE: The patient was scanned on a [employer] Force scanner.

[Series 6: ax chest 72 % · axial · 0.57mm/px · z∈[+1231,+1481]mm · 3 of 126 slices shown, 4 images]
[im 1/126  soft-tissue]
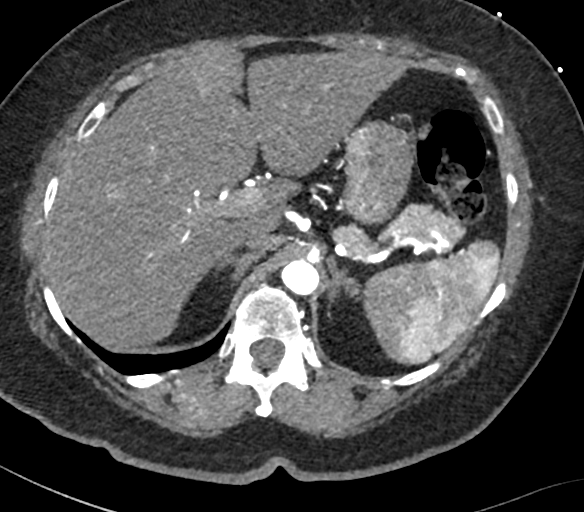
[im 1/126  bone]
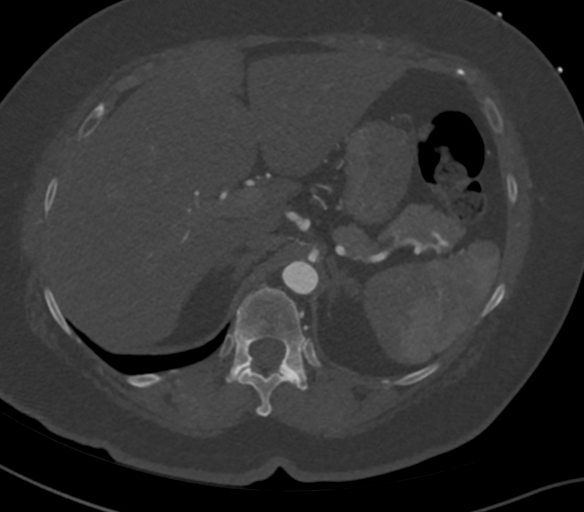
[im 63/126  soft-tissue]
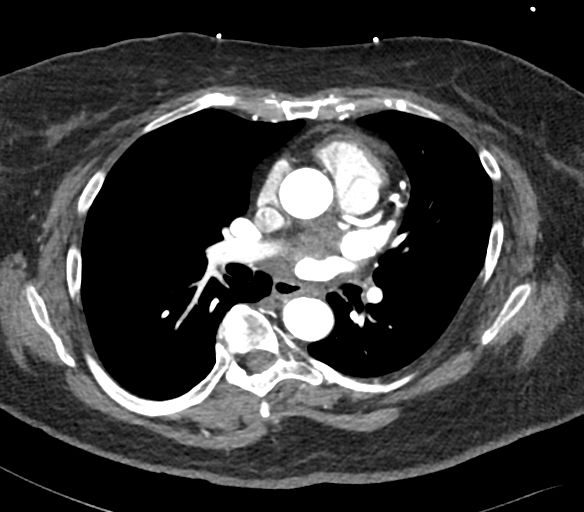
[im 126/126  soft-tissue]
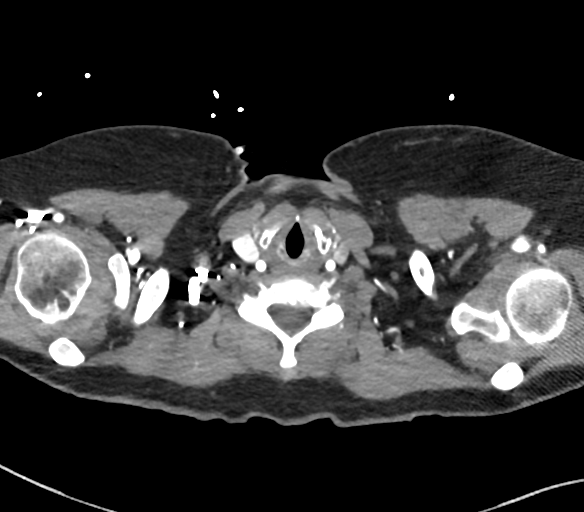

[Series 7: ax lung 72 % · axial · 0.53mm/px · z∈[+1311,+1395]mm · 2 of 127 slices shown]
[im 43/127  bone]
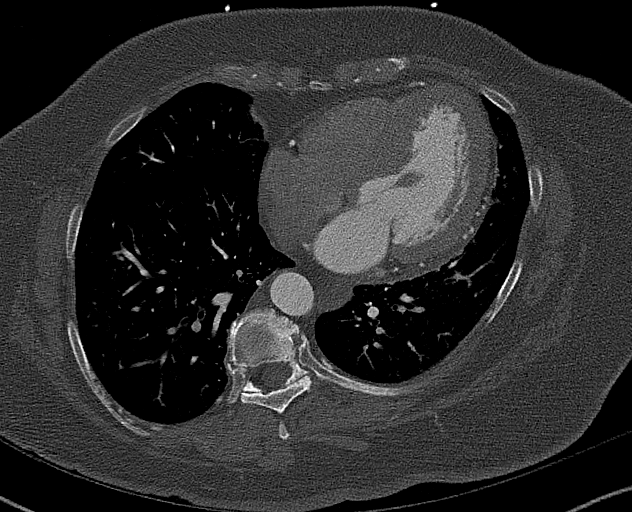
[im 85/127  bone]
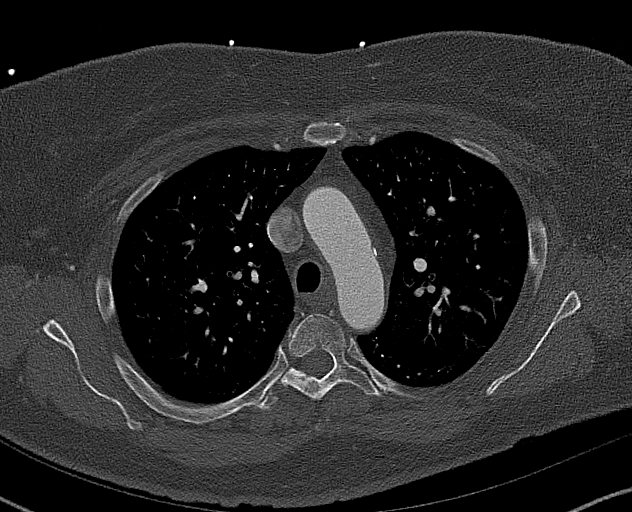

[Series 602: cor chest · coronal · 0.57mm/px · 2 of 140 slices shown]
[im 47/140  soft-tissue]
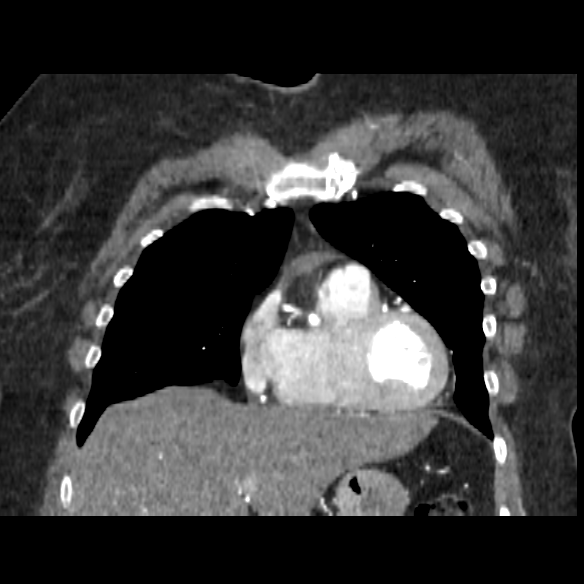
[im 93/140  soft-tissue]
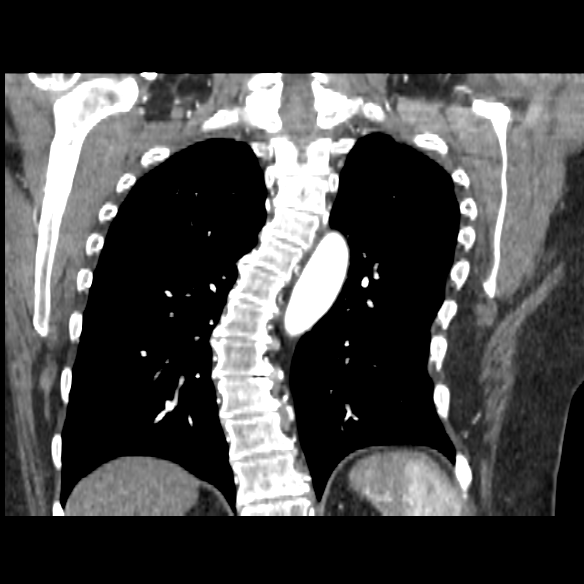

[Series 603: sag chest · sagittal · 0.57mm/px · 1 of 172 slices shown]
[im 86/172  soft-tissue]
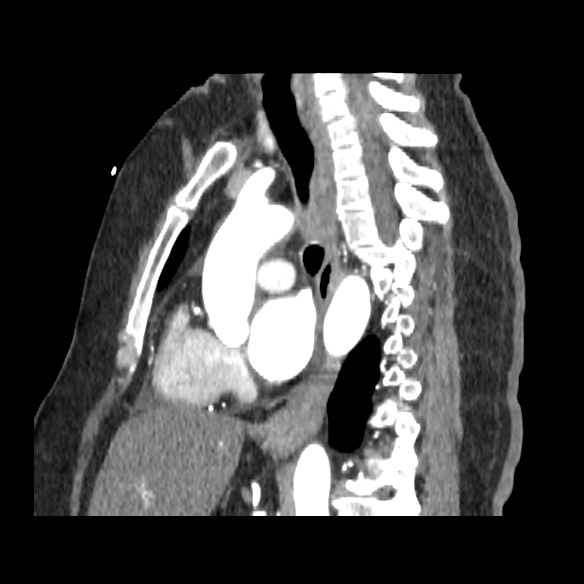

[8 of 16 positions shown; findings below may reference images not displayed]

RADIATION DOSE REDUCTION: This exam was performed according to the
departmental dose-optimization program which includes automated
exposure control, adjustment of the mA and/or kV according to
patient size and/or use of iterative reconstruction technique.

CONTRAST:  100mL OMNIPAQUE IOHEXOL 350 MG/ML SOLN
FINDINGS: Cardiovascular: Heart size normal. Small volume pericardial fluid.
Satisfactory opacification of pulmonary arteries noted, and there is
no evidence of pulmonary emboli. Adequate contrast opacification of
the thoracic aorta with no evidence of dissection, aneurysm, or
stenosis. There is bovine variant brachiocephalic arch anatomy
without proximal stenosis. Minimal calcified plaque in the distal
arch, and visualized proximal abdominal aorta.

Mediastinum/Nodes: Small hiatal hernia. No hilar or mediastinal
adenopathy.

Lungs/Pleura: No pleural effusion.  Lungs are clear.

Upper Abdomen: Small hiatal hernia.  No acute findings.

Musculoskeletal: Thoracic dextroscoliosis apex T8 without underlying
vertebral anomaly. vertebral endplate spurring at multiple levels in
the mid thoracic spine. Mild right shoulder DJD.

Review of the MIP images confirms the above findings.
IMPRESSION: 1. No acute findings.
2. Mild Aortic Atherosclerosis (NQB7U-170.0).
3. Small hiatal hernia
FINDINGS: Scan was triggered in the descending thoracic aorta. Axial
non-contrast 3 mm slices were carried out through the heart. The
data set was analyzed on a dedicated work station and scored using
the Agatson method. Gantry rotation speed was 250 msecs and
collimation was .6 mm. 0.8 mg of sl NTG was given. The 3D data set
was reconstructed in 5% intervals of the 67-82 % of the R-R cycle.
Diastolic phases were analyzed on a dedicated work station using
MPR, MIP and VRT modes. The patient received 100 cc of contrast.

Aorta:  Normal size.  No calcifications.  No dissection.

Main Pulmonary Artery: Normal size of the pulmonary artery.

Aortic Valve:  Tri-leaflet.  No calcifications.

Coronary Arteries:  Normal coronary origin.  Right dominance.

Coronary Calcium Score:

Left main: 0

Left anterior descending artery: 33

Left circumflex artery: 0

Right coronary artery: 41

Total: 74

Percentile: 80th for age, sex, and race matched control.

RCA is a large right dominant artery that gives rise to PDA and PLA.
Mild calcified plaques in the proximal vessel. Mild mixed plaque
distal vessel.

Left main is a large artery that gives rise to LAD and LCX arteries.
There is no significant plaque.

LAD is a large vessel that gives rise to one two diagonal branches.
Mild soft plaque mid LAD. Mild mixed plaque body of D1

LCX is a non-dominant artery that gives rise to one large OM1
branch. There is a moderate luminal narrowing in the body the mid
circumflex

Other findings:

Normal pulmonary vein drainage into the left atrium.

Normal left atrial appendage without a thrombus.

Small PFO.

Extra-cardiac findings: See attached radiology report for
non-cardiac structures.

Stair-step artifact notably in the RCA and LAD. This is from patient
motion artifact.
IMPRESSION: 1. Coronary calcium score of 74. This was 80th percentile for age,
sex, and race matched control.

2. Normal coronary origin with right dominance.

3. CAD-RADS 3. Moderate stenosis. Consider symptom-guided
anti-ischemic pharmacotherapy as well as risk factor modification
per guideline directed care. Additional analysis with CT FFR will be
submitted.

RECOMMENDATIONS:



If CAC = 0, it is reasonable to withhold statin therapy and reassess
in 5 to 10 years, as long as higher risk conditions are absent
(diabetes mellitus, family history of premature CHD in first degree
relatives (males <55 years; females <65 years), cigarette smoking,
LDL >=190 mg/dL or other independent risk factors).

If CAC is 1 to 99, it is reasonable to initiate statin therapy for
patients >=55 years of age.

If CAC is >=100 or >=75th percentile, it is reasonable to initiate
statin therapy at any age.

Cardiology referral should be considered for patients with CAC
scores =400 or >=75th percentile.

*6413 AHA/ACC/AACVPR/AAPA/ABC/AMKREUTZ/MATUTE/LAMPORT/Lammers/JUMPER/HLFA/LIYAM
Guideline on the Management of Blood Cholesterol: A Report of the
American College of Cardiology/American Heart Association Task Force
on Clinical Practice Guidelines. J Am Coll Cardiol.
9802;73(24):5788-5506.

*** End of Addendum ***
RADIATION DOSE REDUCTION: This exam was performed according to the
departmental dose-optimization program which includes automated
exposure control, adjustment of the mA and/or kV according to
patient size and/or use of iterative reconstruction technique.

CONTRAST:  100mL OMNIPAQUE IOHEXOL 350 MG/ML SOLN
FINDINGS: Cardiovascular: Heart size normal. Small volume pericardial fluid.
Satisfactory opacification of pulmonary arteries noted, and there is
no evidence of pulmonary emboli. Adequate contrast opacification of
the thoracic aorta with no evidence of dissection, aneurysm, or
stenosis. There is bovine variant brachiocephalic arch anatomy
without proximal stenosis. Minimal calcified plaque in the distal
arch, and visualized proximal abdominal aorta.

Mediastinum/Nodes: Small hiatal hernia. No hilar or mediastinal
adenopathy.

Lungs/Pleura: No pleural effusion.  Lungs are clear.

Upper Abdomen: Small hiatal hernia.  No acute findings.

Musculoskeletal: Thoracic dextroscoliosis apex T8 without underlying
vertebral anomaly. vertebral endplate spurring at multiple levels in
the mid thoracic spine. Mild right shoulder DJD.

Review of the MIP images confirms the above findings.
IMPRESSION: 1. No acute findings.
2. Mild Aortic Atherosclerosis (NQB7U-170.0).
3. Small hiatal hernia

## 2022-12-21 IMAGING — CT CT HEART MORP W/ CTA COR W/ SCORE W/ CA W/CM &/OR W/O CM
1 of 8 series · 2 of 20 positions shown, 3 images · IV contrast (APPLIED)
Comparison: Cardiac CTA concurrently obtained, interpreted
separately by Cardiology
COMPARISON: Cardiac CTA concurrently obtained, interpreted
separately by Cardiology

Addendum:
CLINICAL DATA: Chest pain

EXAM:
CT ANGIOGRAPHY CHEST WITH CONTRAST
TECHNIQUE: Multidetector CT imaging of the chest was performed using the
standard protocol during bolus administration of intravenous
contrast. Multiplanar CT image reconstructions and MIPs were
obtained to evaluate the vascular anatomy.
CLINICAL DATA: 65 Year old African American Female
Cardiac/Coronary  CTA
TECHNIQUE: The patient was scanned on a [employer] Force scanner.

[Series 6: best diast · axial · 0.39mm/px · z∈[+1304,+1339]mm · 2 of 266 slices shown, 3 images]
[im 89/266  vessel]
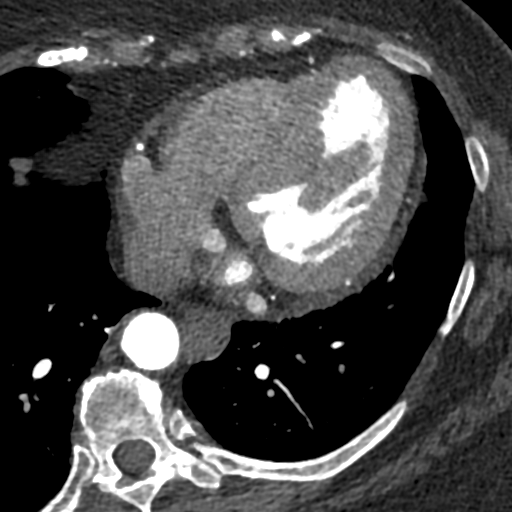
[im 89/266  lung]
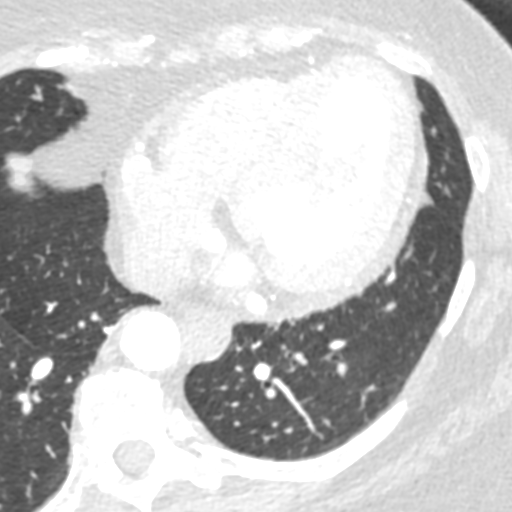
[im 177/266  vessel]
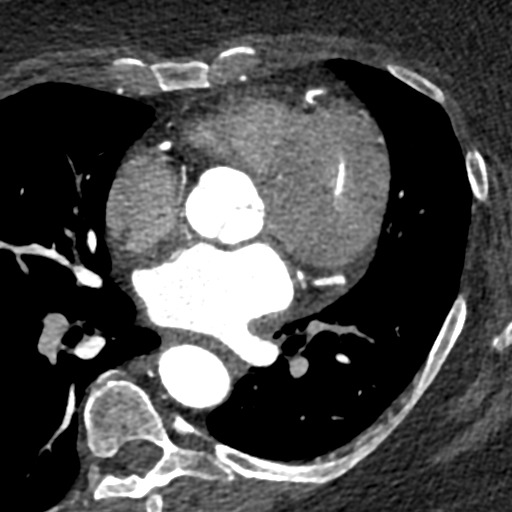

[2 of 20 positions shown; findings below may reference images not displayed]

RADIATION DOSE REDUCTION: This exam was performed according to the
departmental dose-optimization program which includes automated
exposure control, adjustment of the mA and/or kV according to
patient size and/or use of iterative reconstruction technique.

CONTRAST:  100mL OMNIPAQUE IOHEXOL 350 MG/ML SOLN
FINDINGS: Cardiovascular: Heart size normal. Small volume pericardial fluid.
Satisfactory opacification of pulmonary arteries noted, and there is
no evidence of pulmonary emboli. Adequate contrast opacification of
the thoracic aorta with no evidence of dissection, aneurysm, or
stenosis. There is bovine variant brachiocephalic arch anatomy
without proximal stenosis. Minimal calcified plaque in the distal
arch, and visualized proximal abdominal aorta.

Mediastinum/Nodes: Small hiatal hernia. No hilar or mediastinal
adenopathy.

Lungs/Pleura: No pleural effusion.  Lungs are clear.

Upper Abdomen: Small hiatal hernia.  No acute findings.

Musculoskeletal: Thoracic dextroscoliosis apex T8 without underlying
vertebral anomaly. vertebral endplate spurring at multiple levels in
the mid thoracic spine. Mild right shoulder DJD.

Review of the MIP images confirms the above findings.
IMPRESSION: 1. No acute findings.
2. Mild Aortic Atherosclerosis (NQB7U-170.0).
3. Small hiatal hernia
FINDINGS: Scan was triggered in the descending thoracic aorta. Axial
non-contrast 3 mm slices were carried out through the heart. The
data set was analyzed on a dedicated work station and scored using
the Agatson method. Gantry rotation speed was 250 msecs and
collimation was .6 mm. 0.8 mg of sl NTG was given. The 3D data set
was reconstructed in 5% intervals of the 67-82 % of the R-R cycle.
Diastolic phases were analyzed on a dedicated work station using
MPR, MIP and VRT modes. The patient received 100 cc of contrast.

Aorta:  Normal size.  No calcifications.  No dissection.

Main Pulmonary Artery: Normal size of the pulmonary artery.

Aortic Valve:  Tri-leaflet.  No calcifications.

Coronary Arteries:  Normal coronary origin.  Right dominance.

Coronary Calcium Score:

Left main: 0

Left anterior descending artery: 33

Left circumflex artery: 0

Right coronary artery: 41

Total: 74

Percentile: 80th for age, sex, and race matched control.

RCA is a large right dominant artery that gives rise to PDA and PLA.
Mild calcified plaques in the proximal vessel. Mild mixed plaque
distal vessel.

Left main is a large artery that gives rise to LAD and LCX arteries.
There is no significant plaque.

LAD is a large vessel that gives rise to one two diagonal branches.
Mild soft plaque mid LAD. Mild mixed plaque body of D1

LCX is a non-dominant artery that gives rise to one large OM1
branch. There is a moderate luminal narrowing in the body the mid
circumflex

Other findings:

Normal pulmonary vein drainage into the left atrium.

Normal left atrial appendage without a thrombus.

Small PFO.

Extra-cardiac findings: See attached radiology report for
non-cardiac structures.

Stair-step artifact notably in the RCA and LAD. This is from patient
motion artifact.
IMPRESSION: 1. Coronary calcium score of 74. This was 80th percentile for age,
sex, and race matched control.

2. Normal coronary origin with right dominance.

3. CAD-RADS 3. Moderate stenosis. Consider symptom-guided
anti-ischemic pharmacotherapy as well as risk factor modification
per guideline directed care. Additional analysis with CT FFR will be
submitted.

RECOMMENDATIONS:



If CAC = 0, it is reasonable to withhold statin therapy and reassess
in 5 to 10 years, as long as higher risk conditions are absent
(diabetes mellitus, family history of premature CHD in first degree
relatives (males <55 years; females <65 years), cigarette smoking,
LDL >=190 mg/dL or other independent risk factors).

If CAC is 1 to 99, it is reasonable to initiate statin therapy for
patients >=55 years of age.

If CAC is >=100 or >=75th percentile, it is reasonable to initiate
statin therapy at any age.

Cardiology referral should be considered for patients with CAC
scores =400 or >=75th percentile.

*6413 AHA/ACC/AACVPR/AAPA/ABC/AMKREUTZ/MATUTE/LAMPORT/Lammers/JUMPER/HLFA/LIYAM
Guideline on the Management of Blood Cholesterol: A Report of the
American College of Cardiology/American Heart Association Task Force
on Clinical Practice Guidelines. J Am Coll Cardiol.
9802;73(24):5788-5506.

*** End of Addendum ***
RADIATION DOSE REDUCTION: This exam was performed according to the
departmental dose-optimization program which includes automated
exposure control, adjustment of the mA and/or kV according to
patient size and/or use of iterative reconstruction technique.

CONTRAST:  100mL OMNIPAQUE IOHEXOL 350 MG/ML SOLN
FINDINGS: Cardiovascular: Heart size normal. Small volume pericardial fluid.
Satisfactory opacification of pulmonary arteries noted, and there is
no evidence of pulmonary emboli. Adequate contrast opacification of
the thoracic aorta with no evidence of dissection, aneurysm, or
stenosis. There is bovine variant brachiocephalic arch anatomy
without proximal stenosis. Minimal calcified plaque in the distal
arch, and visualized proximal abdominal aorta.

Mediastinum/Nodes: Small hiatal hernia. No hilar or mediastinal
adenopathy.

Lungs/Pleura: No pleural effusion.  Lungs are clear.

Upper Abdomen: Small hiatal hernia.  No acute findings.

Musculoskeletal: Thoracic dextroscoliosis apex T8 without underlying
vertebral anomaly. vertebral endplate spurring at multiple levels in
the mid thoracic spine. Mild right shoulder DJD.

Review of the MIP images confirms the above findings.
IMPRESSION: 1. No acute findings.
2. Mild Aortic Atherosclerosis (NQB7U-170.0).
3. Small hiatal hernia

## 2022-12-22 ENCOUNTER — Ambulatory Visit: Payer: Medicare PPO | Admitting: Internal Medicine

## 2022-12-22 ENCOUNTER — Encounter: Payer: Self-pay | Admitting: Internal Medicine

## 2022-12-22 VITALS — BP 122/80 | HR 81 | Temp 98.0°F | Ht 61.0 in | Wt 175.0 lb

## 2022-12-22 DIAGNOSIS — K649 Unspecified hemorrhoids: Secondary | ICD-10-CM

## 2022-12-22 DIAGNOSIS — Z0000 Encounter for general adult medical examination without abnormal findings: Secondary | ICD-10-CM | POA: Diagnosis not present

## 2022-12-22 DIAGNOSIS — D508 Other iron deficiency anemias: Secondary | ICD-10-CM

## 2022-12-22 DIAGNOSIS — I700 Atherosclerosis of aorta: Secondary | ICD-10-CM | POA: Diagnosis not present

## 2022-12-22 DIAGNOSIS — E66811 Obesity, class 1: Secondary | ICD-10-CM | POA: Diagnosis not present

## 2022-12-22 DIAGNOSIS — E6609 Other obesity due to excess calories: Secondary | ICD-10-CM

## 2022-12-22 DIAGNOSIS — E1169 Type 2 diabetes mellitus with other specified complication: Secondary | ICD-10-CM | POA: Diagnosis not present

## 2022-12-22 DIAGNOSIS — I119 Hypertensive heart disease without heart failure: Secondary | ICD-10-CM | POA: Diagnosis not present

## 2022-12-22 DIAGNOSIS — E785 Hyperlipidemia, unspecified: Secondary | ICD-10-CM | POA: Diagnosis not present

## 2022-12-22 DIAGNOSIS — Z6833 Body mass index (BMI) 33.0-33.9, adult: Secondary | ICD-10-CM

## 2022-12-22 LAB — POC HEMOCCULT BLD/STL (OFFICE/1-CARD/DIAGNOSTIC): Fecal Occult Blood, POC: NEGATIVE

## 2022-12-22 NOTE — Patient Instructions (Signed)

## 2022-12-22 NOTE — Assessment & Plan Note (Signed)
 She is encouraged to strive for BMI less than 30 to decrease cardiac risk. Advised to aim for at least 150 minutes of exercise per week.

## 2022-12-22 NOTE — Progress Notes (Signed)
I,Victoria T Deloria Lair, CMA,acting as a Neurosurgeon for Gwynneth Aliment, MD.,have documented all relevant documentation on the behalf of Gwynneth Aliment, MD,as directed by  Gwynneth Aliment, MD while in the presence of Gwynneth Aliment, MD.  Subjective:    Patient ID: Whitney Sanchez , female    DOB: Jan 22, 1955 , 67 y.o.   MRN: [id number]  Chief Complaint  Patient presents with   Annual Exam   Hypertension   Diabetes    HPI  Patient presents today for annual exam.  She is no longer followed by GYN. Patient reports compliance with medications and has no other issues. She denies having any headaches, chest pain and shortness of breath. She experiences pain in her right knee. She admits more so both knees.  She also would like a referral for her hemorrhoid discomfort. She does not want them removed, she would like something for the pain.   Hypertension This is a chronic problem. The current episode started more than 1 year ago. The problem has been gradually improving since onset. The problem is uncontrolled. Pertinent negatives include no blurred vision or chest pain. Risk factors for coronary artery disease include dyslipidemia and diabetes mellitus. The current treatment provides moderate improvement.  Diabetes She presents for her follow-up diabetic visit. She has type 2 diabetes mellitus. Pertinent negatives for diabetes include no blurred vision and no chest pain. There are no hypoglycemic complications. Risk factors for coronary artery disease include diabetes mellitus, dyslipidemia, hypertension, sedentary lifestyle, post-menopausal and obesity. An ACE inhibitor/angiotensin II receptor blocker is being taken.     Past Medical History:  Diagnosis Date   DM (diabetes mellitus) (HCC)    High cholesterol    HTN (hypertension)      Family History  Problem Relation Age of Onset   Diabetes Mother    Healthy Father    Breast cancer Neg Hx    Neuropathy Neg Hx      Current Outpatient  Medications:    amLODipine (NORVASC) 5 MG tablet, Take 1 tablet (5 mg total) by mouth at bedtime., Disp: 90 tablet, Rfl: 1   aspirin EC 81 MG tablet, Take 1 tablet (81 mg total) by mouth daily. Swallow whole., Disp: 90 tablet, Rfl: 1   blood glucose meter kit and supplies KIT, Dispense based on patient and insurance preference. Use up to four times daily as directed. Dx code e11.65, Disp: 1 each, Rfl: 4   carvedilol (COREG) 6.25 MG tablet, Take 1 tablet (6.25 mg total) by mouth 2 (two) times daily with a meal., Disp: 180 tablet, Rfl: 3   diclofenac (VOLTAREN) 75 MG EC tablet, Take 1 tablet (75 mg total) by mouth 2 (two) times daily., Disp: 30 tablet, Rfl: 2   DULoxetine (CYMBALTA) 30 MG capsule, Take 1 capsule (30 mg total) by mouth daily., Disp: 90 capsule, Rfl: 1   hydrocortisone (ANUSOL-HC) 25 MG suppository, PLACE 1 SUPPOSITORY RECTALLY 2 (TWO) TIMES DAILY AS NEEDED FOR HEMORRHOIDS OR ANAL ITCHING., Disp: 12 suppository, Rfl: 0   isosorbide mononitrate (IMDUR) 30 MG 24 hr tablet, Take 1 tablet (30 mg total) by mouth daily., Disp: 90 tablet, Rfl: 3   nitroGLYCERIN (NITROSTAT) 0.4 MG SL tablet, Place 1 tablet (0.4 mg total) under the tongue every 5 (five) minutes x 3 doses as needed for chest pain. If no relief after 3 doses, call 911., Disp: 25 tablet, Rfl: 3   rosuvastatin (CRESTOR) 20 MG tablet, Take 1 tablet (20 mg total) by mouth daily.,  Disp: 90 tablet, Rfl: 3   Semaglutide, 1 MG/DOSE, (OZEMPIC, 1 MG/DOSE,) 4 MG/3ML SOPN, DIAL AND INJECT UNDER THE SKIN 1 MG WEEKLY (Patient taking differently: Inject 1 mg into the skin once a week.), Disp: 6 mL, Rfl: 2   valsartan (DIOVAN) 160 MG tablet, Take 1 tablet (160 mg total) by mouth daily., Disp: 90 tablet, Rfl: 2   No Known Allergies    The patient states she uses status post hysterectomy for birth control. No LMP recorded. Patient has had a hysterectomy.. Negative for Dysmenorrhea. Negative for: breast discharge, breast lump(s), breast pain and  breast self exam. Associated symptoms include abnormal vaginal bleeding. Pertinent negatives include abnormal bleeding (hematology), anxiety, decreased libido, depression, difficulty falling sleep, dyspareunia, history of infertility, nocturia, sexual dysfunction, sleep disturbances, urinary incontinence, urinary urgency, vaginal discharge and vaginal itching. Diet regular.The patient states her exercise level is  intermittent.  . The patient's tobacco use is:  Social History   Tobacco Use  Smoking Status Never   Passive exposure: Never  Smokeless Tobacco Never  . She has been exposed to passive smoke. The patient's alcohol use is:  Social History   Substance and Sexual Activity  Alcohol Use Never   Review of Systems  Constitutional: Negative.   HENT: Negative.    Eyes: Negative.  Negative for blurred vision.  Respiratory: Negative.    Cardiovascular: Negative.  Negative for chest pain.  Gastrointestinal: Negative.   Endocrine: Negative.   Genitourinary: Negative.   Musculoskeletal: Negative.   Skin: Negative.   Allergic/Immunologic: Negative.   Neurological: Negative.   Hematological: Negative.   Psychiatric/Behavioral: Negative.       Today's Vitals   12/22/22 0943  BP: 122/80  Pulse: 81  Temp: 98 F (36.7 C)  SpO2: 98%  Weight: 175 lb (79.4 kg)  Height: 5\' 1"  (1.549 m)   Body mass index is 33.07 kg/m.  Wt Readings from Last 3 Encounters:  12/22/22 175 lb (79.4 kg)  10/12/22 171 lb (77.6 kg)  09/03/22 173 lb 9.6 oz (78.7 kg)     Objective:  Physical Exam Vitals and nursing note reviewed. Exam conducted with a chaperone present.  Constitutional:      Appearance: Normal appearance.  HENT:     Head: Normocephalic and atraumatic.     Right Ear: Tympanic membrane, ear canal and external ear normal.     Left Ear: Tympanic membrane, ear canal and external ear normal.     Nose: Nose normal.     Mouth/Throat:     Mouth: Mucous membranes are moist.     Pharynx:  Oropharynx is clear.  Eyes:     Extraocular Movements: Extraocular movements intact.     Conjunctiva/sclera: Conjunctivae normal.     Pupils: Pupils are equal, round, and reactive to light.  Cardiovascular:     Rate and Rhythm: Normal rate and regular rhythm.     Pulses: Normal pulses.     Heart sounds: Normal heart sounds.  Pulmonary:     Effort: Pulmonary effort is normal.     Breath sounds: Normal breath sounds.  Chest:  Breasts:    Tanner Score is 5.     Right: Normal.     Left: Normal.  Abdominal:     General: Abdomen is flat. Bowel sounds are normal.     Palpations: Abdomen is soft.  Genitourinary:    Rectum: Guaiac result negative. External hemorrhoid present.     Comments: Rectal exam performed, scant stool in vault. Hemoccult neg.  Ext hemorrhoid noted. Musculoskeletal:        General: Normal range of motion.     Cervical back: Normal range of motion and neck supple.  Skin:    General: Skin is warm and dry.  Neurological:     General: No focal deficit present.     Mental Status: She is alert and oriented to person, place, and time.  Psychiatric:        Mood and Affect: Mood normal.        Behavior: Behavior normal.         Assessment And Plan:     Annual physical exam Assessment & Plan: A full exam was performed.  Importance of monthly self breast exams was discussed with the patient.  She is advised to get 30-45 minutes of regular exercise, no less than four to five days per week. Both weight-bearing and aerobic exercises are recommended.  She is advised to follow a healthy diet with at least six fruits/veggies per day, decrease intake of red meat and other saturated fats and to increase fish intake to twice weekly.  Meats/fish should not be fried -- baked, boiled or broiled is preferable. It is also important to cut back on your sugar intake.  Be sure to read labels - try to avoid anything with added sugar, high fructose corn syrup or other sweeteners.  If you must  use a sweetener, you can try stevia or monkfruit.  It is also important to avoid artificially sweetened foods/beverages and diet drinks. Lastly, wear SPF 50 sunscreen on exposed skin and when in direct sunlight for an extended period of time.  Be sure to avoid fast food restaurants and aim for at least 60 ounces of water daily.      Orders: -     POC Hemoccult Bld/Stl (1-Cd Office Dx)  Hypertensive heart disease without heart failure Assessment & Plan: Chronic, controlled.  EKG performed, NSR w/o acute changes. She is aware goal BP<120/80.  She will continue with amlodipine 5mg  daily, valsartan 160mg  daily and carvedilol 6.25mg  twice daily. She is encouraged to follow low sodium diet.   Orders: -     BMP8+EGFR -     EKG 12-Lead  Aortic atherosclerosis (HCC) Assessment & Plan: Chronic, LDL goal is less than 70.  She will continue with rosuvastatin 20mg  daily. Encouraged to follow heart healthy lifestyle.    Dyslipidemia associated with type 2 diabetes mellitus (HCC) Assessment & Plan: Chronic, diabetic foot exam was performed.  She is currently on weekly semaglutide 1mg  weekly. Will consider use of SGLT2i in the future. Importance of dietary/medication compliance was discussed with the patient. She will rto in 4 months for re-evaluation. Encouraged to also schedule eye exam. I DISCUSSED WITH THE PATIENT AT LENGTH REGARDING THE GOALS OF GLYCEMIC CONTROL AND POSSIBLE LONG-TERM COMPLICATIONS.  I  ALSO STRESSED THE IMPORTANCE OF COMPLIANCE WITH HOME GLUCOSE MONITORING, DIETARY RESTRICTIONS INCLUDING AVOIDANCE OF SUGARY DRINKS/PROCESSED FOODS,  ALONG WITH REGULAR EXERCISE.  I  ALSO STRESSED THE IMPORTANCE OF ANNUAL EYE EXAMS, SELF FOOT CARE AND COMPLIANCE WITH OFFICE VISITS.   Orders: -     Hemoglobin A1c -     Microalbumin / creatinine urine ratio -     BMP8+EGFR  Other iron deficiency anemia Assessment & Plan: I will check repeat CBC and iron studies today. She is feeling better with iron  supplementation, she reports compliance.   Orders: -     CBC -     Iron, TIBC and  Ferritin Panel  Hemorrhoids, unspecified hemorrhoid type Assessment & Plan: She is now interested in banding. I will refer her to GI as requested. Last colonoscopy was in 2016, pt advised she is likely due for repeat study since she has h/o colon polyps.   Orders: -     Ambulatory referral to Gastroenterology  Class 1 obesity due to excess calories with serious comorbidity and body mass index (BMI) of 33.0 to 33.9 in adult Assessment & Plan: She is encouraged to strive for BMI less than 30 to decrease cardiac risk. Advised to aim for at least 150 minutes of exercise per week.     She is encouraged to strive for BMI less than 30 to decrease cardiac risk. Advised to aim for at least 150 minutes of exercise per week.    Return for 1 year HM, 4 month bp & dm . Patient was given opportunity to ask questions. Patient verbalized understanding of the plan and was able to repeat key elements of the plan. All questions were answered to their satisfaction.    I, Gwynneth Aliment, MD, have reviewed all documentation for this visit. The documentation on 12/22/22 for the exam, diagnosis, procedures, and orders are all accurate and complete.

## 2022-12-22 NOTE — Assessment & Plan Note (Signed)

## 2022-12-22 NOTE — Assessment & Plan Note (Signed)
Chronic, LDL goal is less than 70.  She will continue with rosuvastatin 20mg  daily. Encouraged to follow heart healthy lifestyle.

## 2022-12-22 NOTE — Assessment & Plan Note (Signed)
Chronic, controlled.  EKG performed, NSR w/o acute changes. She is aware goal BP<120/80.  She will continue with amlodipine 5mg  daily, valsartan 160mg  daily and carvedilol 6.25mg  twice daily. She is encouraged to follow low sodium diet.

## 2022-12-22 NOTE — Assessment & Plan Note (Signed)
I will check repeat CBC and iron studies today. She is feeling better with iron supplementation, she reports compliance.

## 2022-12-22 NOTE — Assessment & Plan Note (Signed)
She is now interested in banding. I will refer her to GI as requested. Last colonoscopy was in 2016, pt advised she is likely due for repeat study since she has h/o colon polyps.

## 2022-12-22 NOTE — Assessment & Plan Note (Signed)
Chronic, diabetic foot exam was performed.  She is currently on weekly semaglutide 1mg  weekly. Will consider use of SGLT2i in the future. Importance of dietary/medication compliance was discussed with the patient. She will rto in 4 months for re-evaluation. Encouraged to also schedule eye exam. I DISCUSSED WITH THE PATIENT AT LENGTH REGARDING THE GOALS OF GLYCEMIC CONTROL AND POSSIBLE LONG-TERM COMPLICATIONS.  I  ALSO STRESSED THE IMPORTANCE OF COMPLIANCE WITH HOME GLUCOSE MONITORING, DIETARY RESTRICTIONS INCLUDING AVOIDANCE OF SUGARY DRINKS/PROCESSED FOODS,  ALONG WITH REGULAR EXERCISE.  I  ALSO STRESSED THE IMPORTANCE OF ANNUAL EYE EXAMS, SELF FOOT CARE AND COMPLIANCE WITH OFFICE VISITS.

## 2022-12-23 LAB — CBC
Hematocrit: 38.9 % (ref 34.0–46.6)
Hemoglobin: 12.2 g/dL (ref 11.1–15.9)
MCH: 28.4 pg (ref 26.6–33.0)
MCHC: 31.4 g/dL — ABNORMAL LOW (ref 31.5–35.7)
MCV: 91 fL (ref 79–97)
Platelets: 266 10*3/uL (ref 150–450)
RBC: 4.29 x10E6/uL (ref 3.77–5.28)
RDW: 14.4 % (ref 11.7–15.4)
WBC: 7.2 10*3/uL (ref 3.4–10.8)

## 2022-12-23 LAB — BMP8+EGFR
BUN/Creatinine Ratio: 19 (ref 12–28)
BUN: 14 mg/dL (ref 8–27)
CO2: 24 mmol/L (ref 20–29)
Calcium: 9.8 mg/dL (ref 8.7–10.3)
Chloride: 102 mmol/L (ref 96–106)
Creatinine, Ser: 0.72 mg/dL (ref 0.57–1.00)
Glucose: 103 mg/dL — ABNORMAL HIGH (ref 70–99)
Potassium: 4.3 mmol/L (ref 3.5–5.2)
Sodium: 139 mmol/L (ref 134–144)
eGFR: 90 mL/min/{1.73_m2} (ref >59)

## 2022-12-23 LAB — MICROALBUMIN / CREATININE URINE RATIO
Creatinine, Urine: 189 mg/dL
Microalb/Creat Ratio: 15 mg/g{creat} (ref 0–29)
Microalbumin, Urine: 28 ug/mL

## 2022-12-23 LAB — IRON,TIBC AND FERRITIN PANEL
Ferritin: 45 ng/mL (ref 15–150)
Iron Saturation: 19 % (ref 15–55)
Iron: 56 ug/dL (ref 27–139)
Total Iron Binding Capacity: 300 ug/dL (ref 250–450)
UIBC: 244 ug/dL (ref 118–369)

## 2022-12-23 LAB — HEMOGLOBIN A1C
Est. average glucose Bld gHb Est-mCnc: 117 mg/dL
Hgb A1c MFr Bld: 5.7 % — ABNORMAL HIGH (ref 4.8–5.6)

## 2022-12-30 ENCOUNTER — Other Ambulatory Visit: Payer: Self-pay | Admitting: Internal Medicine

## 2022-12-30 ENCOUNTER — Other Ambulatory Visit: Payer: Self-pay

## 2022-12-30 MED FILL — Duloxetine HCl Enteric Coated Pellets Cap 30 MG (Base Eq): ORAL | 30 days supply | Qty: 30 | Fill #3 | Status: CN

## 2022-12-31 ENCOUNTER — Other Ambulatory Visit: Payer: Self-pay

## 2022-12-31 ENCOUNTER — Other Ambulatory Visit (HOSPITAL_COMMUNITY): Payer: Self-pay

## 2022-12-31 MED ORDER — AMLODIPINE BESYLATE 5 MG PO TABS
5.0000 mg | ORAL_TABLET | Freq: Every evening | ORAL | 1 refills | Status: DC
  Filled 2022-12-31: qty 30, 30d supply, fill #0
  Filled 2023-01-18 – 2023-01-26 (×2): qty 30, 30d supply, fill #1
  Filled 2023-02-15 – 2023-02-18 (×2): qty 30, 30d supply, fill #2
  Filled 2023-03-04 – 2023-03-16 (×2): qty 30, 30d supply, fill #3
  Filled 2023-03-25 – 2023-04-08 (×2): qty 30, 30d supply, fill #4

## 2022-12-31 MED FILL — Duloxetine HCl Enteric Coated Pellets Cap 30 MG (Base Eq): ORAL | 30 days supply | Qty: 30 | Fill #3 | Status: AC

## 2023-01-01 ENCOUNTER — Other Ambulatory Visit (HOSPITAL_COMMUNITY): Payer: Self-pay

## 2023-01-12 ENCOUNTER — Other Ambulatory Visit: Payer: Self-pay

## 2023-01-18 ENCOUNTER — Other Ambulatory Visit (HOSPITAL_COMMUNITY): Payer: Self-pay

## 2023-01-18 ENCOUNTER — Other Ambulatory Visit: Payer: Self-pay | Admitting: Internal Medicine

## 2023-01-18 DIAGNOSIS — F419 Anxiety disorder, unspecified: Secondary | ICD-10-CM

## 2023-01-18 MED ORDER — DULOXETINE HCL 30 MG PO CPEP
30.0000 mg | ORAL_CAPSULE | Freq: Every day | ORAL | 1 refills | Status: DC
  Filled 2023-01-26: qty 30, 30d supply, fill #0
  Filled 2023-02-15 – 2023-02-18 (×2): qty 30, 30d supply, fill #1
  Filled 2023-03-04 – 2023-03-16 (×2): qty 30, 30d supply, fill #2
  Filled 2023-03-25 – 2023-04-08 (×2): qty 30, 30d supply, fill #3
  Filled 2023-05-04 – 2023-05-05 (×2): qty 30, 30d supply, fill #4
  Filled 2023-05-24 – 2023-06-01 (×3): qty 30, 30d supply, fill #5

## 2023-01-26 ENCOUNTER — Telehealth: Payer: Self-pay

## 2023-01-26 ENCOUNTER — Other Ambulatory Visit: Payer: Self-pay

## 2023-01-26 NOTE — Telephone Encounter (Signed)
 PAP: Application for Ozempic has been submitted to PAP Companies: [employer], online   Please note I have faxed the provider part of the application for Dr. [name] for review.

## 2023-01-29 NOTE — Telephone Encounter (Signed)
 Received provider portion of application, faxed completed provider form along with insurance cars to [employer] for review

## 2023-02-04 NOTE — Telephone Encounter (Signed)
PAP: Patient assistance application Ozempic for has been approved by PAP Companies: [employer] from 02/01/2023 to 01/19/2024. Medication should be delivered to PAP Delivery: Provider's office For further shipping updates, please contact [employer] at [phone] Pt ID is: [id number]

## 2023-02-04 NOTE — Progress Notes (Signed)
Pharmacy Medication Assistance Program Note    02/04/2023  Patient ID: Whitney Sanchez, female   DOB: 1955/04/08, 68 y.o.   MRN: [id number]     01/26/2023  Outreach Medication One  Initial Outreach Date (Medication One) 01/26/2023  Manufacturer Medication One [employer] Drugs Ozempic  Dose of Ozempic 1mg /week  Type of Radiographer, therapeutic Assistance  Date Application Sent to Prescriber 01/26/2023  Name of Prescriber [name]  Date Application Received From Provider 01/29/2023  Date Application Submitted to Manufacturer 01/29/2023  Method Application Sent to Manufacturer Fax  Patient Assistance Determination Approved  Approval Start Date 02/01/2023  Approval End Date 01/19/2024  Patient Notification Method Telephone Call  Telephone Call Outcome Left Voicemail     Signature

## 2023-02-15 ENCOUNTER — Other Ambulatory Visit: Payer: Self-pay

## 2023-02-18 ENCOUNTER — Other Ambulatory Visit: Payer: Self-pay

## 2023-02-22 ENCOUNTER — Encounter: Payer: Self-pay | Admitting: Physician Assistant

## 2023-03-04 ENCOUNTER — Other Ambulatory Visit: Payer: Self-pay

## 2023-03-16 ENCOUNTER — Other Ambulatory Visit: Payer: Self-pay

## 2023-03-22 ENCOUNTER — Other Ambulatory Visit: Payer: Self-pay

## 2023-03-25 ENCOUNTER — Other Ambulatory Visit: Payer: Self-pay

## 2023-03-30 ENCOUNTER — Telehealth: Payer: Self-pay | Admitting: Pharmacist

## 2023-03-30 DIAGNOSIS — E1165 Type 2 diabetes mellitus with hyperglycemia: Secondary | ICD-10-CM

## 2023-03-30 MED ORDER — OZEMPIC (1 MG/DOSE) 4 MG/3ML ~~LOC~~ SOPN: 1.0000 mg | PEN_INJECTOR | SUBCUTANEOUS | 1 refills | Status: AC

## 2023-03-30 NOTE — Progress Notes (Signed)
   03/30/2023  Patient ID: Whitney Sanchez, female   DOB: 12-May-1955, 68 y.o.   MRN: [id number]  Patient called about her Ozempic shipment.  [employer] was called with her on conference call.  She has been approved through 01/19/2024 but the company is experiencing shipping delays.  We were able to get her a free trial coupon for a one month supply.  Here is the billing information:  BIN: [id number] PCN: [id number] GRP: [id number] Voucher ID: [id number] Medication: Ozempic 1 mg/mL (1 pens x 24mL/pen)  A new prescription will be sent to the patient's pharmacy with the billing information attached.  Plan: Follow up to see if they were able to pick up the prescriptions.  Beecher Mcardle, PharmD, BCACP Clinical Pharmacist [phone]

## 2023-03-31 ENCOUNTER — Ambulatory Visit: Payer: Medicare PPO

## 2023-03-31 DIAGNOSIS — Z0000 Encounter for general adult medical examination without abnormal findings: Secondary | ICD-10-CM

## 2023-03-31 NOTE — Patient Instructions (Signed)
 Whitney Sanchez , Thank you for taking time to come for your Medicare Wellness Visit. I appreciate your ongoing commitment to your health goals. Please review the following plan we discussed and let me know if I can assist you in the future.   Referrals/Orders/Follow-Ups/Clinician Recommendations: none  This is a list of the screening recommended for you and due dates:  Health Maintenance  Topic Date Due   COVID-19 Vaccine (4 - 2024-25 season) 09/20/2022   Eye exam for diabetics  12/20/2022   Flu Shot  04/19/2023*   Hemoglobin A1C  06/22/2023   Yearly kidney function blood test for diabetes  12/22/2023   Yearly kidney health urinalysis for diabetes  12/22/2023   Complete foot exam   12/22/2023   Mammogram  02/05/2024   Medicare Annual Wellness Visit  03/30/2024   DTaP/Tdap/Td vaccine (2 - Td or Tdap) 04/02/2024   Colon Cancer Screening  07/18/2024   Pneumonia Vaccine  Completed   DEXA scan (bone density measurement)  Completed   Hepatitis C Screening  Completed   Zoster (Shingles) Vaccine  Completed   HPV Vaccine  Aged Out  *Topic was postponed. The date shown is not the original due date.    Advanced directives: (Copy Requested) Please bring a copy of your health care power of attorney and living will to the office to be added to your chart at your convenience. You can mail to [facility] [address] [place], [place] [address] or email to [email or web address]  Next Medicare Annual Wellness Visit scheduled for next year: Yes  insert Preventive Care attachment Insert FALL PREVENTION attachment if needed

## 2023-03-31 NOTE — Progress Notes (Signed)
 Subjective:   Whitney Sanchez is a 68 y.o. who presents for a Medicare Wellness preventive visit.  Visit Complete: Virtual I connected with  Andrya W Inge on 03/31/23 by a audio enabled telemedicine application and verified that I am speaking with the correct person using two identifiers.  Patient Location: Home  Provider Location: Office/Clinic  I discussed the limitations of evaluation and management by telemedicine. The patient expressed understanding and agreed to proceed.  Vital Signs: Because this visit was a virtual/telehealth visit, some criteria may be missing or patient reported. Any vitals not documented were not able to be obtained and vitals that have been documented are patient reported.  VideoError- Librarian, academic were attempted between this provider and patient, however failed, due to patient having technical difficulties OR patient did not have access to video capability.  We continued and completed visit with audio only.   AWV Questionnaire: No: Patient Medicare AWV questionnaire was not completed prior to this visit.  Cardiac Risk Factors include: advanced age (>30men, >13 women);diabetes mellitus;dyslipidemia;hypertension     Objective:    Today's Vitals   There is no height or weight on file to calculate BMI.     03/31/2023    3:57 PM 03/12/2022    2:29 PM 04/20/2021    9:00 PM 03/06/2021    3:32 PM 02/23/2020   10:54 AM  Advanced Directives  Does Patient Have a Medical Advance Directive? Yes No No No No  Type of Estate agent of [place];Living will      Copy of Healthcare Power of Attorney in Chart? No - copy requested      Would patient like information on creating a medical advance directive?   No - Patient declined      Current Medications (verified) Outpatient Encounter Medications as of 03/31/2023  Medication Sig   amLODipine (NORVASC) 5 MG tablet Take 1 tablet (5 mg total) by mouth at  bedtime.   aspirin EC 81 MG tablet Take 1 tablet (81 mg total) by mouth daily. Swallow whole.   blood glucose meter kit and supplies KIT Dispense based on patient and insurance preference. Use up to four times daily as directed. Dx code e11.65   carvedilol (COREG) 6.25 MG tablet Take 1 tablet (6.25 mg total) by mouth 2 (two) times daily with a meal.   diclofenac (VOLTAREN) 75 MG EC tablet Take 1 tablet (75 mg total) by mouth 2 (two) times daily.   DULoxetine (CYMBALTA) 30 MG capsule Take 1 capsule (30 mg total) by mouth daily.   hydrocortisone (ANUSOL-HC) 25 MG suppository PLACE 1 SUPPOSITORY RECTALLY 2 (TWO) TIMES DAILY AS NEEDED FOR HEMORRHOIDS OR ANAL ITCHING.   isosorbide mononitrate (IMDUR) 30 MG 24 hr tablet Take 1 tablet (30 mg total) by mouth daily.   nitroGLYCERIN (NITROSTAT) 0.4 MG SL tablet Place 1 tablet (0.4 mg total) under the tongue every 5 (five) minutes x 3 doses as needed for chest pain. If no relief after 3 doses, call 911.   rosuvastatin (CRESTOR) 20 MG tablet Take 1 tablet (20 mg total) by mouth daily.   Semaglutide, 1 MG/DOSE, (OZEMPIC, 1 MG/DOSE,) 4 MG/3ML SOPN Inject 1 mg into the skin once a week. Please bill to the free trial coupon   valsartan (DIOVAN) 160 MG tablet Take 1 tablet (160 mg total) by mouth daily.   No facility-administered encounter medications on file as of 03/31/2023.    Allergies (verified) Patient has no known allergies.  History: Past Medical History:  Diagnosis Date   DM (diabetes mellitus) (HCC)    High cholesterol    HTN (hypertension)    Past Surgical History:  Procedure Laterality Date   ABDOMINAL HYSTERECTOMY  1996   total   Family History  Problem Relation Age of Onset   Diabetes Mother    Healthy Father    Breast cancer Neg Hx    Neuropathy Neg Hx    Social History   Socioeconomic History   Marital status: Married    Spouse name: Not on file   Number of children: Not on file   Years of education: Not on file   Highest  education level: Not on file  Occupational History   Not on file  Tobacco Use   Smoking status: Never    Passive exposure: Never   Smokeless tobacco: Never  Vaping Use   Vaping status: Never Used  Substance and Sexual Activity   Alcohol use: Never   Drug use: Never   Sexual activity: Not on file  Other Topics Concern   Not on file  Social History Narrative   Not on file   Social Drivers of Health   Financial Resource Strain: Low Risk  (03/31/2023)   Overall Financial Resource Strain (CARDIA)    Difficulty of Paying Living Expenses: Not hard at all  Food Insecurity: No Food Insecurity (03/31/2023)   Hunger Vital Sign    Worried About Running Out of Food in the Last Year: Never true    Ran Out of Food in the Last Year: Never true  Transportation Needs: No Transportation Needs (03/31/2023)   PRAPARE - Administrator, Civil Service (Medical): No    Lack of Transportation (Non-Medical): No  Physical Activity: Insufficiently Active (03/31/2023)   Exercise Vital Sign    Days of Exercise per Week: 1 day    Minutes of Exercise per Session: 30 min  Stress: No Stress Concern Present (03/31/2023)   [employer] of Occupational Health - Occupational Stress Questionnaire    Feeling of Stress : Not at all  Social Connections: Moderately Integrated (03/31/2023)   Social Connection and Isolation Panel [NHANES]    Frequency of Communication with Friends and Family: More than three times a week    Frequency of Social Gatherings with Friends and Family: Not on file    Attends Religious Services: More than 4 times per year    Active Member of [employer] or Organizations: No    Attends Engineer, structural: Never    Marital Status: Married    Tobacco Counseling Counseling given: Not Answered    Clinical Intake:  Pre-visit preparation completed: Yes  Pain : No/denies pain     Nutritional Risks: None Diabetes: Yes CBG done?: No Did pt. bring in CBG monitor from  home?: No  How often do you need to have someone help you when you read instructions, pamphlets, or other written materials from your doctor or pharmacy?: 1 - Never  Interpreter Needed?: No  Information entered by :: NAllen LPN   Activities of Daily Living     03/31/2023    3:53 PM  In your present state of health, do you have any difficulty performing the following activities:  Hearing? 0  Vision? 0  Difficulty concentrating or making decisions? 0  Walking or climbing stairs? 0  Dressing or bathing? 0  Doing errands, shopping? 0  Preparing Food and eating ? N  Using the Toilet? N  In the  past six months, have you accidently leaked urine? Y  Comment with a cough or sneeze  Do you have problems with loss of bowel control? N  Managing your Medications? N  Managing your Finances? N  Housekeeping or managing your Housekeeping? N    Patient Care Team: [name], MD as PCP - General (Internal Medicine) Christell Constant, MD as PCP - Cardiology (Cardiology) Harlan Stains, [facility] (Inactive) (Pharmacist)  Indicate any recent Medical Services you may have received from other than [facility] providers in the past year (date may be approximate).     Assessment:   This is a routine wellness examination for Whitney Sanchez.  Hearing/Vision screen Hearing Screening - Comments:: Denies hearing issues Vision Screening - Comments:: Regular eye exams, [facility]   Goals Addressed             This Visit's Progress    Patient Stated       03/31/2023, denies goals       Depression Screen     03/31/2023    3:59 PM 12/22/2022    9:56 AM 09/03/2022    9:24 AM 05/11/2022    3:00 PM 03/12/2022    2:30 PM 03/11/2022    2:34 PM 12/01/2021    9:17 AM  PHQ 2/9 Scores  PHQ - 2 Score 0 0 0 0 0 0 2  PHQ- 9 Score 0 0 0 0   3    Fall Risk     03/31/2023    3:58 PM 12/22/2022    9:56 AM 09/03/2022    9:23 AM 05/11/2022    2:59 PM 03/12/2022    2:29 PM  Fall Risk   Falls in the past year? 0 0  0 0 0  Number falls in past yr: 0 0 0 0 0  Injury with Fall? 0 0 0 0 0  Risk for fall due to : Medication side effect No Fall Risks No Fall Risks No Fall Risks Medication side effect  Follow up Falls prevention discussed;Falls evaluation completed Falls evaluation completed Falls evaluation completed Falls evaluation completed Falls prevention discussed;Education provided;Falls evaluation completed    MEDICARE RISK AT HOME:  Medicare Risk at Home Any stairs in or around the home?: No If so, are there any without handrails?: No Home free of loose throw rugs in walkways, pet beds, electrical cords, etc?: Yes Adequate lighting in your home to reduce risk of falls?: Yes Life alert?: No Use of a cane, walker or w/c?: No Grab bars in the bathroom?: Yes Shower chair or bench in shower?: No Elevated toilet seat or a handicapped toilet?: No  TIMED UP AND GO:  Was the test performed?  No  Cognitive Function: 6CIT completed        03/31/2023    3:59 PM 03/12/2022    2:31 PM 03/06/2021    3:33 PM 02/23/2020    9:57 AM  6CIT Screen  What Year? 0 points 0 points 0 points 0 points  What month? 0 points 0 points 0 points 0 points  What time? 0 points 0 points 0 points 0 points  Count back from 20 0 points 0 points 0 points 0 points  Months in reverse 0 points 0 points 0 points 0 points  Repeat phrase 0 points 0 points 2 points 0 points  Total Score 0 points 0 points 2 points 0 points    Immunizations Immunization History  Administered Date(s) Administered   Fluad Quad(high Dose 65+) 10/18/2019, 12/31/2020, 12/01/2021  Influenza-Unspecified 10/19/2017   Moderna Covid-19 Vaccine Bivalent Booster 60yrs & up 01/14/2021   Moderna SARS-COV2 Booster Vaccination 11/17/2019   Moderna Sars-Covid-2 Vaccination 01/21/2019, 02/18/2019, 01/14/2021   PNEUMOCOCCAL CONJUGATE-20 02/18/2021   Pneumococcal Polysaccharide-23 02/05/2015   Tdap 04/03/2014   Zoster Recombinant(Shingrix) 09/16/2021,  03/11/2022    Screening Tests Health Maintenance  Topic Date Due   COVID-19 Vaccine (4 - 2024-25 season) 09/20/2022   OPHTHALMOLOGY EXAM  12/20/2022   INFLUENZA VACCINE  04/19/2023 (Originally 08/20/2022)   HEMOGLOBIN A1C  06/22/2023   Diabetic kidney evaluation - eGFR measurement  12/22/2023   Diabetic kidney evaluation - Urine ACR  12/22/2023   FOOT EXAM  12/22/2023   MAMMOGRAM  02/05/2024   Medicare Annual Wellness (AWV)  03/30/2024   DTaP/Tdap/Td (2 - Td or Tdap) 04/02/2024   Colonoscopy  07/18/2024   Pneumonia Vaccine 76+ Years old  Completed   DEXA SCAN  Completed   Hepatitis C Screening  Completed   Zoster Vaccines- Shingrix  Completed   HPV VACCINES  Aged Out    Health Maintenance  Health Maintenance Due  Topic Date Due   COVID-19 Vaccine (4 - 2024-25 season) 09/20/2022   OPHTHALMOLOGY EXAM  12/20/2022   Health Maintenance Items Addressed: Declines covid vaccine. States has an appointment with eye doctor coming up.  Additional Screening:  Vision Screening: Recommended annual ophthalmology exams for early detection of glaucoma and other disorders of the eye.  Dental Screening: Recommended annual dental exams for proper oral hygiene  Community Resource Referral / Chronic Care Management: CRR required this visit?  No   CCM required this visit?  No     Plan:     I have personally reviewed and noted the following in the patient's chart:   Medical and social history Use of alcohol, tobacco or illicit drugs  Current medications and supplements including opioid prescriptions. Patient is not currently taking opioid prescriptions. Functional ability and status Nutritional status Physical activity Advanced directives List of other physicians Hospitalizations, surgeries, and ER visits in previous 12 months Vitals Screenings to include cognitive, depression, and falls Referrals and appointments  In addition, I have reviewed and discussed with patient certain  preventive protocols, quality metrics, and best practice recommendations. A written personalized care plan for preventive services as well as general preventive health recommendations were provided to patient.     Barb Merino, LPN   04/27/8117   After Visit Summary: (Pick Up) Due to this being a telephonic visit, with patients personalized plan was offered to patient and patient has requested to Pick up at office.  Notes: Nothing significant to report at this time.

## 2023-04-01 ENCOUNTER — Other Ambulatory Visit: Payer: Self-pay | Admitting: Internal Medicine

## 2023-04-01 ENCOUNTER — Telehealth: Payer: Self-pay

## 2023-04-01 DIAGNOSIS — Z1231 Encounter for screening mammogram for malignant neoplasm of breast: Secondary | ICD-10-CM

## 2023-04-01 NOTE — Telephone Encounter (Signed)
 Patient notified that her meds have arrived here at the office and she can come pick them up. YL,RMA

## 2023-04-02 ENCOUNTER — Ambulatory Visit: Payer: Medicare PPO | Admitting: Physician Assistant

## 2023-04-02 ENCOUNTER — Encounter: Payer: Self-pay | Admitting: Physician Assistant

## 2023-04-02 VITALS — BP 144/86 | HR 81 | Ht 61.0 in | Wt 175.4 lb

## 2023-04-02 DIAGNOSIS — Z1211 Encounter for screening for malignant neoplasm of colon: Secondary | ICD-10-CM

## 2023-04-02 DIAGNOSIS — K642 Third degree hemorrhoids: Secondary | ICD-10-CM

## 2023-04-02 DIAGNOSIS — K625 Hemorrhage of anus and rectum: Secondary | ICD-10-CM

## 2023-04-02 DIAGNOSIS — R159 Full incontinence of feces: Secondary | ICD-10-CM | POA: Diagnosis not present

## 2023-04-02 MED ORDER — SUFLAVE 178.7 G PO SOLR: 1.0000 | Freq: Once | ORAL | 0 refills | Status: AC

## 2023-04-02 MED ORDER — HYDROCORTISONE ACETATE 25 MG RE SUPP: 25.0000 mg | Freq: Two times a day (BID) | RECTAL | 5 refills | Status: DC

## 2023-04-02 NOTE — Progress Notes (Signed)
 Chief Complaint: Rectal Bleeding, Hemorrhoids, Screening for CRC  HPI:    Whitney Sanchez is a 68 year old female with a past medical history as listed below including diabetes, hypertension and IBS, who was referred to me by [name], MD for a complaint of bleeding hemorrhoids and screening for colon cancer.      Per chart review it looks like patient's last colonoscopy was in 2012 with Dr. Loreta Ave.  We are requesting records today.    04/21/21 echocardiogram done for chest pain with a history of hypertension diabetes with LVEF 60-65%.  Grade 1 diastolic dysfunction.  No aortic stenosis.    Today, patient presents to clinic and tells me she has always had issues with hemorrhoids and previously saw another doctor for this in the past and was prescribed suppositories and creams and Hydrocortisone.  She has not seen them in quite a few years and tells me most recently she has been using over-the-counter products.  Regardless of this feels like her hemorrhoids prolapse daily and she sees bleeding with her bowel movements also mucus in her stools and on a pad which she now has to wear.  Occasionally has some pain, not recently.  Feels like things just kind of leak out of her rectum.  Does describe normal daily bowel movements that are soft and solid with no straining.  Does have history of 3 vaginal births.    Denies fever, chills or weight loss.      Past Medical History:  Diagnosis Date   Anemia    Arthritis    Depression    DM (diabetes mellitus) (HCC)    High cholesterol    HTN (hypertension)    IBS (irritable bowel syndrome)     Past Surgical History:  Procedure Laterality Date   ABDOMINAL HYSTERECTOMY  1996   total    Current Outpatient Medications  Medication Sig Dispense Refill   amLODipine (NORVASC) 5 MG tablet Take 1 tablet (5 mg total) by mouth at bedtime. 90 tablet 1   aspirin EC 81 MG tablet Take 1 tablet (81 mg total) by mouth daily. Swallow whole. 90 tablet 1   blood  glucose meter kit and supplies KIT Dispense based on patient and insurance preference. Use up to four times daily as directed. Dx code e11.65 1 each 4   carvedilol (COREG) 6.25 MG tablet Take 1 tablet (6.25 mg total) by mouth 2 (two) times daily with a meal. 180 tablet 3   diclofenac (VOLTAREN) 75 MG EC tablet Take 1 tablet (75 mg total) by mouth 2 (two) times daily. 30 tablet 2   DULoxetine (CYMBALTA) 30 MG capsule Take 1 capsule (30 mg total) by mouth daily. 90 capsule 1   FERROUS SULFATE PO Take 65 mg by mouth daily.     hydrocortisone (ANUSOL-HC) 25 MG suppository PLACE 1 SUPPOSITORY RECTALLY 2 (TWO) TIMES DAILY AS NEEDED FOR HEMORRHOIDS OR ANAL ITCHING. 12 suppository 0   isosorbide mononitrate (IMDUR) 30 MG 24 hr tablet Take 1 tablet (30 mg total) by mouth daily. 90 tablet 3   rosuvastatin (CRESTOR) 20 MG tablet Take 1 tablet (20 mg total) by mouth daily. 90 tablet 3   Semaglutide, 1 MG/DOSE, (OZEMPIC, 1 MG/DOSE,) 4 MG/3ML SOPN Inject 1 mg into the skin once a week. Please bill to the free trial coupon 3 mL 1   nitroGLYCERIN (NITROSTAT) 0.4 MG SL tablet Place 1 tablet (0.4 mg total) under the tongue every 5 (five) minutes x 3 doses as needed for  chest pain. If no relief after 3 doses, call 911. (Patient not taking: Reported on 04/02/2023) 25 tablet 3   valsartan (DIOVAN) 160 MG tablet Take 1 tablet (160 mg total) by mouth daily. 90 tablet 2   No current facility-administered medications for this visit.    Allergies as of 04/02/2023   (No Known Allergies)    Family History  Problem Relation Age of Onset   Diabetes Mother    Healthy Father    Breast cancer Neg Hx    Neuropathy Neg Hx     Social History   Socioeconomic History   Marital status: Married    Spouse name: Not on file   Number of children: 3   Years of education: Not on file   Highest education level: Not on file  Occupational History   Occupation: retired  Tobacco Use   Smoking status: Never    Passive exposure:  Never   Smokeless tobacco: Never  Vaping Use   Vaping status: Never Used  Substance and Sexual Activity   Alcohol use: Never   Drug use: Never   Sexual activity: Not on file  Other Topics Concern   Not on file  Social History Narrative   Not on file   Social Drivers of Health   Financial Resource Strain: Low Risk  (03/31/2023)   Overall Financial Resource Strain (CARDIA)    Difficulty of Paying Living Expenses: Not hard at all  Food Insecurity: No Food Insecurity (03/31/2023)   Hunger Vital Sign    Worried About Running Out of Food in the Last Year: Never true    Ran Out of Food in the Last Year: Never true  Transportation Needs: No Transportation Needs (03/31/2023)   PRAPARE - Administrator, Civil Service (Medical): No    Lack of Transportation (Non-Medical): No  Physical Activity: Insufficiently Active (03/31/2023)   Exercise Vital Sign    Days of Exercise per Week: 1 day    Minutes of Exercise per Session: 30 min  Stress: No Stress Concern Present (03/31/2023)   [employer] of Occupational Health - Occupational Stress Questionnaire    Feeling of Stress : Not at all  Social Connections: Moderately Integrated (03/31/2023)   Social Connection and Isolation Panel [NHANES]    Frequency of Communication with Friends and Family: More than three times a week    Frequency of Social Gatherings with Friends and Family: Not on file    Attends Religious Services: More than 4 times per year    Active Member of [employer] or Organizations: No    Attends Banker Meetings: Never    Marital Status: Married  Catering manager Violence: Not At Risk (03/31/2023)   Humiliation, Afraid, Rape, and Kick questionnaire    Fear of Current or Ex-Partner: No    Emotionally Abused: No    Physically Abused: No    Sexually Abused: No    Review of Systems:    Constitutional: No weight loss, fever or chills Skin: No rash  Cardiovascular: No chest pain  Respiratory: No SOB   Gastrointestinal: See HPI and otherwise negative Genitourinary: No dysuria Neurological: No headache, dizziness or syncope Musculoskeletal: No new muscle or joint pain Hematologic: No bruising Psychiatric: No history of depression or anxiety   Physical Exam:  Vital signs: BP (!) 144/86 (BP Location: Right Arm, Patient Position: Sitting, Cuff Size: Normal)   Pulse 81   Ht 5\' 1"  (1.549 m)   Wt 175 lb 6.4 oz (79.6 kg)  BMI 33.14 kg/m    Constitutional:   Pleasant female appears to be in NAD, Well developed, Well nourished, alert and cooperative Head:  Normocephalic and atraumatic. Eyes:   PEERL, EOMI. No icterus. Conjunctiva pink. Ears:  Normal auditory acuity. Neck:  Supple Throat: Oral cavity and pharynx without inflammation, swelling or lesion.  Respiratory: Respirations even and unlabored. Lungs clear to auscultation bilaterally.   No wheezes, crackles, or rhonchi.  Cardiovascular: Normal S1, S2. No MRG. Regular rate and rhythm. No peripheral edema, cyanosis or pallor.  Gastrointestinal:  Soft, nondistended, nontender. No rebound or guarding. Normal bowel sounds. No appreciable masses or hepatomegaly. Rectal: External: Grade 3 hemorrhoids, posterior anal tag; internal: Palpable hemorrhoids, no TTP; and anoscopy: Grade 3 hemorrhoid with stigmata of recent bleeding Msk:  Symmetrical without gross deformities. Without edema, no deformity or joint abnormality.  Neurologic:  Alert and  oriented x4;  grossly normal neurologically.  Skin:   Dry and intact without significant lesions or rashes. Psychiatric: Demonstrates good judgement and reason without abnormal affect or behaviors.  RELEVANT LABS AND IMAGING: CBC    Component Value Date/Time   WBC 7.2 12/22/2022 1055   WBC 5.9 04/21/2021 0222   RBC 4.29 12/22/2022 1055   RBC 3.75 (L) 04/21/2021 0222   HGB 12.2 12/22/2022 1055   HCT 38.9 12/22/2022 1055   PLT 266 12/22/2022 1055   MCV 91 12/22/2022 1055   MCH 28.4 12/22/2022  1055   MCH 28.8 04/21/2021 0222   MCHC 31.4 (L) 12/22/2022 1055   MCHC 32.2 04/21/2021 0222   RDW 14.4 12/22/2022 1055   LYMPHSABS 1.5 10/12/2022 1218   EOSABS 0.1 10/12/2022 1218   BASOSABS 0.0 10/12/2022 1218    CMP     Component Value Date/Time   NA 139 12/22/2022 1055   K 4.3 12/22/2022 1055   CL 102 12/22/2022 1055   CO2 24 12/22/2022 1055   GLUCOSE 103 (H) 12/22/2022 1055   GLUCOSE 98 04/21/2021 0222   BUN 14 12/22/2022 1055   CREATININE 0.72 12/22/2022 1055   CALCIUM 9.8 12/22/2022 1055   PROT 7.4 09/03/2022 1039   ALBUMIN 4.6 09/03/2022 1039   AST 17 09/03/2022 1039   ALT 12 09/03/2022 1039   ALKPHOS 79 09/03/2022 1039   BILITOT 0.4 09/03/2022 1039   GFRNONAA >60 04/21/2021 0222   GFRAA 80 02/23/2020 1133    Assessment: 1.  Grade 3 internal hemorrhoids with bleeding: Battling this for years with what sounds like over-the-counter creams for the most part with occasional prescription hydrocortisone, most recently a lot of mucus as well as some fecal leakage related to this 2.  Screening for colorectal cancer: Patient is 67, last colonoscopy in 2012, she is overdue  Plan: 1.  Scheduled the patient for screening colonoscopy in the [facility] with Dr. Lavon Paganini.  Did provide the patient a detailed list of risks for the procedure and she agrees to proceed. Patient is appropriate for endoscopic procedure(s) in the ambulatory ([facility]) setting.  2.  Prescribed Hydrocortisone suppositories 25 mg twice daily for 7 days #14 with 1 refill. 3.  Discussed banding procedure with the patient.  She would likely be a very good candidate for this.  Hemorrhoids can be evaluated further at time of colonoscopy and banding can be set up after if she is interested. 4.  Requesting records from last colonoscopy Dr. Kenna Gilbert office over 10 years ago 5.  Patient to follow in clinic per recommendations from Dr. Lavon Paganini after time of procedure.  Hyacinth Meeker,  PA-C [facility] 04/02/2023,  9:33 AM  Cc: [name], MD

## 2023-04-02 NOTE — Patient Instructions (Addendum)
 We have sent the following medications to your pharmacy for you to pick up at your convenience: Hydrocortisone Suppositories twice daily.  You have been scheduled for a colonoscopy. Please follow written instructions given to you at your visit today.   If you use inhalers (even only as needed), please bring them with you on the day of your procedure.  DO NOT TAKE 7 DAYS PRIOR TO TEST- Trulicity (dulaglutide) Ozempic, Wegovy (semaglutide) Mounjaro (tirzepatide) Bydureon Bcise (exanatide extended release)  DO NOT TAKE 1 DAY PRIOR TO YOUR TEST Rybelsus (semaglutide) Adlyxin (lixisenatide) Victoza (liraglutide) Byetta (exanatide) ___________________________________________________________________________  Whitney Sanchez will receive your bowel preparation through [facility], which ensures the lowest copay and home delivery, with outreach via text or call from an [phone] number. Please respond promptly to avoid rescheduling of your procedure. If you are interested in alternative options or have any questions regarding your prep, please contact them at [phone] ____________________________________________________________________________  Your Provider Has Sent Your Bowel Prep Regimen To [facility]   [facility] will contact you to verify your information and collect your copay, if applicable. Enjoy the comfort of your home while your prescription is mailed to you, FREE of any shipping charges.   [facility] accepts all major insurance benefits and applies discounts & coupons.  Have additional questions?   Chat: [email or web address] Call: [phone] Email: [email or web address] NCPDP: [id number]  How will [facility] contact you?  With a Welcome phone call,  a Welcome text and a checkout link in text form.  Texts you receive from [phone] Are NOT Spam.  *To set up delivery, you must complete the checkout process via link or speak to one of the patient care representatives. If  [facility] is unable to reach you, your prescription may be delayed.  To avoid long hold times on the phone, you may also utilize the secure chat feature on the [facility] website to request that they call you back for transaction completion or to expedite your concerns. _______________________________________________________  If your blood pressure at your visit was 140/90 or greater, please contact your primary care physician to follow up on this.  _______________________________________________________  If you are age 73 or older, your body mass index should be between 23-30. Your Body mass index is 33.14 kg/m. If this is out of the aforementioned range listed, please consider follow up with your Primary Care Provider.  If you are age 56 or younger, your body mass index should be between 19-25. Your Body mass index is 33.14 kg/m. If this is out of the aformentioned range listed, please consider follow up with your Primary Care Provider.   ________________________________________________________  The [facility] providers would like to encourage you to use [facility] to communicate with providers for non-urgent requests or questions.  Due to long hold times on the telephone, sending your provider a message by [facility] may be a faster and more efficient way to get a response.  Please allow 48 business hours for a response.  Please remember that this is for non-urgent requests.  _______________________________________________________

## 2023-04-05 ENCOUNTER — Ambulatory Visit
Admission: RE | Admit: 2023-04-05 | Discharge: 2023-04-05 | Disposition: A | Discharge status: Home / Self Care | Source: Ambulatory Visit | Attending: Internal Medicine | Admitting: Internal Medicine

## 2023-04-05 DIAGNOSIS — Z1231 Encounter for screening mammogram for malignant neoplasm of breast: Secondary | ICD-10-CM

## 2023-04-08 ENCOUNTER — Other Ambulatory Visit: Payer: Self-pay

## 2023-04-19 ENCOUNTER — Encounter: Payer: Self-pay | Admitting: Certified Registered Nurse Anesthetist

## 2023-04-23 ENCOUNTER — Other Ambulatory Visit: Payer: Self-pay | Admitting: *Deleted

## 2023-04-23 ENCOUNTER — Encounter: Payer: Self-pay | Admitting: Gastroenterology

## 2023-04-23 ENCOUNTER — Ambulatory Visit: Admitting: Gastroenterology

## 2023-04-23 VITALS — BP 94/59 | HR 69 | Temp 97.3°F | Resp 16 | Ht 61.0 in | Wt 175.0 lb

## 2023-04-23 DIAGNOSIS — I10 Essential (primary) hypertension: Secondary | ICD-10-CM | POA: Diagnosis not present

## 2023-04-23 DIAGNOSIS — E119 Type 2 diabetes mellitus without complications: Secondary | ICD-10-CM | POA: Diagnosis not present

## 2023-04-23 DIAGNOSIS — K649 Unspecified hemorrhoids: Secondary | ICD-10-CM | POA: Diagnosis not present

## 2023-04-23 DIAGNOSIS — F32A Depression, unspecified: Secondary | ICD-10-CM | POA: Diagnosis not present

## 2023-04-23 DIAGNOSIS — K642 Third degree hemorrhoids: Secondary | ICD-10-CM

## 2023-04-23 DIAGNOSIS — Z1211 Encounter for screening for malignant neoplasm of colon: Secondary | ICD-10-CM | POA: Diagnosis not present

## 2023-04-23 MED ORDER — SODIUM CHLORIDE 0.9 % IV SOLN: 500.0000 mL | Freq: Once | INTRAVENOUS | Status: DC

## 2023-04-23 NOTE — Progress Notes (Signed)
 Pt's states no medical or surgical changes since previsit or office visit.   Pt stated that she took her Semaglutide on 04/20/23 - CRNA Charlsie Merles notified.

## 2023-04-23 NOTE — Progress Notes (Signed)
 [facility] History and Physical   Primary Care Physician:  [name], MD   Reason for Procedure:  Colorectal cancer screening  Plan:    Screening colonoscopy with possible interventions as needed     HPI: Whitney Sanchez is a very pleasant 68 y.o. female here for screening colonoscopy. Denies any nausea, vomiting, abdominal pain, melena or bright red blood per rectum  The risks and benefits as well as alternatives of endoscopic procedure(s) have been discussed and reviewed. All questions answered. The patient agrees to proceed.    Past Medical History:  Diagnosis Date   Anemia    Arthritis    Depression    DM (diabetes mellitus) (HCC)    High cholesterol    HTN (hypertension)    IBS (irritable bowel syndrome)     Past Surgical History:  Procedure Laterality Date   ABDOMINAL HYSTERECTOMY  1996   total    Prior to Admission medications   Medication Sig Start Date End Date Taking? Authorizing Provider  amLODipine (NORVASC) 5 MG tablet Take 1 tablet (5 mg total) by mouth at bedtime. 12/31/22 12/31/23 Yes [name], MD  aspirin EC 81 MG tablet Take 1 tablet (81 mg total) by mouth daily. Swallow whole. 07/17/21  Yes [name], MD  blood glucose meter kit and supplies KIT Dispense based on patient and insurance preference. Use up to four times daily as directed. Dx code e11.65 04/29/21  Yes [name], MD  carvedilol (COREG) 6.25 MG tablet Take 1 tablet (6.25 mg total) by mouth 2 (two) times daily with a meal. 06/22/22  Yes [name], MD  DULoxetine (CYMBALTA) 30 MG capsule Take 1 capsule (30 mg total) by mouth daily. 01/18/23  Yes [name], MD  isosorbide mononitrate (IMDUR) 30 MG 24 hr tablet Take 1 tablet (30 mg total) by mouth daily. 06/22/22  Yes [name], MD  rosuvastatin (CRESTOR) 20 MG tablet Take 1 tablet (20 mg total) by mouth daily. 06/22/22  Yes [name], MD  valsartan (DIOVAN) 160 MG tablet Take 1 tablet (160 mg  total) by mouth daily. 12/30/21 04/23/23 Yes [name], MD  diclofenac (VOLTAREN) 75 MG EC tablet Take 1 tablet (75 mg total) by mouth 2 (two) times daily. 05/21/22   Tarry Kos, MD  FERROUS SULFATE PO Take 65 mg by mouth daily.    [provider]  hydrocortisone (ANUSOL-HC) 25 MG suppository Place 1 suppository (25 mg total) rectally 2 (two) times daily. Patient not taking: Reported on 04/23/2023 04/02/23   Unk Lightning, PA  nitroGLYCERIN (NITROSTAT) 0.4 MG SL tablet Place 1 tablet (0.4 mg total) under the tongue every 5 (five) minutes x 3 doses as needed for chest pain. If no relief after 3 doses, call 911. Patient not taking: Reported on 04/23/2023 09/23/22   [name], MD  Semaglutide, 1 MG/DOSE, (OZEMPIC, 1 MG/DOSE,) 4 MG/3ML SOPN Inject 1 mg into the skin once a week. Please bill to the free trial coupon 03/30/23   [name], MD    Current Outpatient Medications  Medication Sig Dispense Refill   amLODipine (NORVASC) 5 MG tablet Take 1 tablet (5 mg total) by mouth at bedtime. 90 tablet 1   aspirin EC 81 MG tablet Take 1 tablet (81 mg total) by mouth daily. Swallow whole. 90 tablet 1   blood glucose meter kit and supplies KIT Dispense based on patient and insurance preference. Use up to four times daily as directed. Dx code e11.65 1 each 4  carvedilol (COREG) 6.25 MG tablet Take 1 tablet (6.25 mg total) by mouth 2 (two) times daily with a meal. 180 tablet 3   DULoxetine (CYMBALTA) 30 MG capsule Take 1 capsule (30 mg total) by mouth daily. 90 capsule 1   isosorbide mononitrate (IMDUR) 30 MG 24 hr tablet Take 1 tablet (30 mg total) by mouth daily. 90 tablet 3   rosuvastatin (CRESTOR) 20 MG tablet Take 1 tablet (20 mg total) by mouth daily. 90 tablet 3   valsartan (DIOVAN) 160 MG tablet Take 1 tablet (160 mg total) by mouth daily. 90 tablet 2   diclofenac (VOLTAREN) 75 MG EC tablet Take 1 tablet (75 mg total) by mouth 2 (two) times daily. 30 tablet 2   FERROUS SULFATE  PO Take 65 mg by mouth daily.     hydrocortisone (ANUSOL-HC) 25 MG suppository Place 1 suppository (25 mg total) rectally 2 (two) times daily. (Patient not taking: Reported on 04/23/2023) 14 suppository 5   nitroGLYCERIN (NITROSTAT) 0.4 MG SL tablet Place 1 tablet (0.4 mg total) under the tongue every 5 (five) minutes x 3 doses as needed for chest pain. If no relief after 3 doses, call 911. (Patient not taking: Reported on 04/23/2023) 25 tablet 3   Semaglutide, 1 MG/DOSE, (OZEMPIC, 1 MG/DOSE,) 4 MG/3ML SOPN Inject 1 mg into the skin once a week. Please bill to the free trial coupon 3 mL 1   Current Facility-Administered Medications  Medication Dose Route Frequency Provider Last Rate Last Admin   0.9 %  sodium chloride infusion  500 mL Intravenous Once Napoleon Form, MD        Allergies as of 04/23/2023   (No Known Allergies)    Family History  Problem Relation Age of Onset   Diabetes Mother    Healthy Father    Breast cancer Neg Hx    Neuropathy Neg Hx    Colon cancer Neg Hx    Esophageal cancer Neg Hx    Rectal cancer Neg Hx    Stomach cancer Neg Hx     Social History   Socioeconomic History   Marital status: Married    Spouse name: Not on file   Number of children: 3   Years of education: Not on file   Highest education level: Not on file  Occupational History   Occupation: retired  Tobacco Use   Smoking status: Never    Passive exposure: Never   Smokeless tobacco: Never  Vaping Use   Vaping status: Never Used  Substance and Sexual Activity   Alcohol use: Never   Drug use: Never   Sexual activity: Not on file  Other Topics Concern   Not on file  Social History Narrative   Not on file   Social Drivers of Health   Financial Resource Strain: Low Risk  (03/31/2023)   Overall Financial Resource Strain (CARDIA)    Difficulty of Paying Living Expenses: Not hard at all  Food Insecurity: No Food Insecurity (03/31/2023)   Hunger Vital Sign    Worried About Running  Out of Food in the Last Year: Never true    Ran Out of Food in the Last Year: Never true  Transportation Needs: No Transportation Needs (03/31/2023)   PRAPARE - Administrator, Civil Service (Medical): No    Lack of Transportation (Non-Medical): No  Physical Activity: Insufficiently Active (03/31/2023)   Exercise Vital Sign    Days of Exercise per Week: 1 day    Minutes of  Exercise per Session: 30 min  Stress: No Stress Concern Present (03/31/2023)   [employer] of Occupational Health - Occupational Stress Questionnaire    Feeling of Stress : Not at all  Social Connections: Moderately Integrated (03/31/2023)   Social Connection and Isolation Panel [NHANES]    Frequency of Communication with Friends and Family: More than three times a week    Frequency of Social Gatherings with Friends and Family: Not on file    Attends Religious Services: More than 4 times per year    Active Member of [employer] or Organizations: No    Attends Banker Meetings: Never    Marital Status: Married  Catering manager Violence: Not At Risk (03/31/2023)   Humiliation, Afraid, Rape, and Kick questionnaire    Fear of Current or Ex-Partner: No    Emotionally Abused: No    Physically Abused: No    Sexually Abused: No    Review of Systems:  All other review of systems negative except as mentioned in the HPI.  Physical Exam: Vital signs in last 24 hours: BP (!) 141/86   Pulse 77   Temp (!) 97.3 F (36.3 C) (Temporal)   Ht 5\' 1"  (1.549 m)   Wt 175 lb (79.4 kg)   SpO2 97%   BMI 33.07 kg/m  General:   Alert, NAD Lungs:  Clear .   Heart:  Regular rate and rhythm Abdomen:  Soft, nontender and nondistended. Neuro/Psych:  Alert and cooperative. Normal mood and affect. A and O x 3  Reviewed labs, radiology imaging, old records and pertinent past GI work up  Patient is appropriate for planned procedure(s) and anesthesia in an ambulatory setting   K. Scherry Ran ,  MD [phone]

## 2023-04-23 NOTE — Patient Instructions (Signed)
 Discharge instructions given. Resume previous medications. Patient to be rescheduled for repeat Colonoscopy and previsit. Done. YOU HAD AN ENDOSCOPIC PROCEDURE TODAY AT THE [facility]:   Refer to the procedure report that was given to you for any specific questions about what was found during the examination.  If the procedure report does not answer your questions, please call your gastroenterologist to clarify.  If you requested that your care partner not be given the details of your procedure findings, then the procedure report has been included in a sealed envelope for you to review at your convenience later.  YOU SHOULD EXPECT: Some feelings of bloating in the abdomen. Passage of more gas than usual.  Walking can help get rid of the air that was put into your GI tract during the procedure and reduce the bloating. If you had a lower endoscopy (such as a colonoscopy or flexible sigmoidoscopy) you may notice spotting of blood in your stool or on the toilet paper. If you underwent a bowel prep for your procedure, you may not have a normal bowel movement for a few days.  Please Note:  You might notice some irritation and congestion in your nose or some drainage.  This is from the oxygen used during your procedure.  There is no need for concern and it should clear up in a day or so.  SYMPTOMS TO REPORT IMMEDIATELY:  Following lower endoscopy (colonoscopy or flexible sigmoidoscopy):  Excessive amounts of blood in the stool  Significant tenderness or worsening of abdominal pains  Swelling of the abdomen that is new, acute  Fever of 100F or higher   For urgent or emergent issues, a gastroenterologist can be reached at any hour by calling [phone]. Do not use MyChart messaging for urgent concerns.    DIET:  We do recommend a small meal at first, but then you may proceed to your regular diet.  Drink plenty of fluids but you should avoid alcoholic beverages for 24  hours.  ACTIVITY:  You should plan to take it easy for the rest of today and you should NOT DRIVE or use heavy machinery until tomorrow (because of the sedation medicines used during the test).    FOLLOW UP: Our staff will call the number listed on your records the next business day following your procedure.  We will call around 7:15- 8:00 am to check on you and address any questions or concerns that you may have regarding the information given to you following your procedure. If we do not reach you, we will leave a message.     If any biopsies were taken you will be contacted by phone or by letter within the next 1-3 weeks.  Please call us at [phone] if you have not heard about the biopsies in 3 weeks.    SIGNATURES/CONFIDENTIALITY: You and/or your care partner have signed paperwork which will be entered into your electronic medical record.  These signatures attest to the fact that that the information above on your After Visit Summary has been reviewed and is understood.  Full responsibility of the confidentiality of this discharge information lies with you and/or your care-partner.

## 2023-04-23 NOTE — Progress Notes (Signed)
 Report given to PACU, vss

## 2023-04-23 NOTE — Op Note (Signed)
 [facility] Patient Name: Whitney Sanchez Procedure Date: 04/23/2023 10:52 AM MRN: [id number] Endoscopist: Napoleon Form , MD, [id number] Age: 68 Referring MD:  Date of Birth: 08/24/1955 Gender: Female Account #: [id number] Procedure:                Colonoscopy Indications:              Screening for colorectal malignant neoplasm Medicines:                Monitored Anesthesia Care Procedure:                Pre-Anesthesia Assessment:                           - Prior to the procedure, a History and Physical                            was performed, and patient medications and                            allergies were reviewed. The patient's tolerance of                            previous anesthesia was also reviewed. The risks                            and benefits of the procedure and the sedation                            options and risks were discussed with the patient.                            All questions were answered, and informed consent                            was obtained. Prior Anticoagulants: The patient has                            taken no anticoagulant or antiplatelet agents. ASA                            Grade Assessment: II - A patient with mild systemic                            disease. After reviewing the risks and benefits,                            the patient was deemed in satisfactory condition to                            undergo the procedure.                           After obtaining informed consent, the colonoscope  was passed under direct vision. Throughout the                            procedure, the patient's blood pressure, pulse, and                            oxygen saturations were monitored continuously. The                            Olympus Scope SN: [id number] was introduced through                            the anus with the intention of advancing to the                            cecum.  The scope was advanced to the sigmoid colon                            before the procedure was aborted. Medications were                            given. The colonoscopy was performed without                            difficulty. The patient tolerated the procedure                            well. The quality of the bowel preparation was                            poor. The rectum was photographed. Scope In: 10:57:07 AM Scope Out: 10:58:23 AM Total Procedure Duration: 0 hours 1 minute 16 seconds  Findings:                 Hemorrhoids were found on perianal exam.                           A moderate amount of stool was found in the rectum                            and in the sigmoid colon, interfering with                            visualization. Aborted Complications:            No immediate complications. Estimated Blood Loss:     Estimated blood loss was minimal. Impression:               - Preparation of the colon was poor.                           - Hemorrhoids found on perianal exam.                           - Stool in the rectum  and in the sigmoid colon.                            Aborted                           - No specimens collected. Recommendation:           - Patient has a contact number available for                            emergencies. The signs and symptoms of potential                            delayed complications were discussed with the                            patient. Return to normal activities tomorrow.                            Written discharge instructions were provided to the                            patient.                           - Resume previous diet.                           - Continue present medications.                           - Repeat colonoscopy at the next available                            appointment because the bowel preparation was                            suboptimal.                           - For future  colonoscopy the patient will require                            an extended preparation. If there are any                            questions, please contact the gastroenterologist. Napoleon Form, MD 04/23/2023 11:01:50 AM This report has been signed electronically.

## 2023-04-26 ENCOUNTER — Telehealth: Payer: Self-pay

## 2023-04-26 NOTE — Telephone Encounter (Signed)
 Post procedure follow up call, no answer

## 2023-04-27 ENCOUNTER — Ambulatory Visit: Payer: Medicare PPO | Admitting: Internal Medicine

## 2023-04-27 NOTE — Progress Notes (Deleted)
 I,Rosa Wyly T Deloria Lair, CMA,acting as a Neurosurgeon for Gwynneth Aliment, MD.,have documented all relevant documentation on the behalf of Gwynneth Aliment, MD,as directed by  Gwynneth Aliment, MD while in the presence of Gwynneth Aliment, MD.  Subjective:  Patient ID: Whitney Sanchez , female    DOB: 06-26-1955 , 68 y.o.   MRN: [id number]  No chief complaint on file.   HPI      Diabetes She presents for her follow-up diabetic visit. She has type 2 diabetes mellitus. Her disease course has been stable. Pertinent negatives for hypoglycemia include no dizziness. Pertinent negatives for diabetes include no blurred vision, no polydipsia, no polyphagia and no polyuria. There are no hypoglycemic complications. Risk factors for coronary artery disease include diabetes mellitus, dyslipidemia, hypertension, sedentary lifestyle, post-menopausal and obesity. She participates in exercise intermittently. An ACE inhibitor/angiotensin II receptor blocker is being taken.  Hypertension This is a chronic problem. The current episode started more than 1 year ago. The problem has been gradually improving since onset. The problem is uncontrolled. Pertinent negatives include no blurred vision. Risk factors for coronary artery disease include dyslipidemia and diabetes mellitus. The current treatment provides moderate improvement.     Past Medical History:  Diagnosis Date   Anemia    Arthritis    Depression    DM (diabetes mellitus) (HCC)    High cholesterol    HTN (hypertension)    IBS (irritable bowel syndrome)      Family History  Problem Relation Age of Onset   Diabetes Mother    Healthy Father    Breast cancer Neg Hx    Neuropathy Neg Hx    Colon cancer Neg Hx    Esophageal cancer Neg Hx    Rectal cancer Neg Hx    Stomach cancer Neg Hx      Current Outpatient Medications:    amLODipine (NORVASC) 5 MG tablet, Take 1 tablet (5 mg total) by mouth at bedtime., Disp: 90 tablet, Rfl: 1   aspirin EC 81 MG  tablet, Take 1 tablet (81 mg total) by mouth daily. Swallow whole., Disp: 90 tablet, Rfl: 1   blood glucose meter kit and supplies KIT, Dispense based on patient and insurance preference. Use up to four times daily as directed. Dx code e11.65, Disp: 1 each, Rfl: 4   carvedilol (COREG) 6.25 MG tablet, Take 1 tablet (6.25 mg total) by mouth 2 (two) times daily with a meal., Disp: 180 tablet, Rfl: 3   diclofenac (VOLTAREN) 75 MG EC tablet, Take 1 tablet (75 mg total) by mouth 2 (two) times daily., Disp: 30 tablet, Rfl: 2   DULoxetine (CYMBALTA) 30 MG capsule, Take 1 capsule (30 mg total) by mouth daily., Disp: 90 capsule, Rfl: 1   FERROUS SULFATE PO, Take 65 mg by mouth daily., Disp: , Rfl:    hydrocortisone (ANUSOL-HC) 25 MG suppository, Place 1 suppository (25 mg total) rectally 2 (two) times daily. (Patient not taking: Reported on 04/23/2023), Disp: 14 suppository, Rfl: 5   isosorbide mononitrate (IMDUR) 30 MG 24 hr tablet, Take 1 tablet (30 mg total) by mouth daily., Disp: 90 tablet, Rfl: 3   nitroGLYCERIN (NITROSTAT) 0.4 MG SL tablet, Place 1 tablet (0.4 mg total) under the tongue every 5 (five) minutes x 3 doses as needed for chest pain. If no relief after 3 doses, call 911. (Patient not taking: Reported on 04/23/2023), Disp: 25 tablet, Rfl: 3   rosuvastatin (CRESTOR) 20 MG tablet, Take 1 tablet (20 mg  total) by mouth daily., Disp: 90 tablet, Rfl: 3   Semaglutide, 1 MG/DOSE, (OZEMPIC, 1 MG/DOSE,) 4 MG/3ML SOPN, Inject 1 mg into the skin once a week. Please bill to the free trial coupon, Disp: 3 mL, Rfl: 1   valsartan (DIOVAN) 160 MG tablet, Take 1 tablet (160 mg total) by mouth daily., Disp: 90 tablet, Rfl: 2   No Known Allergies   Review of Systems  Constitutional: Negative.   Eyes:  Negative for blurred vision.  Respiratory: Negative.    Cardiovascular: Negative.   Endocrine: Negative for polydipsia, polyphagia and polyuria.  Neurological: Negative.  Negative for dizziness.   Psychiatric/Behavioral: Negative.       There were no vitals filed for this visit. There is no height or weight on file to calculate BMI.  Wt Readings from Last 3 Encounters:  04/23/23 175 lb (79.4 kg)  04/02/23 175 lb 6.4 oz (79.6 kg)  12/22/22 175 lb (79.4 kg)     Objective:  Physical Exam      Assessment And Plan:  Hypertensive heart disease without heart failure  Dyslipidemia associated with type 2 diabetes mellitus (HCC)  Aortic atherosclerosis (HCC)  Other iron deficiency anemia  Hemorrhoids, unspecified hemorrhoid type     No follow-ups on file.  Patient was given opportunity to ask questions. Patient verbalized understanding of the plan and was able to repeat key elements of the plan. All questions were answered to their satisfaction.  Gwynneth Aliment, MD  I, Gwynneth Aliment, MD, have reviewed all documentation for this visit. The documentation on 04/27/23 for the exam, diagnosis, procedures, and orders are all accurate and complete.   IF YOU HAVE BEEN REFERRED TO A SPECIALIST, IT MAY TAKE 1-2 WEEKS TO SCHEDULE/PROCESS THE REFERRAL. IF YOU HAVE NOT HEARD FROM US/SPECIALIST IN TWO WEEKS, PLEASE GIVE [place] A CALL AT [phone].   THE PATIENT IS ENCOURAGED TO PRACTICE SOCIAL DISTANCING DUE TO THE COVID-19 PANDEMIC.

## 2023-04-28 ENCOUNTER — Ambulatory Visit: Admitting: Internal Medicine

## 2023-04-28 ENCOUNTER — Other Ambulatory Visit (HOSPITAL_COMMUNITY): Payer: Self-pay

## 2023-04-28 ENCOUNTER — Encounter: Payer: Self-pay | Admitting: Internal Medicine

## 2023-04-28 VITALS — BP 110/78 | HR 78 | Temp 98.3°F | Ht 61.0 in | Wt 178.2 lb

## 2023-04-28 DIAGNOSIS — E785 Hyperlipidemia, unspecified: Secondary | ICD-10-CM | POA: Diagnosis not present

## 2023-04-28 DIAGNOSIS — I119 Hypertensive heart disease without heart failure: Secondary | ICD-10-CM

## 2023-04-28 DIAGNOSIS — M1711 Unilateral primary osteoarthritis, right knee: Secondary | ICD-10-CM | POA: Diagnosis not present

## 2023-04-28 DIAGNOSIS — I700 Atherosclerosis of aorta: Secondary | ICD-10-CM | POA: Diagnosis not present

## 2023-04-28 DIAGNOSIS — E1169 Type 2 diabetes mellitus with other specified complication: Secondary | ICD-10-CM

## 2023-04-28 DIAGNOSIS — E6609 Other obesity due to excess calories: Secondary | ICD-10-CM | POA: Diagnosis not present

## 2023-04-28 DIAGNOSIS — Z6833 Body mass index (BMI) 33.0-33.9, adult: Secondary | ICD-10-CM

## 2023-04-28 DIAGNOSIS — M791 Myalgia, unspecified site: Secondary | ICD-10-CM | POA: Diagnosis not present

## 2023-04-28 DIAGNOSIS — E66811 Obesity, class 1: Secondary | ICD-10-CM

## 2023-04-28 DIAGNOSIS — D508 Other iron deficiency anemias: Secondary | ICD-10-CM | POA: Diagnosis not present

## 2023-04-28 MED ORDER — ROSUVASTATIN CALCIUM 20 MG PO TABS
20.0000 mg | ORAL_TABLET | Freq: Every day | ORAL | 3 refills | Status: AC
  Filled 2023-04-28 – 2023-05-04 (×2): qty 90, 90d supply, fill #0
  Filled 2023-05-05: qty 30, 30d supply, fill #0
  Filled 2023-05-24 – 2023-06-01 (×3): qty 30, 30d supply, fill #1
  Filled 2023-07-22: qty 30, 30d supply, fill #2
  Filled 2023-08-24: qty 30, 30d supply, fill #3
  Filled 2023-09-23: qty 30, 30d supply, fill #4
  Filled 2023-10-21 – 2023-10-25 (×2): qty 30, 30d supply, fill #5
  Filled 2023-11-29 (×2): qty 30, 30d supply, fill #6
  Filled 2023-12-28: qty 30, 30d supply, fill #7
  Filled 2024-01-26: qty 100, 100d supply, fill #8
  Filled 2024-01-26: qty 30, 30d supply, fill #8
  Filled 2024-02-25: qty 30, 30d supply, fill #9

## 2023-04-28 MED ORDER — ISOSORBIDE MONONITRATE ER 30 MG PO TB24
30.0000 mg | ORAL_TABLET | Freq: Every day | ORAL | 3 refills | Status: AC
  Filled 2023-04-28 – 2023-05-04 (×2): qty 90, 90d supply, fill #0
  Filled 2023-05-05: qty 30, 30d supply, fill #0
  Filled 2023-05-24 – 2023-06-01 (×3): qty 30, 30d supply, fill #1
  Filled 2023-07-22: qty 30, 30d supply, fill #2
  Filled 2023-08-24: qty 30, 30d supply, fill #3
  Filled 2023-09-23: qty 30, 30d supply, fill #4
  Filled 2023-10-21 – 2023-10-25 (×2): qty 30, 30d supply, fill #5
  Filled 2023-11-29 (×2): qty 30, 30d supply, fill #6
  Filled 2023-12-28: qty 30, 30d supply, fill #7
  Filled 2024-01-26: qty 30, 30d supply, fill #8
  Filled 2024-02-25: qty 30, 30d supply, fill #9

## 2023-04-28 MED ORDER — CARVEDILOL 6.25 MG PO TABS
6.2500 mg | ORAL_TABLET | Freq: Two times a day (BID) | ORAL | 3 refills | Status: AC
  Filled 2023-04-28 – 2023-05-04 (×2): qty 180, 90d supply, fill #0
  Filled 2023-05-05: qty 60, 30d supply, fill #0
  Filled 2023-05-24 – 2023-06-01 (×3): qty 60, 30d supply, fill #1
  Filled 2023-07-22: qty 60, 30d supply, fill #2
  Filled 2023-08-24: qty 60, 30d supply, fill #3
  Filled 2023-09-23: qty 60, 30d supply, fill #4
  Filled 2023-10-21 – 2023-10-25 (×2): qty 60, 30d supply, fill #5
  Filled 2023-11-29 (×2): qty 60, 30d supply, fill #6
  Filled 2023-12-28: qty 60, 30d supply, fill #7
  Filled 2024-01-26: qty 60, 30d supply, fill #8
  Filled 2024-02-25: qty 60, 30d supply, fill #9

## 2023-04-28 MED ORDER — AMLODIPINE BESYLATE 5 MG PO TABS
5.0000 mg | ORAL_TABLET | Freq: Every evening | ORAL | 1 refills | Status: DC
  Filled 2023-04-28 – 2023-05-04 (×2): qty 90, 90d supply, fill #0
  Filled 2023-05-05: qty 30, 30d supply, fill #0
  Filled 2023-05-24 – 2023-06-01 (×3): qty 30, 30d supply, fill #1
  Filled 2023-07-22: qty 30, 30d supply, fill #2
  Filled 2023-08-24: qty 30, 30d supply, fill #3

## 2023-04-28 MED ORDER — DICLOFENAC SODIUM 1 % EX GEL: 2.0000 g | Freq: Four times a day (QID) | CUTANEOUS | 0 refills | Status: AC

## 2023-04-28 NOTE — Patient Instructions (Signed)
 Hypertension, Adult Hypertension is another name for high blood pressure. High blood pressure forces your heart to work harder to pump blood. This can cause problems over time. There are two numbers in a blood pressure reading. There is a top number (systolic) over a bottom number (diastolic). It is best to have a blood pressure that is below 120/80. What are the causes? The cause of this condition is not known. Some other conditions can lead to high blood pressure. What increases the risk? Some lifestyle factors can make you more likely to develop high blood pressure: Smoking. Not getting enough exercise or physical activity. Being overweight. Having too much fat, sugar, calories, or salt (sodium) in your diet. Drinking too much alcohol. Other risk factors include: Having any of these conditions: Heart disease. Diabetes. High cholesterol. Kidney disease. Obstructive sleep apnea. Having a family history of high blood pressure and high cholesterol. Age. The risk increases with age. Stress. What are the signs or symptoms? High blood pressure may not cause symptoms. Very high blood pressure (hypertensive crisis) may cause: Headache. Fast or uneven heartbeats (palpitations). Shortness of breath. Nosebleed. Vomiting or feeling like you may vomit (nauseous). Changes in how you see. Very bad chest pain. Feeling dizzy. Seizures. How is this treated? This condition is treated by making healthy lifestyle changes, such as: Eating healthy foods. Exercising more. Drinking less alcohol. Your doctor may prescribe medicine if lifestyle changes do not help enough and if: Your top number is above 130. Your bottom number is above 80. Your personal target blood pressure may vary. Follow these instructions at home: Eating and drinking  If told, follow the DASH eating plan. To follow this plan: Fill one half of your plate at each meal with fruits and vegetables. Fill one fourth of your plate  at each meal with whole grains. Whole grains include whole-wheat pasta, brown rice, and whole-grain bread. Eat or drink low-fat dairy products, such as skim milk or low-fat yogurt. Fill one fourth of your plate at each meal with low-fat (lean) proteins. Low-fat proteins include fish, chicken without skin, eggs, beans, and tofu. Avoid fatty meat, cured and processed meat, or chicken with skin. Avoid pre-made or processed food. Limit the amount of salt in your diet to less than 1,500 mg each day. Do not drink alcohol if: Your doctor tells you not to drink. You are pregnant, may be pregnant, or are planning to become pregnant. If you drink alcohol: Limit how much you have to: 0-1 drink a day for women. 0-2 drinks a day for men. Know how much alcohol is in your drink. In the [place] one drink equals one 12 oz bottle of beer (355 mL), one 5 oz glass of wine (148 mL), or one 1 oz glass of hard liquor (44 mL). Lifestyle  Work with your doctor to stay at a healthy weight or to lose weight. Ask your doctor what the best weight is for you. Get at least 30 minutes of exercise that causes your heart to beat faster (aerobic exercise) most days of the week. This may include walking, swimming, or biking. Get at least 30 minutes of exercise that strengthens your muscles (resistance exercise) at least 3 days a week. This may include lifting weights or doing Pilates. Do not smoke or use any products that contain nicotine or tobacco. If you need help quitting, ask your doctor. Check your blood pressure at home as told by your doctor. Keep all follow-up visits. Medicines Take over-the-counter and prescription medicines  only as told by your doctor. Follow directions carefully. Do not skip doses of blood pressure medicine. The medicine does not work as well if you skip doses. Skipping doses also puts you at risk for problems. Ask your doctor about side effects or reactions to medicines that you should watch  for. Contact a doctor if: You think you are having a reaction to the medicine you are taking. You have headaches that keep coming back. You feel dizzy. You have swelling in your ankles. You have trouble with your vision. Get help right away if: You get a very bad headache. You start to feel mixed up (confused). You feel weak or numb. You feel faint. You have very bad pain in your: Chest. Belly (abdomen). You vomit more than once. You have trouble breathing. These symptoms may be an emergency. Get help right away. Call 911. Do not wait to see if the symptoms will go away. Do not drive yourself to the hospital. Summary Hypertension is another name for high blood pressure. High blood pressure forces your heart to work harder to pump blood. For most people, a normal blood pressure is less than 120/80. Making healthy choices can help lower blood pressure. If your blood pressure does not get lower with healthy choices, you may need to take medicine. This information is not intended to replace advice given to you by your health care provider. Make sure you discuss any questions you have with your health care provider. Document Revised: 10/24/2020 Document Reviewed: 10/24/2020 Elsevier Patient Education  2024 [employer].

## 2023-04-28 NOTE — Assessment & Plan Note (Signed)
Chronic, LDL goal is less than 70.  She will continue with rosuvastatin 20mg  daily. Encouraged to follow heart healthy lifestyle.

## 2023-04-28 NOTE — Assessment & Plan Note (Signed)
 Chronic, well controlled. She will continue with amlodipine, carvedilol and valsartan daily. Encouraged to follow low sodium diet.

## 2023-04-28 NOTE — Assessment & Plan Note (Signed)
 She is encouraged to strive for BMI less than 30 to decrease cardiac risk. Advised to aim for at least 150 minutes of exercise per week.

## 2023-04-28 NOTE — Assessment & Plan Note (Signed)
 Blood glucose levels monitored. Morning glucose 142 mg/dL, pre-visit 98 mg/dL. On Ozempic weekly. - Continue Ozempic 1mg  once a week. - Schedule next diabetes check in August. - LDL goal is less than 70 - Continue with rosuvastatin 20mg  daily

## 2023-04-28 NOTE — Assessment & Plan Note (Signed)
 Blood pressure well-controlled with amlodipine and carvedilol. - Refill amlodipine 5 mg daily. - Refill carvedilol 6.25 mg twice daily.

## 2023-04-28 NOTE — Assessment & Plan Note (Signed)
 I will check repeat CBC and iron studies today. She is feeling better with iron supplementation, she reports compliance.

## 2023-04-28 NOTE — Assessment & Plan Note (Addendum)
 Significant right knee pain limits mobility. Acetaminophen inadequate. Discussed Voltaren gel as topical treatment. Advised against oral diclofenac due to nephrotoxicity. - Prescribe Voltaren gel for topical application to the knee 3-4 times daily. - Advise application to front, sides, and back of the knee. - Consider orthopedic follow-up if symptoms do not improve with topical treatment.

## 2023-04-28 NOTE — Progress Notes (Signed)
 I,Victoria T Deloria Lair, CMA,acting as a Neurosurgeon for Gwynneth Aliment, MD.,have documented all relevant documentation on the behalf of Gwynneth Aliment, MD,as directed by  Gwynneth Aliment, MD while in the presence of Gwynneth Aliment, MD.  Subjective:  Patient ID: Whitney Sanchez , female    DOB: [birth year] , 68 y.o.   MRN: [id number]  Chief Complaint  Patient presents with   Hypertension    Patient presents today for bp & dm follow up. She reports compliance with medications. Denies headache, chest pain & sob. She would like something prescribed to help with knee pain. At home she uses tylenol arthritis & pain. She states this does not help.   She has DM eye exam scheduled for May.   Diabetes    HPI  Discussed the use of AI scribe software for clinical note transcription with the patient, who gave verbal consent to proceed.  History of Present Illness Whitney Sanchez is a 69 year old female with diabetes and hypertension who presents for management of her conditions.  She manages her diabetes with Ozempic once a week. Her morning blood glucose levels are typically around 140 mg/dL, with a recent reading of 142 mg/dL. She frequently checks her blood glucose, especially when elevated, and notes a decrease to 98 mg/dL later in the day.  Her hypertension is managed with amlodipine 5 mg daily and carvedilol 6.25 mg twice daily. She has not reported any issues with these medications.  She experiences significant knee pain, primarily in her right knee, attributed to arthritis. The pain is severe, impacts her ability to walk, and worsens with activity. She uses Tylenol Arthritis for relief but finds it insufficient. She has not used Voltaren gel before and has not taken diclofenac since last year, which she believes helped her left knee at the time.  Muscle aches are present, and she has not seen her orthopedic doctor since last year.      Diabetes She presents for her follow-up diabetic  visit. She has type 2 diabetes mellitus. Her disease course has been stable. Pertinent negatives for hypoglycemia include no dizziness. Pertinent negatives for diabetes include no blurred vision, no polydipsia, no polyphagia and no polyuria. There are no hypoglycemic complications. Risk factors for coronary artery disease include diabetes mellitus, dyslipidemia, hypertension, sedentary lifestyle, post-menopausal and obesity. She participates in exercise intermittently. An ACE inhibitor/angiotensin II receptor blocker is being taken.  Hypertension This is a chronic problem. The current episode started more than 1 year ago. The problem has been gradually improving since onset. The problem is uncontrolled. Pertinent negatives include no blurred vision. Risk factors for coronary artery disease include dyslipidemia and diabetes mellitus. The current treatment provides moderate improvement.     Past Medical History:  Diagnosis Date   Anemia    Arthritis    Depression    DM (diabetes mellitus) (HCC)    High cholesterol    HTN (hypertension)    IBS (irritable bowel syndrome)      Family History  Problem Relation Age of Onset   Diabetes Mother    Healthy Father    Breast cancer Neg Hx    Neuropathy Neg Hx    Colon cancer Neg Hx    Esophageal cancer Neg Hx    Rectal cancer Neg Hx    Stomach cancer Neg Hx      Current Outpatient Medications:    aspirin EC 81 MG tablet, Take 1 tablet (81 mg total) by mouth daily. Swallow  whole., Disp: 90 tablet, Rfl: 1   blood glucose meter kit and supplies KIT, Dispense based on patient and insurance preference. Use up to four times daily as directed. Dx code e11.65, Disp: 1 each, Rfl: 4   diclofenac Sodium (VOLTAREN) 1 % GEL, Apply 2 g topically 4 (four) times daily. As needed, Disp: 100 g, Rfl: 0   DULoxetine (CYMBALTA) 30 MG capsule, Take 1 capsule (30 mg total) by mouth daily., Disp: 90 capsule, Rfl: 1   FERROUS SULFATE PO, Take 65 mg by mouth daily.,  Disp: , Rfl:    Semaglutide, 1 MG/DOSE, (OZEMPIC, 1 MG/DOSE,) 4 MG/3ML SOPN, Inject 1 mg into the skin once a week. Please bill to the free trial coupon, Disp: 3 mL, Rfl: 1   amLODipine (NORVASC) 5 MG tablet, Take 1 tablet (5 mg total) by mouth at bedtime., Disp: 90 tablet, Rfl: 1   carvedilol (COREG) 6.25 MG tablet, Take 1 tablet (6.25 mg total) by mouth 2 (two) times daily with a meal., Disp: 180 tablet, Rfl: 3   isosorbide mononitrate (IMDUR) 30 MG 24 hr tablet, Take 1 tablet (30 mg total) by mouth daily., Disp: 90 tablet, Rfl: 3   nitroGLYCERIN (NITROSTAT) 0.4 MG SL tablet, Place 1 tablet (0.4 mg total) under the tongue every 5 (five) minutes x 3 doses as needed for chest pain. If no relief after 3 doses, call 911. (Patient not taking: Reported on 04/02/2023), Disp: 25 tablet, Rfl: 3   rosuvastatin (CRESTOR) 20 MG tablet, Take 1 tablet (20 mg total) by mouth daily., Disp: 90 tablet, Rfl: 3   valsartan (DIOVAN) 160 MG tablet, Take 1 tablet (160 mg total) by mouth daily., Disp: 90 tablet, Rfl: 2   No Known Allergies   Review of Systems  Constitutional: Negative.   Eyes:  Negative for blurred vision.  Respiratory: Negative.    Cardiovascular: Negative.   Gastrointestinal: Negative.   Endocrine: Negative for polydipsia, polyphagia and polyuria.  Musculoskeletal:  Positive for arthralgias and myalgias.  Neurological: Negative.  Negative for dizziness.  Psychiatric/Behavioral: Negative.       Today's Vitals   04/28/23 1444  BP: 110/78  Pulse: 78  Temp: 98.3 F (36.8 C)  SpO2: 98%  Weight: 178 lb 3.2 oz (80.8 kg)  Height: 5\' 1"  (1.549 m)   Body mass index is 33.67 kg/m.  Wt Readings from Last 3 Encounters:  04/28/23 178 lb 3.2 oz (80.8 kg)  04/23/23 175 lb (79.4 kg)  04/02/23 175 lb 6.4 oz (79.6 kg)     Objective:  Physical Exam Vitals and nursing note reviewed.  Constitutional:      Appearance: Normal appearance. She is obese.  HENT:     Head: Normocephalic and atraumatic.   Eyes:     Extraocular Movements: Extraocular movements intact.  Cardiovascular:     Rate and Rhythm: Normal rate and regular rhythm.     Heart sounds: Normal heart sounds.  Pulmonary:     Effort: Pulmonary effort is normal.     Breath sounds: Normal breath sounds.  Musculoskeletal:     Cervical back: Normal range of motion.  Skin:    General: Skin is warm.  Neurological:     General: No focal deficit present.     Mental Status: She is alert.  Psychiatric:        Mood and Affect: Mood normal.        Behavior: Behavior normal.         Assessment And Plan:  Hypertensive  heart disease without heart failure Assessment & Plan: Chronic, well controlled. She will continue with amlodipine, carvedilol and valsartan daily. Encouraged to follow low sodium diet.   Orders: -     CMP14+EGFR -     CBC  Aortic atherosclerosis (HCC) Assessment & Plan: Chronic, LDL goal is less than 70.  She will continue with rosuvastatin 20mg  daily. Encouraged to follow heart healthy lifestyle.    Dyslipidemia associated with type 2 diabetes mellitus (HCC) Assessment & Plan: Blood glucose levels monitored. Morning glucose 142 mg/dL, pre-visit 98 mg/dL. On Ozempic weekly. - Continue Ozempic 1mg  once a week. - Schedule next diabetes check in August. - LDL goal is less than 70 - Continue with rosuvastatin 20mg  daily  Orders: -     CMP14+EGFR -     Hemoglobin A1c  Myalgia -     CK  Other iron deficiency anemia Assessment & Plan: I will check repeat CBC and iron studies today. She is feeling better with iron supplementation, she reports compliance.   Orders: -     CBC  Primary osteoarthritis of right knee Assessment & Plan: Significant right knee pain limits mobility. Acetaminophen inadequate. Discussed Voltaren gel as topical treatment. Advised against oral diclofenac due to nephrotoxicity. - Prescribe Voltaren gel for topical application to the knee 3-4 times daily. - Advise application to  front, sides, and back of the knee. - Consider orthopedic follow-up if symptoms do not improve with topical treatment.   Class 1 obesity due to excess calories with serious comorbidity and body mass index (BMI) of 33.0 to 33.9 in adult Assessment & Plan: She is encouraged to strive for BMI less than 30 to decrease cardiac risk. Advised to aim for at least 150 minutes of exercise per week.     Other orders -     amLODIPine Besylate; Take 1 tablet (5 mg total) by mouth at bedtime.  Dispense: 90 tablet; Refill: 1 -     Carvedilol; Take 1 tablet (6.25 mg total) by mouth 2 (two) times daily with a meal.  Dispense: 180 tablet; Refill: 3 -     Isosorbide Mononitrate ER; Take 1 tablet (30 mg total) by mouth daily.  Dispense: 90 tablet; Refill: 3 -     Rosuvastatin Calcium; Take 1 tablet (20 mg total) by mouth daily.  Dispense: 90 tablet; Refill: 3 -     Diclofenac Sodium; Apply 2 g topically 4 (four) times daily. As needed  Dispense: 100 g; Refill: 0   Return for 4 month bp & dm .  Patient was given opportunity to ask questions. Patient verbalized understanding of the plan and was able to repeat key elements of the plan. All questions were answered to their satisfaction.    I, Gwynneth Aliment, MD, have reviewed all documentation for this visit. The documentation on 04/28/23 for the exam, diagnosis, procedures, and orders are all accurate and complete.   IF YOU HAVE BEEN REFERRED TO A SPECIALIST, IT MAY TAKE 1-2 WEEKS TO SCHEDULE/PROCESS THE REFERRAL. IF YOU HAVE NOT HEARD FROM US/SPECIALIST IN TWO WEEKS, PLEASE GIVE [place] A CALL AT [phone].   THE PATIENT IS ENCOURAGED TO PRACTICE SOCIAL DISTANCING DUE TO THE COVID-19 PANDEMIC.

## 2023-04-29 LAB — CMP14+EGFR
ALT: 14 IU/L (ref 0–32)
AST: 16 IU/L (ref 0–40)
Albumin: 4.4 g/dL (ref 3.9–4.9)
Alkaline Phosphatase: 74 IU/L (ref 44–121)
BUN/Creatinine Ratio: 12 (ref 12–28)
BUN: 10 mg/dL (ref 8–27)
Bilirubin Total: 0.4 mg/dL (ref 0.0–1.2)
CO2: 23 mmol/L (ref 20–29)
Calcium: 9.7 mg/dL (ref 8.7–10.3)
Chloride: 100 mmol/L (ref 96–106)
Creatinine, Ser: 0.83 mg/dL (ref 0.57–1.00)
Globulin, Total: 2.8 g/dL (ref 1.5–4.5)
Glucose: 74 mg/dL (ref 70–99)
Potassium: 4.3 mmol/L (ref 3.5–5.2)
Sodium: 138 mmol/L (ref 134–144)
Total Protein: 7.2 g/dL (ref 6.0–8.5)
eGFR: 76 mL/min/{1.73_m2} (ref >59)

## 2023-04-29 LAB — CBC
Hematocrit: 37.7 % (ref 34.0–46.6)
Hemoglobin: 12.1 g/dL (ref 11.1–15.9)
MCH: 28.9 pg (ref 26.6–33.0)
MCHC: 32.1 g/dL (ref 31.5–35.7)
MCV: 90 fL (ref 79–97)
Platelets: 277 10*3/uL (ref 150–450)
RBC: 4.18 x10E6/uL (ref 3.77–5.28)
RDW: 13.1 % (ref 11.7–15.4)
WBC: 6 10*3/uL (ref 3.4–10.8)

## 2023-04-29 LAB — HEMOGLOBIN A1C
Est. average glucose Bld gHb Est-mCnc: 117 mg/dL
Hgb A1c MFr Bld: 5.7 % — ABNORMAL HIGH (ref 4.8–5.6)

## 2023-04-29 LAB — CK: Total CK: 126 U/L (ref 32–182)

## 2023-05-04 ENCOUNTER — Other Ambulatory Visit: Payer: Self-pay

## 2023-05-05 ENCOUNTER — Other Ambulatory Visit: Payer: Self-pay

## 2023-05-05 ENCOUNTER — Other Ambulatory Visit (HOSPITAL_COMMUNITY): Payer: Self-pay

## 2023-05-07 ENCOUNTER — Other Ambulatory Visit: Payer: Self-pay

## 2023-05-12 ENCOUNTER — Ambulatory Visit (AMBULATORY_SURGERY_CENTER)

## 2023-05-12 VITALS — Ht 61.0 in | Wt 178.0 lb

## 2023-05-12 DIAGNOSIS — Z1211 Encounter for screening for malignant neoplasm of colon: Secondary | ICD-10-CM

## 2023-05-12 MED ORDER — ONDANSETRON HCL 4 MG PO TABS: 4.0000 mg | ORAL_TABLET | ORAL | 0 refills | Status: AC

## 2023-05-12 MED ORDER — NA SULFATE-K SULFATE-MG SULF 17.5-3.13-1.6 GM/177ML PO SOLN: 1.0000 | Freq: Once | ORAL | 0 refills | Status: AC

## 2023-05-12 NOTE — Progress Notes (Signed)

## 2023-05-24 ENCOUNTER — Other Ambulatory Visit: Payer: Self-pay

## 2023-05-26 ENCOUNTER — Encounter: Payer: Self-pay | Admitting: Gastroenterology

## 2023-05-26 ENCOUNTER — Ambulatory Visit: Admitting: Gastroenterology

## 2023-05-26 VITALS — BP 108/65 | HR 69 | Temp 97.9°F | Resp 17 | Ht 61.0 in | Wt 178.0 lb

## 2023-05-26 DIAGNOSIS — K5730 Diverticulosis of large intestine without perforation or abscess without bleeding: Secondary | ICD-10-CM

## 2023-05-26 DIAGNOSIS — F419 Anxiety disorder, unspecified: Secondary | ICD-10-CM | POA: Diagnosis not present

## 2023-05-26 DIAGNOSIS — Z1211 Encounter for screening for malignant neoplasm of colon: Secondary | ICD-10-CM

## 2023-05-26 DIAGNOSIS — K642 Third degree hemorrhoids: Secondary | ICD-10-CM

## 2023-05-26 DIAGNOSIS — K644 Residual hemorrhoidal skin tags: Secondary | ICD-10-CM | POA: Diagnosis not present

## 2023-05-26 DIAGNOSIS — K635 Polyp of colon: Secondary | ICD-10-CM | POA: Diagnosis not present

## 2023-05-26 DIAGNOSIS — E7800 Pure hypercholesterolemia, unspecified: Secondary | ICD-10-CM | POA: Diagnosis not present

## 2023-05-26 DIAGNOSIS — F32A Depression, unspecified: Secondary | ICD-10-CM | POA: Diagnosis not present

## 2023-05-26 DIAGNOSIS — D123 Benign neoplasm of transverse colon: Secondary | ICD-10-CM

## 2023-05-26 DIAGNOSIS — E119 Type 2 diabetes mellitus without complications: Secondary | ICD-10-CM | POA: Diagnosis not present

## 2023-05-26 DIAGNOSIS — I10 Essential (primary) hypertension: Secondary | ICD-10-CM | POA: Diagnosis not present

## 2023-05-26 MED ORDER — SODIUM CHLORIDE 0.9 % IV SOLN: 500.0000 mL | Freq: Once | INTRAVENOUS | Status: AC

## 2023-05-26 MED ORDER — HYDROCORTISONE ACETATE 25 MG RE SUPP: 25.0000 mg | Freq: Every day | RECTAL | 1 refills | Status: AC

## 2023-05-26 NOTE — Progress Notes (Signed)
 Called to room to assist during endoscopic procedure.  Patient ID and intended procedure confirmed with present staff. Received instructions for my participation in the procedure from the performing physician.

## 2023-05-26 NOTE — Op Note (Signed)
 [facility] Patient Name: Whitney Sanchez Procedure Date: 05/26/2023 1:31 PM MRN: [id number] Endoscopist: Sergio Dandy , MD, [id number] Age: 68 Referring MD:  Date of Birth: [birth year] Gender: Female Account #: [id number] Procedure:                Colonoscopy Indications:              Screening for colorectal malignant neoplasm Medicines:                Monitored Anesthesia Care Procedure:                Pre-Anesthesia Assessment:                           - Prior to the procedure, a History and Physical                            was performed, and patient medications and                            allergies were reviewed. The patient's tolerance of                            previous anesthesia was also reviewed. The risks                            and benefits of the procedure and the sedation                            options and risks were discussed with the patient.                            All questions were answered, and informed consent                            was obtained. Prior Anticoagulants: The patient has                            taken no anticoagulant or antiplatelet agents. ASA                            Grade Assessment: II - A patient with mild systemic                            disease. After reviewing the risks and benefits,                            the patient was deemed in satisfactory condition to                            undergo the procedure.                           After obtaining informed consent, the colonoscope  was passed under direct vision. Throughout the                            procedure, the patient's blood pressure, pulse, and                            oxygen saturations were monitored continuously. The                            Olympus Scope [id number] was introduced through the                            anus and advanced to the the cecum, identified by                             appendiceal orifice and ileocecal valve. The                            colonoscopy was performed without difficulty. The                            patient tolerated the procedure well. The quality                            of the bowel preparation was good. The ileocecal                            valve, appendiceal orifice, and rectum were                            photographed. Scope In: 1:36:21 PM Scope Out: 1:57:07 PM Scope Withdrawal Time: 0 hours 9 minutes 17 seconds  Total Procedure Duration: 0 hours 20 minutes 46 seconds  Findings:                 Hemorrhoids were found on perianal exam.                           A 5 mm polyp was found in the transverse colon. The                            polyp was sessile. The polyp was removed with a                            cold snare. Resection and retrieval were complete.                           Scattered large-mouthed, medium-mouthed and                            small-mouthed diverticula were found in the sigmoid                            colon, descending colon, transverse colon and  ascending colon.                           Non-bleeding external and internal hemorrhoids were                            found during retroflexion and during perianal exam.                            The hemorrhoids were large and Grade III (internal                            hemorrhoids that prolapse but require manual                            reduction). Complications:            No immediate complications. Estimated Blood Loss:     Estimated blood loss was minimal. Impression:               - Hemorrhoids found on perianal exam.                           - One 5 mm polyp in the transverse colon, removed                            with a cold snare. Resected and retrieved.                           - Moderate diverticulosis in the sigmoid colon, in                            the descending colon, in the transverse  colon and                            in the ascending colon.                           - Non-bleeding external and internal hemorrhoids. Recommendation:           - Patient has a contact number available for                            emergencies. The signs and symptoms of potential                            delayed complications were discussed with the                            patient. Return to normal activities tomorrow.                            Written discharge instructions were provided to the                            patient.                           -  Resume previous diet.                           - Continue present medications.                           - Await pathology results.                           - Repeat colonoscopy in 5-10 years for surveillance                            based on pathology results.                           - Refer to a colo-rectal surgeon at appointment to                            be scheduled for management of prolapsed                            hemorrhoids.                           - Use hydrocortisone  suppository 25 mg 1 per rectum                            once a day. Whitney Sanchez V. Analy Bassford, MD 05/26/2023 2:09:57 PM This report has been signed electronically.

## 2023-05-26 NOTE — Progress Notes (Unsigned)
 Report given to PACU, vss

## 2023-05-26 NOTE — Progress Notes (Unsigned)
 Pt's states no medical or surgical changes since previsit or office visit.

## 2023-05-26 NOTE — Progress Notes (Unsigned)
 [facility] History and Physical   Primary Care Physician:  Cleave Curling, MD   Reason for Procedure:  Colorectal cancer screening  Plan:    Screening colonoscopy with possible interventions as needed     HPI: Whitney Sanchez is a very pleasant 68 y.o. female here for screening colonoscopy. Denies any nausea, vomiting, abdominal pain, melena or bright red blood per rectum  The risks and benefits as well as alternatives of endoscopic procedure(s) have been discussed and reviewed. All questions answered. The patient agrees to proceed.    Past Medical History:  Diagnosis Date   Anemia    Anxiety    Arthritis    Depression    DM (diabetes mellitus) (HCC)    High cholesterol    HTN (hypertension)    IBS (irritable bowel syndrome)     Past Surgical History:  Procedure Laterality Date   ABDOMINAL HYSTERECTOMY  1996   total    Prior to Admission medications   Medication Sig Start Date End Date Taking? Authorizing Provider  amLODipine  (NORVASC ) 5 MG tablet Take 1 tablet (5 mg total) by mouth at bedtime. 04/28/23 04/27/24 Yes Cleave Curling, MD  aspirin  EC 81 MG tablet Take 1 tablet (81 mg total) by mouth daily. Swallow whole. 07/17/21  Yes Cleave Curling, MD  blood glucose meter kit and supplies KIT Dispense based on patient and insurance preference. Use up to four times daily as directed. Dx code e11.65 04/29/21  Yes Cleave Curling, MD  carvedilol  (COREG ) 6.25 MG tablet Take 1 tablet (6.25 mg total) by mouth 2 (two) times daily with a meal. 04/28/23  Yes Cleave Curling, MD  diclofenac  Sodium (VOLTAREN ) 1 % GEL Apply 2 g topically 4 (four) times daily. As needed 04/28/23  Yes Cleave Curling, MD  DULoxetine  (CYMBALTA ) 30 MG capsule Take 1 capsule (30 mg total) by mouth daily. 01/18/23  Yes Cleave Curling, MD  isosorbide  mononitrate (IMDUR ) 30 MG 24 hr tablet Take 1 tablet (30 mg total) by mouth daily. 04/28/23  Yes Cleave Curling, MD  ondansetron  (ZOFRAN ) 4 MG tablet Take 1  tablet (4 mg total) by mouth as directed. Take one Zofran  4 mg tablet 30-60 minutes before each colonoscopy prep dose 05/12/23  Yes Floriene Jeschke V, MD  rosuvastatin  (CRESTOR ) 20 MG tablet Take 1 tablet (20 mg total) by mouth daily. 04/28/23  Yes Cleave Curling, MD  Semaglutide , 1 MG/DOSE, (OZEMPIC , 1 MG/DOSE,) 4 MG/3ML SOPN Inject 1 mg into the skin once a week. Please bill to the free trial coupon 03/30/23  Yes Cleave Curling, MD  FERROUS SULFATE PO Take 65 mg by mouth daily. Patient not taking: Reported on 05/26/2023    [provider]  nitroGLYCERIN  (NITROSTAT ) 0.4 MG SL tablet Place 1 tablet (0.4 mg total) under the tongue every 5 (five) minutes x 3 doses as needed for chest pain. If no relief after 3 doses, call 911. 09/23/22   Cleave Curling, MD  valsartan  (DIOVAN ) 160 MG tablet Take 1 tablet (160 mg total) by mouth daily. 12/30/21 04/23/23  Cleave Curling, MD    Current Outpatient Medications  Medication Sig Dispense Refill   amLODipine  (NORVASC ) 5 MG tablet Take 1 tablet (5 mg total) by mouth at bedtime. 90 tablet 1   aspirin  EC 81 MG tablet Take 1 tablet (81 mg total) by mouth daily. Swallow whole. 90 tablet 1   blood glucose meter kit and supplies KIT Dispense based on patient and insurance preference. Use up to four times daily as  directed. Dx code e11.65 1 each 4   carvedilol  (COREG ) 6.25 MG tablet Take 1 tablet (6.25 mg total) by mouth 2 (two) times daily with a meal. 180 tablet 3   diclofenac  Sodium (VOLTAREN ) 1 % GEL Apply 2 g topically 4 (four) times daily. As needed 100 g 0   DULoxetine  (CYMBALTA ) 30 MG capsule Take 1 capsule (30 mg total) by mouth daily. 90 capsule 1   isosorbide  mononitrate (IMDUR ) 30 MG 24 hr tablet Take 1 tablet (30 mg total) by mouth daily. 90 tablet 3   ondansetron  (ZOFRAN ) 4 MG tablet Take 1 tablet (4 mg total) by mouth as directed. Take one Zofran  4 mg tablet 30-60 minutes before each colonoscopy prep dose 6 tablet 0   rosuvastatin  (CRESTOR ) 20 MG  tablet Take 1 tablet (20 mg total) by mouth daily. 90 tablet 3   Semaglutide , 1 MG/DOSE, (OZEMPIC , 1 MG/DOSE,) 4 MG/3ML SOPN Inject 1 mg into the skin once a week. Please bill to the free trial coupon 3 mL 1   FERROUS SULFATE PO Take 65 mg by mouth daily. (Patient not taking: Reported on 05/26/2023)     nitroGLYCERIN  (NITROSTAT ) 0.4 MG SL tablet Place 1 tablet (0.4 mg total) under the tongue every 5 (five) minutes x 3 doses as needed for chest pain. If no relief after 3 doses, call 911. 25 tablet 3   valsartan  (DIOVAN ) 160 MG tablet Take 1 tablet (160 mg total) by mouth daily. 90 tablet 2   Current Facility-Administered Medications  Medication Dose Route Frequency Provider Last Rate Last Admin   0.9 %  sodium chloride  infusion  500 mL Intravenous Once Tinya Cadogan V, MD        Allergies as of 05/26/2023   (No Known Allergies)    Family History  Problem Relation Age of Onset   Diabetes Mother    Healthy Father    Breast cancer Neg Hx    Neuropathy Neg Hx    Colon cancer Neg Hx    Esophageal cancer Neg Hx    Rectal cancer Neg Hx    Stomach cancer Neg Hx    Colon polyps Neg Hx     Social History   Socioeconomic History   Marital status: Married    Spouse name: Not on file   Number of children: 3   Years of education: Not on file   Highest education level: Not on file  Occupational History   Occupation: retired  Tobacco Use   Smoking status: Never    Passive exposure: Never   Smokeless tobacco: Never  Vaping Use   Vaping status: Never Used  Substance and Sexual Activity   Alcohol use: Never   Drug use: Never   Sexual activity: Not on file  Other Topics Concern   Not on file  Social History Narrative   Not on file   Social Drivers of Health   Financial Resource Strain: Low Risk  (03/31/2023)   Overall Financial Resource Strain (CARDIA)    Difficulty of Paying Living Expenses: Not hard at all  Food Insecurity: No Food Insecurity (03/31/2023)   Hunger Vital Sign     Worried About Running Out of Food in the Last Year: Never true    Ran Out of Food in the Last Year: Never true  Transportation Needs: No Transportation Needs (03/31/2023)   PRAPARE - Administrator, Civil Service (Medical): No    Lack of Transportation (Non-Medical): No  Physical Activity: Insufficiently Active (03/31/2023)  Exercise Vital Sign    Days of Exercise per Week: 1 day    Minutes of Exercise per Session: 30 min  Stress: No Stress Concern Present (03/31/2023)   [employer] of Occupational Health - Occupational Stress Questionnaire    Feeling of Stress : Not at all  Social Connections: Moderately Integrated (03/31/2023)   Social Connection and Isolation Panel [NHANES]    Frequency of Communication with Friends and Family: More than three times a week    Frequency of Social Gatherings with Friends and Family: Not on file    Attends Religious Services: More than 4 times per year    Active Member of [employer] or Organizations: No    Attends Banker Meetings: Never    Marital Status: Married  Catering manager Violence: Not At Risk (03/31/2023)   Humiliation, Afraid, Rape, and Kick questionnaire    Fear of Current or Ex-Partner: No    Emotionally Abused: No    Physically Abused: No    Sexually Abused: No    Review of Systems:  All other review of systems negative except as mentioned in the HPI.  Physical Exam: Vital signs in last 24 hours: BP 127/78   Pulse 74   Temp 97.9 F (36.6 C) (Temporal)   Ht 5\' 1"  (1.549 m)   Wt 178 lb (80.7 kg)   SpO2 98%   BMI 33.63 kg/m  General:   Alert, NAD Lungs:  Clear .   Heart:  Regular rate and rhythm Abdomen:  Soft, nontender and nondistended. Neuro/Psych:  Alert and cooperative. Normal mood and affect. A and O x 3  Reviewed labs, radiology imaging, old records and pertinent past GI work up  Patient is appropriate for planned procedure(s) and anesthesia in an ambulatory setting   K. Veena Julieana Eshleman  , MD [phone]

## 2023-05-26 NOTE — Patient Instructions (Addendum)
 YOU HAD AN ENDOSCOPIC PROCEDURE TODAY AT THE [facility]:   Refer to the procedure report that was given to you for any specific questions about what was found during the examination.  If the procedure report does not answer your questions, please call your gastroenterologist to clarify.  If you requested that your care partner not be given the details of your procedure findings, then the procedure report has been included in a sealed envelope for you to review at your convenience later.  YOU SHOULD EXPECT: Some feelings of bloating in the abdomen. Passage of more gas than usual.  Walking can help get rid of the air that was put into your GI tract during the procedure and reduce the bloating. If you had a lower endoscopy (such as a colonoscopy or flexible sigmoidoscopy) you may notice spotting of blood in your stool or on the toilet paper. If you underwent a bowel prep for your procedure, you may not have a normal bowel movement for a few days.  Please Note:  You might notice some irritation and congestion in your nose or some drainage.  This is from the oxygen used during your procedure.  There is no need for concern and it should clear up in a day or so.  SYMPTOMS TO REPORT IMMEDIATELY:  Following lower endoscopy (colonoscopy or flexible sigmoidoscopy):  Excessive amounts of blood in the stool  Significant tenderness or worsening of abdominal pains  Swelling of the abdomen that is new, acute  Fever of 100F or higher   For urgent or emergent issues, a gastroenterologist can be reached at any hour by calling [phone]. Do not use MyChart messaging for urgent concerns.    DIET:  We do recommend a small meal at first, but then you may proceed to your regular diet.  Drink plenty of fluids but you should avoid alcoholic beverages for 24 hours.  MEDICATIONS: Continue present medications. Use hydrocortisone  suppository 25 mg, 1 application to rectum once daily at bedtime.  FOLLOW  UP: Await pathology results. Repeat colonoscopy in 5-10 years for surveillance based on pathology results. Refer to a colo-rectal surgeon at an appointment to be scheduled for management of prolapsed hemorrhoids. Dr. Allean Aran nurse will call you to schedule this appointment.  Please see handouts given to you by your recovery nurse: Polyps, Diverticulosis, Hemorrhoids.  Thank you for allowing us  to provide for your healthcare needs today.  ACTIVITY:  You should plan to take it easy for the rest of today and you should NOT DRIVE or use heavy machinery until tomorrow (because of the sedation medicines used during the test).    FOLLOW UP: Our staff will call the number listed on your records the next business day following your procedure.  We will call around 7:15- 8:00 am to check on you and address any questions or concerns that you may have regarding the information given to you following your procedure. If we do not reach you, we will leave a message.     If any biopsies were taken you will be contacted by phone or by letter within the next 1-3 weeks.  Please call us  at [phone] if you have not heard about the biopsies in 3 weeks.    SIGNATURES/CONFIDENTIALITY: You and/or your care partner have signed paperwork which will be entered into your electronic medical record.  These signatures attest to the fact that that the information above on your After Visit Summary has been reviewed and is understood.  Full  responsibility of the confidentiality of this discharge information lies with you and/or your care-partner.

## 2023-05-27 ENCOUNTER — Telehealth: Payer: Self-pay

## 2023-05-27 NOTE — Telephone Encounter (Signed)
  Follow up Call-     05/26/2023   12:39 PM 04/23/2023   10:26 AM  Call back number  Post procedure Call Back phone  # [phone]  Permission to leave phone message Yes Yes     Patient questions:  Do you have a fever, pain , or abdominal swelling? No. Pain Score  0 *  Have you tolerated food without any problems? Yes.    Have you been able to return to your normal activities? Yes.    Do you have any questions about your discharge instructions: Diet   No. Medications  No. Follow up visit  No.  Do you have questions or concerns about your Care? No.  Actions: * If pain score is 4 or above: No action needed, pain <4.

## 2023-05-31 LAB — SURGICAL PATHOLOGY

## 2023-06-01 ENCOUNTER — Other Ambulatory Visit: Payer: Self-pay

## 2023-06-02 ENCOUNTER — Other Ambulatory Visit: Payer: Self-pay

## 2023-06-02 ENCOUNTER — Telehealth: Payer: Self-pay | Admitting: *Deleted

## 2023-06-02 NOTE — Telephone Encounter (Signed)
 Per Colonoscopy report refer pt to [facility] for the management of prolapsed hemorrhoids   Faxed referral to [facility] First available

## 2023-06-03 ENCOUNTER — Other Ambulatory Visit: Payer: Self-pay

## 2023-06-15 DIAGNOSIS — E119 Type 2 diabetes mellitus without complications: Secondary | ICD-10-CM | POA: Diagnosis not present

## 2023-06-15 LAB — HM DIABETES EYE EXAM

## 2023-06-21 ENCOUNTER — Other Ambulatory Visit: Payer: Self-pay

## 2023-06-21 ENCOUNTER — Other Ambulatory Visit: Payer: Self-pay | Admitting: Internal Medicine

## 2023-06-21 DIAGNOSIS — F419 Anxiety disorder, unspecified: Secondary | ICD-10-CM

## 2023-06-23 ENCOUNTER — Other Ambulatory Visit: Payer: Self-pay

## 2023-06-23 MED ORDER — DULOXETINE HCL 30 MG PO CPEP
30.0000 mg | ORAL_CAPSULE | Freq: Every day | ORAL | 1 refills | Status: DC
  Filled 2023-07-22: qty 30, 30d supply, fill #0
  Filled 2023-08-24: qty 30, 30d supply, fill #1
  Filled 2023-09-23: qty 30, 30d supply, fill #2
  Filled 2023-10-21 – 2023-10-25 (×2): qty 30, 30d supply, fill #3
  Filled 2023-11-29 (×2): qty 30, 30d supply, fill #4
  Filled 2023-12-28: qty 30, 30d supply, fill #5

## 2023-06-28 ENCOUNTER — Ambulatory Visit: Payer: Self-pay | Admitting: Gastroenterology

## 2023-07-13 NOTE — Telephone Encounter (Signed)
 Patient has been scheduled on 07/20/2023 with Dr Sheldon

## 2023-07-20 ENCOUNTER — Ambulatory Visit: Payer: Self-pay | Admitting: Surgery

## 2023-07-20 DIAGNOSIS — K641 Second degree hemorrhoids: Secondary | ICD-10-CM | POA: Diagnosis not present

## 2023-07-20 DIAGNOSIS — K643 Fourth degree hemorrhoids: Secondary | ICD-10-CM | POA: Diagnosis not present

## 2023-07-22 ENCOUNTER — Other Ambulatory Visit: Payer: Self-pay

## 2023-07-29 ENCOUNTER — Other Ambulatory Visit: Payer: Self-pay

## 2023-08-09 ENCOUNTER — Other Ambulatory Visit: Payer: Self-pay

## 2023-08-12 ENCOUNTER — Other Ambulatory Visit: Payer: Self-pay

## 2023-08-24 ENCOUNTER — Other Ambulatory Visit: Payer: Self-pay

## 2023-08-26 ENCOUNTER — Other Ambulatory Visit: Payer: Self-pay

## 2023-08-30 ENCOUNTER — Ambulatory Visit: Admitting: Internal Medicine

## 2023-08-30 ENCOUNTER — Other Ambulatory Visit (HOSPITAL_COMMUNITY): Payer: Self-pay

## 2023-08-30 ENCOUNTER — Encounter: Payer: Self-pay | Admitting: Internal Medicine

## 2023-08-30 VITALS — BP 110/82 | HR 71 | Temp 98.3°F | Ht 61.0 in | Wt 180.4 lb

## 2023-08-30 DIAGNOSIS — M1711 Unilateral primary osteoarthritis, right knee: Secondary | ICD-10-CM | POA: Diagnosis not present

## 2023-08-30 DIAGNOSIS — E6609 Other obesity due to excess calories: Secondary | ICD-10-CM

## 2023-08-30 DIAGNOSIS — Z6834 Body mass index (BMI) 34.0-34.9, adult: Secondary | ICD-10-CM | POA: Diagnosis not present

## 2023-08-30 DIAGNOSIS — E1169 Type 2 diabetes mellitus with other specified complication: Secondary | ICD-10-CM | POA: Diagnosis not present

## 2023-08-30 DIAGNOSIS — I700 Atherosclerosis of aorta: Secondary | ICD-10-CM | POA: Diagnosis not present

## 2023-08-30 DIAGNOSIS — E785 Hyperlipidemia, unspecified: Secondary | ICD-10-CM

## 2023-08-30 DIAGNOSIS — I119 Hypertensive heart disease without heart failure: Secondary | ICD-10-CM | POA: Diagnosis not present

## 2023-08-30 DIAGNOSIS — E66811 Obesity, class 1: Secondary | ICD-10-CM | POA: Diagnosis not present

## 2023-08-30 MED ORDER — AMLODIPINE BESYLATE 5 MG PO TABS
5.0000 mg | ORAL_TABLET | Freq: Every evening | ORAL | 2 refills | Status: AC
  Filled 2023-08-30: qty 90, 90d supply, fill #0
  Filled 2023-09-23: qty 30, 30d supply, fill #0
  Filled 2023-10-21 – 2023-10-25 (×2): qty 30, 30d supply, fill #1
  Filled 2023-11-29 (×2): qty 30, 30d supply, fill #2
  Filled 2023-12-28: qty 30, 30d supply, fill #3
  Filled 2024-01-26: qty 30, 30d supply, fill #4
  Filled 2024-02-25: qty 30, 30d supply, fill #5

## 2023-08-30 NOTE — Assessment & Plan Note (Signed)
 Type 2 diabetes with controlled fasting glucose but elevated postprandial levels. Managed with Ozempic . Dietary indiscretion noted. - Continue Ozempic . - Encourage dietary modifications. - Order A1c test. - Refer to 12-week exercise program at [facility].

## 2023-08-30 NOTE — Assessment & Plan Note (Signed)
Chronic, LDL goal is less than 70.  She will continue with rosuvastatin 20mg  daily. Encouraged to follow heart healthy lifestyle.

## 2023-08-30 NOTE — Patient Instructions (Signed)

## 2023-08-30 NOTE — Assessment & Plan Note (Signed)
 Chronic, well controlled. She will continue with amlodipine , carvedilol  and valsartan  daily. Encouraged to follow low sodium diet.  - Refill amlodipine  90-day supply. - Continue carvedilol  and valsartan .

## 2023-08-30 NOTE — Assessment & Plan Note (Signed)
 She is encouraged to strive for BMI less than 30 to decrease cardiac risk. Advised to aim for at least 150 minutes of exercise per week. - She agrees w/ referral to the PREP program

## 2023-08-30 NOTE — Progress Notes (Signed)
 I,[name], CMA,acting as a Neurosurgeon for [name], MD.,have documented all relevant documentation on the behalf of [name], MD,as directed by  [name], MD while in the presence of [name], MD.  Subjective:  Patient ID: Whitney Sanchez , female    DOB: 08/14/1955 , 68 y.o.   MRN: [id number]  Chief Complaint  Patient presents with   Diabetes    Patient presents today for bp & dm follow up. She reports compliance with medications. Denies headache, chest pain & sob.   Hypertension    HPI Discussed the use of AI scribe software for clinical note transcription with the patient, who gave verbal consent to proceed.  History of Present Illness Whitney Sanchez is a 68 year old female who presents for a diabetes check.  She monitors her blood glucose levels regularly, with typical fasting levels around 97 mg/dL. This morning, her postprandial glucose was 171 mg/dL after consuming two pieces of bacon and one pancake. She is currently on Ozempic  for diabetes management.  She manages her blood pressure with amlodipine . She also takes carvedilol , isosorbide , duloxetine , rosuvastatin  for cholesterol management, and valsartan  for blood pressure.  She has completed several health screenings and procedures, including an eye exam, mammogram, colonoscopy, and is scheduled for a hemorrhoid operation in October.  She has experienced shortness of breath in the past, which has improved, but she still feels tired and acknowledges a lack of exercise. Her diet usually includes vegetables like broccoli, green beans, and cabbage mostly at dinner. She has been trying to drink more water, having reduced her tea intake.   Diabetes She presents for her follow-up diabetic visit. She has type 2 diabetes mellitus. Her disease course has been stable. Pertinent negatives for hypoglycemia include no dizziness. Pertinent negatives for diabetes include no blurred vision, no polydipsia,  no polyphagia and no polyuria. There are no hypoglycemic complications. Risk factors for coronary artery disease include diabetes mellitus, dyslipidemia, hypertension, sedentary lifestyle, post-menopausal and obesity. She participates in exercise intermittently. An ACE inhibitor/angiotensin II receptor blocker is being taken.  Hypertension This is a chronic problem. The current episode started more than 1 year ago. The problem has been gradually improving since onset. The problem is uncontrolled. Pertinent negatives include no blurred vision. Risk factors for coronary artery disease include dyslipidemia and diabetes mellitus. The current treatment provides moderate improvement.     Past Medical History:  Diagnosis Date   Anemia    Anxiety    Arthritis    Depression    DM (diabetes mellitus) (HCC)    High cholesterol    HTN (hypertension)    IBS (irritable bowel syndrome)      Family History  Problem Relation Age of Onset   Diabetes Mother    Healthy Father    Breast cancer Neg Hx    Neuropathy Neg Hx    Colon cancer Neg Hx    Esophageal cancer Neg Hx    Rectal cancer Neg Hx    Stomach cancer Neg Hx    Colon polyps Neg Hx      Current Outpatient Medications:    aspirin  EC 81 MG tablet, Take 1 tablet (81 mg total) by mouth daily. Swallow whole., Disp: 90 tablet, Rfl: 1   blood glucose meter kit and supplies KIT, Dispense based on patient and insurance preference. Use up to four times daily as directed. Dx code e11.65, Disp: 1 each, Rfl: 4   carvedilol  (COREG ) 6.25 MG  tablet, Take 1 tablet (6.25 mg total) by mouth 2 (two) times daily with a meal., Disp: 180 tablet, Rfl: 3   diclofenac  Sodium (VOLTAREN ) 1 % GEL, Apply 2 g topically 4 (four) times daily. As needed, Disp: 100 g, Rfl: 0   DULoxetine  (CYMBALTA ) 30 MG capsule, Take 1 capsule (30 mg total) by mouth daily., Disp: 90 capsule, Rfl: 1   hydrocortisone  (ANUSOL -HC) 25 MG suppository, Place 1 suppository (25 mg total) rectally at  bedtime., Disp: 12 suppository, Rfl: 1   isosorbide  mononitrate (IMDUR ) 30 MG 24 hr tablet, Take 1 tablet (30 mg total) by mouth daily., Disp: 90 tablet, Rfl: 3   nitroGLYCERIN  (NITROSTAT ) 0.4 MG SL tablet, Place 1 tablet (0.4 mg total) under the tongue every 5 (five) minutes x 3 doses as needed for chest pain. If no relief after 3 doses, call 911., Disp: 25 tablet, Rfl: 3   ondansetron  (ZOFRAN ) 4 MG tablet, Take 1 tablet (4 mg total) by mouth as directed. Take one Zofran  4 mg tablet 30-60 minutes before each colonoscopy prep dose, Disp: 6 tablet, Rfl: 0   rosuvastatin  (CRESTOR ) 20 MG tablet, Take 1 tablet (20 mg total) by mouth daily., Disp: 90 tablet, Rfl: 3   Semaglutide , 1 MG/DOSE, (OZEMPIC , 1 MG/DOSE,) 4 MG/3ML SOPN, Inject 1 mg into the skin once a week. Please bill to the free trial coupon, Disp: 3 mL, Rfl: 1   valsartan  (DIOVAN ) 160 MG tablet, Take 1 tablet (160 mg total) by mouth daily., Disp: 90 tablet, Rfl: 2   amLODipine  (NORVASC ) 5 MG tablet, Take 1 tablet (5 mg total) by mouth at bedtime., Disp: 90 tablet, Rfl: 2   FERROUS SULFATE PO, Take 65 mg by mouth daily. (Patient not taking: Reported on 08/30/2023), Disp: , Rfl:   Current Facility-Administered Medications:    0.9 %  sodium chloride  infusion, 500 mL, Intravenous, Once, Nandigam, Kavitha V, MD   No Known Allergies   Review of Systems  Constitutional: Negative.   Eyes:  Negative for blurred vision.  Respiratory: Negative.    Cardiovascular: Negative.   Gastrointestinal: Negative.   Endocrine: Negative for polydipsia, polyphagia and polyuria.  Genitourinary:  Negative for decreased urine volume.  Musculoskeletal:  Positive for arthralgias.       She c/o worsening knee pain. Was seen by Dr. Jerri last year, she has yet to notify Ortho of worsening sx.   Neurological: Negative.  Negative for dizziness.  Psychiatric/Behavioral: Negative.       Today's Vitals   08/30/23 1437  BP: 110/82  Pulse: 71  Temp: 98.3 F (36.8 C)   SpO2: 98%  Weight: 180 lb 6.4 oz (81.8 kg)  Height: 5' 1 (1.549 m)   Body mass index is 34.09 kg/m.  Wt Readings from Last 3 Encounters:  08/30/23 180 lb 6.4 oz (81.8 kg)  05/26/23 178 lb (80.7 kg)  05/12/23 178 lb (80.7 kg)     Objective:  Physical Exam Vitals and nursing note reviewed.  Constitutional:      Appearance: Normal appearance. She is obese.  HENT:     Head: Normocephalic and atraumatic.  Eyes:     Extraocular Movements: Extraocular movements intact.  Cardiovascular:     Rate and Rhythm: Normal rate and regular rhythm.     Heart sounds: Normal heart sounds.  Pulmonary:     Effort: Pulmonary effort is normal.     Breath sounds: Normal breath sounds.  Musculoskeletal:     Cervical back: Normal range of motion.  Skin:    General: Skin is warm.  Neurological:     General: No focal deficit present.     Mental Status: She is alert.  Psychiatric:        Mood and Affect: Mood normal.        Behavior: Behavior normal.         Assessment And Plan:  Dyslipidemia associated with type 2 diabetes mellitus (HCC) Assessment & Plan: Type 2 diabetes with controlled fasting glucose but elevated postprandial levels. Managed with Ozempic . Dietary indiscretion noted. - Continue Ozempic . - Encourage dietary modifications. - Order A1c test. - Refer to 12-week exercise program at [facility].  Orders: -     Hemoglobin A1c -     BMP8+EGFR -     TSH -     Amb Referral To Provider Referral Exercise Program (P.R.E.P)  Hypertensive heart disease without heart failure Assessment & Plan: Chronic, well controlled. She will continue with amlodipine , carvedilol  and valsartan  daily. Encouraged to follow low sodium diet.  - Refill amlodipine  90-day supply. - Continue carvedilol  and valsartan .  Orders: -     BMP8+EGFR -     Amb Referral To Provider Referral Exercise Program (P.R.E.P)  Aortic atherosclerosis (HCC) Assessment & Plan: Chronic, LDL goal is less than 70.  She  will continue with rosuvastatin  20mg  daily. Encouraged to follow heart healthy lifestyle.   Orders: -     Amb Referral To Provider Referral Exercise Program (P.R.E.P)  Primary osteoarthritis of right knee Assessment & Plan: Chronic, last year's Ortho note reviewed.  Will send rx meloxicam  7.5mg  daily to take as needed, advised to take with food. If pain persists, f/u with Ortho for repeat x-rays and possible steroid injection.  - She is agreeable with treatment plan. - She would also benefit from topical Voltaren  gel to affected area tid prn.    Class 1 obesity due to excess calories with serious comorbidity and body mass index (BMI) of 34.0 to 34.9 in adult Assessment & Plan: She is encouraged to strive for BMI less than 30 to decrease cardiac risk. Advised to aim for at least 150 minutes of exercise per week. - She agrees w/ referral to the PREP program   Orders: -     Amb Referral To Provider Referral Exercise Program (P.R.E.P)  Other orders -     amLODIPine  Besylate; Take 1 tablet (5 mg total) by mouth at bedtime.  Dispense: 90 tablet; Refill: 2   Return in 4 weeks (on 09/27/2023), or [place] - flu vaccine.  Patient was given opportunity to ask questions. Patient verbalized understanding of the plan and was able to repeat key elements of the plan. All questions were answered to their satisfaction.   I, [name], MD, have reviewed all documentation for this visit. The documentation on 08/30/23 for the exam, diagnosis, procedures, and orders are all accurate and complete.   IF YOU HAVE BEEN REFERRED TO A SPECIALIST, IT MAY TAKE 1-2 WEEKS TO SCHEDULE/PROCESS THE REFERRAL. IF YOU HAVE NOT HEARD FROM US /SPECIALIST IN TWO WEEKS, PLEASE GIVE US  A CALL AT [phone] X [phone].   THE PATIENT IS ENCOURAGED TO PRACTICE SOCIAL DISTANCING DUE TO THE COVID-19 PANDEMIC.

## 2023-08-30 NOTE — Assessment & Plan Note (Signed)
 Chronic, last year's Ortho note reviewed.  Will send rx meloxicam  7.5mg  daily to take as needed, advised to take with food. If pain persists, f/u with Ortho for repeat x-rays and possible steroid injection.  - She is agreeable with treatment plan. - She would also benefit from topical Voltaren  gel to affected area tid prn.

## 2023-08-31 ENCOUNTER — Other Ambulatory Visit: Payer: Self-pay

## 2023-08-31 ENCOUNTER — Telehealth: Payer: Self-pay | Admitting: Internal Medicine

## 2023-08-31 LAB — BMP8+EGFR
BUN/Creatinine Ratio: 23 (ref 12–28)
BUN: 20 mg/dL (ref 8–27)
CO2: 22 mmol/L (ref 20–29)
Calcium: 9.4 mg/dL (ref 8.7–10.3)
Chloride: 102 mmol/L (ref 96–106)
Creatinine, Ser: 0.88 mg/dL (ref 0.57–1.00)
Glucose: 84 mg/dL (ref 70–99)
Potassium: 4.5 mmol/L (ref 3.5–5.2)
Sodium: 139 mmol/L (ref 134–144)
eGFR: 71 mL/min/1.73 (ref >59)

## 2023-08-31 LAB — HEMOGLOBIN A1C
Est. average glucose Bld gHb Est-mCnc: 108 mg/dL
Hgb A1c MFr Bld: 5.4 % (ref 4.8–5.6)

## 2023-08-31 LAB — TSH: TSH: 0.678 u[IU]/mL (ref 0.450–4.500)

## 2023-08-31 NOTE — Telephone Encounter (Unsigned)
 Copied from CRM [id number]. Topic: Clinical - Prescription Issue >> Aug 31, 2023 11:24 AM Wess RAMAN wrote: Reason for CRM: Per Dr. Jarold' note: Chronic, last year's Ortho note reviewed.  Will send rx meloxicam  7.5mg  daily to take as needed, advised to take with food. Patient stated pharmacy has not received medication yet.  Callback #: [phone]  Pharmacy: [id number] [place], [place] - [address] [place] [address] Phone: [phone] Fax: [phone] Hours: Not open 24 hours

## 2023-09-01 ENCOUNTER — Ambulatory Visit: Payer: Self-pay | Admitting: Internal Medicine

## 2023-09-01 ENCOUNTER — Telehealth: Payer: Self-pay

## 2023-09-01 NOTE — Telephone Encounter (Signed)
 Was calling to provide information for the PREP Program. Call answered by someone who was not Pricila, said she would give Jamilah a message.

## 2023-09-06 ENCOUNTER — Other Ambulatory Visit: Payer: Self-pay

## 2023-09-06 MED ORDER — MELOXICAM 7.5 MG PO TABS: 7.5000 mg | ORAL_TABLET | Freq: Every day | ORAL | 0 refills | Status: DC | PRN

## 2023-09-09 ENCOUNTER — Other Ambulatory Visit (HOSPITAL_COMMUNITY): Payer: Self-pay

## 2023-09-13 ENCOUNTER — Other Ambulatory Visit: Payer: Self-pay

## 2023-09-21 ENCOUNTER — Telehealth: Payer: Self-pay

## 2023-09-21 NOTE — Telephone Encounter (Signed)
 Completed refill/ order form for Ozempic  (Novo Nordisk) and faxed to provider's office for review and signature.

## 2023-09-23 ENCOUNTER — Other Ambulatory Visit: Payer: Self-pay

## 2023-09-24 ENCOUNTER — Other Ambulatory Visit: Payer: Self-pay

## 2023-09-28 ENCOUNTER — Ambulatory Visit (INDEPENDENT_AMBULATORY_CARE_PROVIDER_SITE_OTHER)

## 2023-09-28 ENCOUNTER — Other Ambulatory Visit (HOSPITAL_COMMUNITY): Payer: Self-pay

## 2023-09-28 VITALS — BP 124/82 | HR 75 | Temp 98.7°F | Ht 61.0 in | Wt 180.0 lb

## 2023-09-28 DIAGNOSIS — Z23 Encounter for immunization: Secondary | ICD-10-CM

## 2023-09-28 NOTE — Progress Notes (Signed)
 Patient presents today for a flu vaccine. Patient received injection in her left arm. Patient tolerated injection well.

## 2023-09-29 ENCOUNTER — Other Ambulatory Visit: Payer: Self-pay

## 2023-10-03 ENCOUNTER — Other Ambulatory Visit: Payer: Self-pay | Admitting: Internal Medicine

## 2023-10-08 ENCOUNTER — Telehealth: Payer: Self-pay

## 2023-10-08 NOTE — Telephone Encounter (Signed)
 Called patient to inform their  OZEMPIC  patient assistance  is ready for pick up, patient aware. Patient made aware they have 1 week to pick up patient assistance.

## 2023-10-13 ENCOUNTER — Other Ambulatory Visit: Payer: Self-pay

## 2023-10-21 ENCOUNTER — Other Ambulatory Visit: Payer: Self-pay

## 2023-10-21 ENCOUNTER — Other Ambulatory Visit: Payer: Self-pay | Admitting: Surgery

## 2023-10-21 DIAGNOSIS — K649 Unspecified hemorrhoids: Secondary | ICD-10-CM | POA: Diagnosis not present

## 2023-10-21 DIAGNOSIS — K643 Fourth degree hemorrhoids: Secondary | ICD-10-CM | POA: Diagnosis not present

## 2023-10-22 ENCOUNTER — Other Ambulatory Visit: Payer: Self-pay

## 2023-10-22 LAB — SURGICAL PATHOLOGY

## 2023-10-25 ENCOUNTER — Other Ambulatory Visit: Payer: Self-pay

## 2023-10-27 ENCOUNTER — Other Ambulatory Visit: Payer: Self-pay

## 2023-11-01 ENCOUNTER — Other Ambulatory Visit: Payer: Self-pay | Admitting: Internal Medicine

## 2023-11-10 ENCOUNTER — Telehealth: Payer: Self-pay

## 2023-11-10 NOTE — Telephone Encounter (Signed)
 Left message about PREP program and asked her to return my call if she is interested.

## 2023-11-17 ENCOUNTER — Other Ambulatory Visit: Payer: Self-pay

## 2023-11-29 ENCOUNTER — Other Ambulatory Visit: Payer: Self-pay

## 2023-11-30 ENCOUNTER — Other Ambulatory Visit (HOSPITAL_COMMUNITY): Payer: Self-pay

## 2023-12-01 ENCOUNTER — Other Ambulatory Visit: Payer: Self-pay

## 2023-12-04 ENCOUNTER — Other Ambulatory Visit: Payer: Self-pay

## 2023-12-14 ENCOUNTER — Other Ambulatory Visit: Payer: Self-pay

## 2023-12-15 ENCOUNTER — Other Ambulatory Visit: Payer: Self-pay

## 2023-12-24 ENCOUNTER — Other Ambulatory Visit: Payer: Self-pay

## 2023-12-24 NOTE — Patient Instructions (Addendum)
 Fecal Incontinence Fecal incontinence, also called accidental bowel leakage, is an inability to control your bowels. This means stool (feces) leaks from the rectum. This condition happens because the nerves or muscles around the anus do not work the way they should. The anus is the opening between the buttocks. What are the causes? This condition may be caused by: Damage to the muscles at the end of the rectum (sphincter). Damage to the nerves that control bowel movements. Diarrhea. Chronic constipation. Pelvic floor dysfunction. This means the muscles in the pelvis do not work well. Loss of bowel storage capacity. This occurs when the rectum can no longer stretch in size in order to store feces. Inflammatory bowel disease (IBD), such as Crohn's disease. Irritable bowel syndrome (IBS). What increases the risk? You are more likely to develop this condition if: You were born with bowels or a pelvis that did not form correctly. You have had rectal surgery. You have had radiation treatment for certain cancers. You have been pregnant, had a vaginal delivery, or had surgery that damaged the pelvic floor muscles. You have nerve damage that affects the rectum or anus: You had a complicated childbirth, spinal cord injury, or other trauma that caused nerve damage. You have a condition that can affect nerve function, such as diabetes, Parkinson's disease, or multiple sclerosis (MS). You have a condition where the rectum drops down into the anus or vagina (prolapse). You are 70 years of age or older. What are the signs or symptoms? The main symptom of this condition is not being able to control your bowels. You also might not be able to get to the bathroom before a bowel movement. Other symptoms may include: Diarrhea. Constipation. Discomfort in the abdomen. How is this diagnosed? This condition is diagnosed with a medical history and physical exam. You may also have other tests, including: Blood  and urine tests. A rectal exam. Ultrasound or MRI. Defecography. This is a test that uses a special liquid to see how the rectum and anus perform during a bowel movement. Colonoscopy. This is an exam that looks at your large intestine (colon). Anal manometry. This is a test that measures the strength of the anal sphincter. Anal electromyogram (EMG). This is a test that uses small electrodes to check for nerve damage. How is this treated? Treatment for this condition depends on the cause and severity. Treatment may also focus on addressing any underlying causes of this condition. Treatment may include: Medicines, such as medicines to: Prevent diarrhea. Help with constipation (bulk-forming laxatives). Treat any underlying conditions. Biofeedback therapy. This can help retrain muscles that are affected. Fiber supplements. These can help manage your bowel movements. Nerve stimulation. An injectable gel to promote tissue growth and better muscle control. Surgery. You may need: Sphincter repair surgery. Diversion surgery. This procedure lets feces pass out of your body through a hole in your abdomen. Follow these instructions at home: Eating and drinking  Follow instructions from your health care provider about what you may eat and drink. Work with a dietitian to help you avoid foods and drinks that can make your condition worse. These include: Spicy foods. Fatty and greasy foods. Cured or smoked meat. Dairy products. Drinks with caffeine in them. Keep a diet diary to find out which foods or drinks could be making your condition worse. Do not drink alcohol. Drink enough fluid to keep your urine pale yellow. Lifestyle Do not use any products that contain nicotine or tobacco. These products include cigarettes, chewing tobacco,  and vaping devices, such as e-cigarettes. If you need help quitting, ask your health care provider. If you are overweight, talk with your health care provider about  how to safely lose weight. This may help your condition. Increase your physical activity as told by your health care provider. This may help your condition. Always talk with your health care provider before starting a new exercise program. Carry a change of clothes and supplies to clean up quickly if you have an episode of fecal incontinence. Consider joining a fecal incontinence support group. You can find a support group online or in your local community. General instructions  Take over-the-counter and prescription medicines only as told by your health care provider. This includes any supplements. Apply a moisture barrier, such as petroleum jelly, to the anus. This protects the skin from irritation caused by ongoing leaking or diarrhea. Tell your health care provider if you are upset or depressed about your condition. Where to find more information [employer]intestinal [employer]: iffgd.org [employer]: patients.gi.org Contact a health care provider if: You have a fever. You have redness, swelling, or pain around your anus. Your pain is getting worse or you lose feeling in your rectal area. You have blood in your stool. You feel sad or hopeless. You avoid social or work situations. Get help right away if: You stop having bowel movements. You cannot eat or drink without vomiting. You have rectal bleeding that does not stop. You have severe pain that is getting worse. You have symptoms of dehydration, including: Drowsiness or tiredness (fatigue). Little or no urine, tears, or sweat. Dizziness. Dry mouth. Unusual irritability. Headache. Inability to think clearly. Summary Fecal incontinence, also called accidental bowel leakage, is an inability to control your bowels. This condition happens because the nerves or muscles around the anus do not work the way they should. Treatment depends on the cause and the severity of the  symptoms. Follow instructions from your health care provider about what you may eat and drink, lifestyle changes, and skin care. Take over-the-counter and prescription medicines only as told by your health care provider. Tell your health care provider if your symptoms worsen or if you are upset or depressed about your condition. This information is not intended to replace advice given to you by your health care provider. Make sure you discuss any questions you have with your health care provider. Document Revised: 04/01/2021 Document Reviewed: 04/01/2021 Elsevier Patient Education  2024 [employer]  Health Maintenance, Female Adopting a healthy lifestyle and getting preventive care are important in promoting health and wellness. Ask your health care provider about: The right schedule for you to have regular tests and exams. Things you can do on your own to prevent diseases and keep yourself healthy. What should I know about diet, weight, and exercise? Eat a healthy diet  Eat a diet that includes plenty of vegetables, fruits, low-fat dairy products, and lean protein. Do not eat a lot of foods that are high in solid fats, added sugars, or sodium. Maintain a healthy weight Body mass index (BMI) is used to identify weight problems. It estimates body fat based on height and weight. Your health care provider can help determine your BMI and help you achieve or maintain a healthy weight. Get regular exercise Get regular exercise. This is one of the most important things you can do for your health. Most adults should: Exercise for at least 150 minutes each week. The exercise should increase your heart  rate and make you sweat (moderate-intensity exercise). Do strengthening exercises at least twice a week. This is in addition to the moderate-intensity exercise. Spend less time sitting. Even light physical activity can be beneficial. Watch cholesterol and blood lipids Have your blood tested for  lipids and cholesterol at 70 years of age, then have this test every 5 years. Have your cholesterol levels checked more often if: Your lipid or cholesterol levels are high. You are older than 70 years of age. You are at high risk for heart disease. What should I know about cancer screening? Depending on your health history and family history, you may need to have cancer screening at various ages. This may include screening for: Breast cancer. Cervical cancer. Colorectal cancer. Skin cancer. Lung cancer. What should I know about heart disease, diabetes, and high blood pressure? Blood pressure and heart disease High blood pressure causes heart disease and increases the risk of stroke. This is more likely to develop in people who have high blood pressure readings or are overweight. Have your blood pressure checked: Every 3-5 years if you are 73-2 years of age. Every year if you are 62 years old or older. Diabetes Have regular diabetes screenings. This checks your fasting blood sugar level. Have the screening done: Once every three years after age 48 if you are at a normal weight and have a low risk for diabetes. More often and at a younger age if you are overweight or have a high risk for diabetes. What should I know about preventing infection? Hepatitis B If you have a higher risk for hepatitis B, you should be screened for this virus. Talk with your health care provider to find out if you are at risk for hepatitis B infection. Hepatitis C Testing is recommended for: Everyone born from [birth year] through 1967. Anyone with known risk factors for hepatitis C. Sexually transmitted infections (STIs) Get screened for STIs, including gonorrhea and chlamydia, if: You are sexually active and are younger than 70 years of age. You are older than 70 years of age and your health care provider tells you that you are at risk for this type of infection. Your sexual activity has changed since you were last  screened, and you are at increased risk for chlamydia or gonorrhea. Ask your health care provider if you are at risk. Ask your health care provider about whether you are at high risk for HIV. Your health care provider may recommend a prescription medicine to help prevent HIV infection. If you choose to take medicine to prevent HIV, you should first get tested for HIV. You should then be tested every 3 months for as long as you are taking the medicine. Pregnancy If you are about to stop having your period (premenopausal) and you may become pregnant, seek counseling before you get pregnant. Take 400 to 800 micrograms (mcg) of folic acid every day if you become pregnant. Ask for birth control (contraception) if you want to prevent pregnancy. Osteoporosis and menopause Osteoporosis is a disease in which the bones lose minerals and strength with aging. This can result in bone fractures. If you are 67 years old or older, or if you are at risk for osteoporosis and fractures, ask your health care provider if you should: Be screened for bone loss. Take a calcium  or vitamin D  supplement to lower your risk of fractures. Be given hormone replacement therapy (HRT) to treat symptoms of menopause. Follow these instructions at home: Alcohol use Do not drink alcohol if:  Your health care provider tells you not to drink. You are pregnant, may be pregnant, or are planning to become pregnant. If you drink alcohol: Limit how much you have to: 0-1 drink a day. Know how much alcohol is in your drink. In the [place] one drink equals one 12 oz bottle of beer (355 mL), one 5 oz glass of wine (148 mL), or one 1 oz glass of hard liquor (44 mL). Lifestyle Do not use any products that contain nicotine or tobacco. These products include cigarettes, chewing tobacco, and vaping devices, such as e-cigarettes. If you need help quitting, ask your health care provider. Do not use street drugs. Do not share needles. Ask your health  care provider for help if you need support or information about quitting drugs. General instructions Schedule regular health, dental, and eye exams. Stay current with your vaccines. Tell your health care provider if: You often feel depressed. You have ever been abused or do not feel safe at home. Summary Adopting a healthy lifestyle and getting preventive care are important in promoting health and wellness. Follow your health care provider's instructions about healthy diet, exercising, and getting tested or screened for diseases. Follow your health care provider's instructions on monitoring your cholesterol and blood pressure. This information is not intended to replace advice given to you by your health care provider. Make sure you discuss any questions you have with your health care provider. Document Revised: 05/27/2020 Document Reviewed: 05/27/2020 Elsevier Patient Education  2024 [employer].

## 2023-12-27 ENCOUNTER — Ambulatory Visit: Payer: Medicare PPO | Admitting: Internal Medicine

## 2023-12-27 ENCOUNTER — Encounter: Payer: Self-pay | Admitting: Internal Medicine

## 2023-12-27 VITALS — BP 130/82 | HR 83 | Temp 98.4°F | Ht 61.0 in | Wt 182.4 lb

## 2023-12-27 DIAGNOSIS — I700 Atherosclerosis of aorta: Secondary | ICD-10-CM

## 2023-12-27 DIAGNOSIS — E785 Hyperlipidemia, unspecified: Secondary | ICD-10-CM | POA: Diagnosis not present

## 2023-12-27 DIAGNOSIS — I119 Hypertensive heart disease without heart failure: Secondary | ICD-10-CM

## 2023-12-27 DIAGNOSIS — M791 Myalgia, unspecified site: Secondary | ICD-10-CM

## 2023-12-27 DIAGNOSIS — Z0000 Encounter for general adult medical examination without abnormal findings: Secondary | ICD-10-CM

## 2023-12-27 DIAGNOSIS — F419 Anxiety disorder, unspecified: Secondary | ICD-10-CM | POA: Insufficient documentation

## 2023-12-27 DIAGNOSIS — E1169 Type 2 diabetes mellitus with other specified complication: Secondary | ICD-10-CM | POA: Diagnosis not present

## 2023-12-27 LAB — POCT URINALYSIS DIPSTICK
Bilirubin, UA: NEGATIVE
Glucose, UA: NEGATIVE
Ketones, UA: NEGATIVE
Nitrite, UA: NEGATIVE
Protein, UA: NEGATIVE
Spec Grav, UA: 1.025 (ref 1.010–1.025)
Urobilinogen, UA: 1 U/dL
pH, UA: 6 (ref 5.0–8.0)

## 2023-12-27 NOTE — Assessment & Plan Note (Addendum)
 Chronic, diabetic foot exam was performed. Type 2 diabetes with controlled fasting glucose but elevated postprandial levels. Managed with Ozempic . Dietary indiscretion noted. Concerns about medication affordability due to changes in assistance programs. - Continue Ozempic . - Encourage dietary modifications. - Order A1c test.

## 2023-12-27 NOTE — Assessment & Plan Note (Signed)
Chronic, LDL goal is less than 70.  She will continue with rosuvastatin 20mg  daily. Encouraged to follow heart healthy lifestyle.

## 2023-12-27 NOTE — Assessment & Plan Note (Addendum)
 Chronic, fair control. Goal BP <130/80.  EKG performed, NSR w/o acute changes.  She will continue with amlodipine , carvedilol  and valsartan  daily. Encouraged to follow low sodium diet.  - Refill amlodipine  90-day supply. - Continue carvedilol  and valsartan . - She will f/u in 4-6 months for re-evaluation.

## 2023-12-27 NOTE — Assessment & Plan Note (Signed)

## 2023-12-27 NOTE — Progress Notes (Unsigned)
 I,Whitney Sanchez, CMA,acting as a neurosurgeon for Whitney LOISE Slocumb, MD.,have documented all relevant documentation on the behalf of Whitney LOISE Slocumb, MD,as directed by  Whitney LOISE Slocumb, MD while in the presence of Whitney LOISE Slocumb, MD.  Subjective:    Patient ID: Whitney Sanchez , female    DOB: 09-04-1955 , 68 y.o.   MRN: [id number]  Chief Complaint  Patient presents with   Annual Exam    Patient presents today for annual exam. She reports compliance with medications. Denies headache, chest pain & sob. She is not currently est with GYN. She has no specific questions or concerns.    Diabetes   Hypertension    HPI Discussed the use of AI scribe software for clinical note transcription with the patient, who gave verbal consent to proceed.  History of Present Illness Whitney Sanchez is a 68 year old female with hypertension and diabetes who presents for a routine physical exam and medication review.  She has a history of hypertension and diabetes. She usually walks around the neighborhood for exercise, but with the colder weather, she plans to use her treadmill at home.  Her current medications include aspirin , carvedilol  twice daily, duloxetine  30 mg daily, midodrine 30 mg daily, isosorbide , rosuvastatin  20 mg, Ozempic  1 mg weekly, valsartan  160 mg, and amlodipine  5 mg daily. She is concerned about the cost of Ozempic  due to changes in patient assistance programs, noting that her share might be $100, though she is unsure if this is monthly or for a three-month supply.  She experiences some soreness behind her ear, which she attributes to muscle tightness. She reports bowel movements that are regular but sometimes mushy, and she experiences occasional fecal incontinence, which she suspects might be related to a previous hemorrhoid surgery. She is considering pelvic floor exercises.  She eats vegetables regularly, although her husband is less inclined to do so. She performs self-breast exams  and had a manual exam recently. Her uncle had a similar procedure years ago.   Hypertension This is a chronic problem. The current episode started more than 1 year ago. The problem has been gradually improving since onset. The problem is uncontrolled. Pertinent negatives include no blurred vision or chest pain. Risk factors for coronary artery disease include dyslipidemia and diabetes mellitus. The current treatment provides moderate improvement.  Diabetes She presents for her follow-up diabetic visit. She has type 2 diabetes mellitus. Pertinent negatives for diabetes include no blurred vision and no chest pain. There are no hypoglycemic complications. Risk factors for coronary artery disease include diabetes mellitus, dyslipidemia, hypertension, sedentary lifestyle, post-menopausal and obesity. An ACE inhibitor/angiotensin II receptor blocker is being taken.     Past Medical History:  Diagnosis Date   Anemia    Anxiety    Arthritis    Depression    DM (diabetes mellitus) (HCC)    High cholesterol    HTN (hypertension)    IBS (irritable bowel syndrome)      Family History  Problem Relation Age of Onset   Diabetes Mother    Healthy Father    Breast cancer Neg Hx    Neuropathy Neg Hx    Colon cancer Neg Hx    Esophageal cancer Neg Hx    Rectal cancer Neg Hx    Stomach cancer Neg Hx    Colon polyps Neg Hx      Current Outpatient Medications:    amLODipine  (NORVASC ) 5 MG tablet, Take 1 tablet (5 mg total) by  mouth at bedtime., Disp: 90 tablet, Rfl: 2   aspirin  EC 81 MG tablet, Take 1 tablet (81 mg total) by mouth daily. Swallow whole., Disp: 90 tablet, Rfl: 1   blood glucose meter kit and supplies KIT, Dispense based on patient and insurance preference. Use up to four times daily as directed. Dx code e11.65, Disp: 1 each, Rfl: 4   carvedilol  (COREG ) 6.25 MG tablet, Take 1 tablet (6.25 mg total) by mouth 2 (two) times daily with a meal., Disp: 180 tablet, Rfl: 3   diclofenac  Sodium  (VOLTAREN ) 1 % GEL, Apply 2 g topically 4 (four) times daily. As needed, Disp: 100 g, Rfl: 0   DULoxetine  (CYMBALTA ) 30 MG capsule, Take 1 capsule (30 mg total) by mouth daily., Disp: 90 capsule, Rfl: 1   hydrocortisone  (ANUSOL -HC) 25 MG suppository, Place 1 suppository (25 mg total) rectally at bedtime., Disp: 12 suppository, Rfl: 1   isosorbide  mononitrate (IMDUR ) 30 MG 24 hr tablet, Take 1 tablet (30 mg total) by mouth daily., Disp: 90 tablet, Rfl: 3   meloxicam  (MOBIC ) 7.5 MG tablet, TAKE 1 TABLET BY MOUTH DAILY AS NEEDED FOR PAIN, Disp: 30 tablet, Rfl: 0   nitroGLYCERIN  (NITROSTAT ) 0.4 MG SL tablet, Place 1 tablet (0.4 mg total) under the tongue every 5 (five) minutes x 3 doses as needed for chest pain. If no relief after 3 doses, call 911., Disp: 25 tablet, Rfl: 3   ondansetron  (ZOFRAN ) 4 MG tablet, Take 1 tablet (4 mg total) by mouth as directed. Take one Zofran  4 mg tablet 30-60 minutes before each colonoscopy prep dose, Disp: 6 tablet, Rfl: 0   rosuvastatin  (CRESTOR ) 20 MG tablet, Take 1 tablet (20 mg total) by mouth daily., Disp: 90 tablet, Rfl: 3   Semaglutide , 1 MG/DOSE, (OZEMPIC , 1 MG/DOSE,) 4 MG/3ML SOPN, Inject 1 mg into the skin once a week. Please bill to the free trial coupon, Disp: 3 mL, Rfl: 1   valsartan  (DIOVAN ) 160 MG tablet, Take 1 tablet (160 mg total) by mouth daily., Disp: 90 tablet, Rfl: 2   FERROUS SULFATE PO, Take 65 mg by mouth daily. (Patient not taking: Reported on 12/27/2023), Disp: , Rfl:   Current Facility-Administered Medications:    0.9 %  sodium chloride  infusion, 500 mL, Intravenous, Once, Nandigam, Kavitha V, MD   No Known Allergies    The patient states she uses none for birth control. No LMP recorded. Patient has had a hysterectomy.. Negative for Dysmenorrhea. Negative for: breast discharge, breast lump(s), breast pain and breast self exam. Associated symptoms include abnormal vaginal bleeding. Pertinent negatives include abnormal bleeding (hematology),  anxiety, decreased libido, depression, difficulty falling sleep, dyspareunia, history of infertility, nocturia, sexual dysfunction, sleep disturbances, urinary incontinence, urinary urgency, vaginal discharge and vaginal itching. Diet regular.The patient states her exercise level is  intermittent.  . The patient's tobacco use is:  Social History   Tobacco Use  Smoking Status Never   Passive exposure: Never  Smokeless Tobacco Never  . She has been exposed to passive smoke. The patient's alcohol use is:  Social History   Substance and Sexual Activity  Alcohol Use Never      Review of Systems  Constitutional: Negative.   HENT: Negative.    Eyes: Negative.  Negative for blurred vision.  Respiratory: Negative.    Cardiovascular: Negative.  Negative for chest pain.  Gastrointestinal: Negative.   Endocrine: Negative.   Genitourinary: Negative.   Musculoskeletal: Negative.   Skin: Negative.   Allergic/Immunologic: Negative.  Neurological: Negative.   Hematological: Negative.   Psychiatric/Behavioral: Negative.       Today's Vitals   12/27/23 0904  BP: 130/82  Pulse: 83  Temp: 98.4 F (36.9 C)  SpO2: 98%  Weight: 182 lb 6.4 oz (82.7 kg)  Height: 5' 1 (1.549 m)   Body mass index is 34.46 kg/m.  Wt Readings from Last 3 Encounters:  12/27/23 182 lb 6.4 oz (82.7 kg)  09/28/23 180 lb (81.6 kg)  08/30/23 180 lb 6.4 oz (81.8 kg)     Objective:  Physical Exam Vitals and nursing note reviewed. Exam conducted with a chaperone present.  Constitutional:      Appearance: Normal appearance. She is obese.  HENT:     Head: Normocephalic and atraumatic.     Right Ear: Tympanic membrane, ear canal and external ear normal.     Left Ear: Tympanic membrane, ear canal and external ear normal.     Nose: Nose normal.     Mouth/Throat:     Mouth: Mucous membranes are moist.     Pharynx: Oropharynx is clear.  Eyes:     Extraocular Movements: Extraocular movements intact.      Conjunctiva/sclera: Conjunctivae normal.     Pupils: Pupils are equal, round, and reactive to light.  Cardiovascular:     Rate and Rhythm: Normal rate and regular rhythm.     Pulses: Normal pulses.          Dorsalis pedis pulses are 2+ on the right side and 2+ on the left side.     Heart sounds: Normal heart sounds.  Pulmonary:     Effort: Pulmonary effort is normal.     Breath sounds: Normal breath sounds.  Chest:  Breasts:    Tanner Score is 5.     Right: Normal.     Left: Normal.  Abdominal:     General: Bowel sounds are normal.     Palpations: Abdomen is soft.  Genitourinary:    Rectum: Guaiac result negative. External hemorrhoid present.     Comments: Rectal exam performed, scant stool in vault. Hemoccult neg. Ext hemorrhoid noted. Musculoskeletal:        General: Normal range of motion.     Cervical back: Normal range of motion and neck supple.  Feet:     Right foot:     Protective Sensation: 5 sites tested.  5 sites sensed.     Skin integrity: Dry skin present.     Toenail Condition: Right toenails are normal.     Left foot:     Protective Sensation: 5 sites tested.  5 sites sensed.     Skin integrity: Dry skin present.     Toenail Condition: Left toenails are normal.  Skin:    General: Skin is warm and dry.  Neurological:     General: No focal deficit present.     Mental Status: She is alert and oriented to person, place, and time.  Psychiatric:        Mood and Affect: Mood normal.        Behavior: Behavior normal.       Assessment And Plan:     Annual physical exam Assessment & Plan: A full exam was performed.  Importance of monthly self breast exams was discussed with the patient.  She is advised to get 30-45 minutes of regular exercise, no less than four to five days per week. Both weight-bearing and aerobic exercises are recommended.  She is advised to follow a  healthy diet with at least six fruits/veggies per day, decrease intake of red meat and other  saturated fats and to increase fish intake to twice weekly.  Meats/fish should not be fried -- baked, boiled or broiled is preferable. It is also important to cut back on your sugar intake.  Be sure to read labels - try to avoid anything with added sugar, high fructose corn syrup or other sweeteners.  If you must use a sweetener, you can try stevia or monkfruit.  It is also important to avoid artificially sweetened foods/beverages and diet drinks. Lastly, wear SPF 50 sunscreen on exposed skin and when in direct sunlight for an extended period of time.  Be sure to avoid fast food restaurants and aim for at least 60 ounces of water daily.       Dyslipidemia associated with type 2 diabetes mellitus (HCC) Assessment & Plan: Chronic, diabetic foot exam was performed. Type 2 diabetes with controlled fasting glucose but elevated postprandial levels. Managed with Ozempic . Dietary indiscretion noted. Concerns about medication affordability due to changes in assistance programs. - Continue Ozempic . - Encourage dietary modifications. - Order A1c test.   Orders: -     CBC -     CMP14+EGFR -     Lipid panel -     Hemoglobin A1c -     POCT urinalysis dipstick -     Microalbumin / creatinine urine ratio -     EKG 12-Lead  Hypertensive heart disease without heart failure Assessment & Plan: Chronic, fair control. Goal BP <130/80.  EKG performed, NSR w/o acute changes.  She will continue with amlodipine , carvedilol  and valsartan  daily. Encouraged to follow low sodium diet.  - Refill amlodipine  90-day supply. - Continue carvedilol  and valsartan . - She will f/u in 4-6 months for re-evaluation.  Orders: -     CMP14+EGFR -     Lipid panel -     POCT urinalysis dipstick -     Microalbumin / creatinine urine ratio -     EKG 12-Lead  Aortic atherosclerosis Assessment & Plan: Chronic, LDL goal is less than 70.  She will continue with rosuvastatin  20mg  daily. Encouraged to follow heart healthy lifestyle.     Incontinence of feces, unspecified fecal incontinence type Assessment & Plan: Possibly related to recent surgery. Stools are mushy with occasional leakage. - Perform pelvic floor exercises to improve fecal incontinence. - Consider physical therapy for pelvic floor exercises if symptoms persist. - Maintain regular bowel movements and consider stool softeners if needed.    Return for 1 year HM, 4 month dm f/u.SABRA Patient was given opportunity to ask questions. Patient verbalized understanding of the plan and was able to repeat key elements of the plan. All questions were answered to their satisfaction.   I, Whitney LOISE Slocumb, MD, have reviewed all documentation for this visit. The documentation on 12/27/23 for the exam, diagnosis, procedures, and orders are all accurate and complete.

## 2023-12-28 ENCOUNTER — Other Ambulatory Visit: Payer: Self-pay

## 2023-12-28 LAB — CMP14+EGFR
ALT: 16 IU/L (ref 0–32)
AST: 21 IU/L (ref 0–40)
Albumin: 4.4 g/dL (ref 3.9–4.9)
Alkaline Phosphatase: 71 IU/L (ref 49–135)
BUN/Creatinine Ratio: 23 (ref 12–28)
BUN: 16 mg/dL (ref 8–27)
Bilirubin Total: 0.4 mg/dL (ref 0.0–1.2)
CO2: 24 mmol/L (ref 20–29)
Calcium: 9.6 mg/dL (ref 8.7–10.3)
Chloride: 105 mmol/L (ref 96–106)
Creatinine, Ser: 0.7 mg/dL (ref 0.57–1.00)
Globulin, Total: 2.6 g/dL (ref 1.5–4.5)
Glucose: 98 mg/dL (ref 70–99)
Potassium: 4.6 mmol/L (ref 3.5–5.2)
Sodium: 141 mmol/L (ref 134–144)
Total Protein: 7 g/dL (ref 6.0–8.5)
eGFR: 93 mL/min/1.73 (ref >59)

## 2023-12-28 LAB — CBC
Hematocrit: 37.2 % (ref 34.0–46.6)
Hemoglobin: 12 g/dL (ref 11.1–15.9)
MCH: 28.6 pg (ref 26.6–33.0)
MCHC: 32.3 g/dL (ref 31.5–35.7)
MCV: 89 fL (ref 79–97)
Platelets: 274 x10E3/uL (ref 150–450)
RBC: 4.2 x10E6/uL (ref 3.77–5.28)
RDW: 13.9 % (ref 11.7–15.4)
WBC: 5.8 x10E3/uL (ref 3.4–10.8)

## 2023-12-28 LAB — LIPID PANEL
Chol/HDL Ratio: 2.4 ratio (ref 0.0–4.4)
Cholesterol, Total: 150 mg/dL (ref 100–199)
HDL: 63 mg/dL (ref >39)
LDL Chol Calc (NIH): 60 mg/dL (ref 0–99)
Triglycerides: 160 mg/dL — ABNORMAL HIGH (ref 0–149)
VLDL Cholesterol Cal: 27 mg/dL (ref 5–40)

## 2023-12-28 LAB — MICROALBUMIN / CREATININE URINE RATIO
Creatinine, Urine: 129.4 mg/dL
Microalb/Creat Ratio: 17 mg/g{creat} (ref 0–29)
Microalbumin, Urine: 22.3 ug/mL

## 2023-12-28 LAB — HEMOGLOBIN A1C
Est. average glucose Bld gHb Est-mCnc: 111 mg/dL
Hgb A1c MFr Bld: 5.5 % (ref 4.8–5.6)

## 2023-12-29 ENCOUNTER — Other Ambulatory Visit (HOSPITAL_COMMUNITY): Payer: Self-pay

## 2024-01-01 DIAGNOSIS — R159 Full incontinence of feces: Secondary | ICD-10-CM | POA: Insufficient documentation

## 2024-01-01 NOTE — Assessment & Plan Note (Signed)
 Possibly related to recent surgery. Stools are mushy with occasional leakage. - Perform pelvic floor exercises to improve fecal incontinence. - Consider physical therapy for pelvic floor exercises if symptoms persist. - Maintain regular bowel movements and consider stool softeners if needed.

## 2024-01-18 ENCOUNTER — Ambulatory Visit: Payer: Self-pay | Admitting: Internal Medicine

## 2024-01-26 ENCOUNTER — Other Ambulatory Visit: Payer: Self-pay | Admitting: Internal Medicine

## 2024-01-26 ENCOUNTER — Ambulatory Visit: Payer: Self-pay | Admitting: Pharmacist

## 2024-01-26 ENCOUNTER — Other Ambulatory Visit (HOSPITAL_COMMUNITY): Payer: Self-pay

## 2024-01-26 ENCOUNTER — Other Ambulatory Visit: Payer: Self-pay

## 2024-01-26 DIAGNOSIS — F419 Anxiety disorder, unspecified: Secondary | ICD-10-CM

## 2024-01-26 NOTE — Progress Notes (Signed)
 01/07 - Got message from [facility] [employer] called and stated to fill for 100 day supply. They had patien permission to do so.  I explained we could not do a 100 day supply and to [employer] packaging.  She stated the insurance and pateint want 100 day supply and to fill for 100 day.   I called the patient and was informed by pt that insurance called her and said she had to get 100 days supply. I informed her that we can do 30 days at a time for [employer]. Pt wanted to stay in the packaging, so I let her know if insurance calls - to let them know she gets packaging and wants to do 30 DS at a time   -Genevia Bouton, PharmD

## 2024-01-27 MED ORDER — DULOXETINE HCL 30 MG PO CPEP
30.0000 mg | ORAL_CAPSULE | Freq: Every day | ORAL | 1 refills | Status: AC
  Filled 2024-01-27: qty 30, 30d supply, fill #0
  Filled 2024-02-25: qty 30, 30d supply, fill #1

## 2024-01-28 ENCOUNTER — Other Ambulatory Visit: Payer: Self-pay

## 2024-01-31 ENCOUNTER — Other Ambulatory Visit (HOSPITAL_COMMUNITY): Payer: Self-pay

## 2024-01-31 ENCOUNTER — Other Ambulatory Visit: Payer: Self-pay | Admitting: Internal Medicine

## 2024-01-31 DIAGNOSIS — R3129 Other microscopic hematuria: Secondary | ICD-10-CM

## 2024-02-01 ENCOUNTER — Ambulatory Visit: Payer: Self-pay | Admitting: Pharmacist

## 2024-02-01 ENCOUNTER — Other Ambulatory Visit: Payer: Self-pay

## 2024-02-16 ENCOUNTER — Other Ambulatory Visit

## 2024-02-25 ENCOUNTER — Other Ambulatory Visit: Payer: Self-pay

## 2024-05-03 ENCOUNTER — Ambulatory Visit: Payer: Self-pay

## 2024-05-03 ENCOUNTER — Ambulatory Visit

## 2024-05-17 ENCOUNTER — Ambulatory Visit: Admitting: Internal Medicine

## 2024-12-28 ENCOUNTER — Encounter: Admitting: Internal Medicine
